# Patient Record
Sex: Female | Born: 1965 | Race: Black or African American | Hispanic: No | Marital: Married | Smoking: Former smoker
Health system: Southern US, Community
[De-identification: ages and names within clinical notes are randomized; demographics above are authoritative.]

## PROBLEM LIST (undated history)

## (undated) ENCOUNTER — Ambulatory Visit: Admission: EM

## (undated) DIAGNOSIS — R7303 Prediabetes: Secondary | ICD-10-CM

## (undated) DIAGNOSIS — R51 Headache: Secondary | ICD-10-CM

## (undated) DIAGNOSIS — M5030 Other cervical disc degeneration, unspecified cervical region: Secondary | ICD-10-CM

## (undated) DIAGNOSIS — L309 Dermatitis, unspecified: Secondary | ICD-10-CM

## (undated) DIAGNOSIS — D649 Anemia, unspecified: Secondary | ICD-10-CM

## (undated) DIAGNOSIS — Z9884 Bariatric surgery status: Secondary | ICD-10-CM

## (undated) DIAGNOSIS — G4730 Sleep apnea, unspecified: Secondary | ICD-10-CM

## (undated) DIAGNOSIS — T7840XA Allergy, unspecified, initial encounter: Secondary | ICD-10-CM

## (undated) DIAGNOSIS — R011 Cardiac murmur, unspecified: Secondary | ICD-10-CM

## (undated) DIAGNOSIS — R519 Headache, unspecified: Secondary | ICD-10-CM

## (undated) DIAGNOSIS — M7542 Impingement syndrome of left shoulder: Secondary | ICD-10-CM

## (undated) DIAGNOSIS — M1990 Unspecified osteoarthritis, unspecified site: Secondary | ICD-10-CM

## (undated) DIAGNOSIS — E559 Vitamin D deficiency, unspecified: Secondary | ICD-10-CM

## (undated) DIAGNOSIS — E785 Hyperlipidemia, unspecified: Secondary | ICD-10-CM

## (undated) DIAGNOSIS — M75120 Complete rotator cuff tear or rupture of unspecified shoulder, not specified as traumatic: Secondary | ICD-10-CM

## (undated) HISTORY — DX: Unspecified osteoarthritis, unspecified site: M19.90

## (undated) HISTORY — DX: Headache: R51

## (undated) HISTORY — DX: Bariatric surgery status: Z98.84

## (undated) HISTORY — PX: TONSILLECTOMY: SUR1361

## (undated) HISTORY — DX: Cardiac murmur, unspecified: R01.1

## (undated) HISTORY — PX: GASTRIC BYPASS: SHX52

## (undated) HISTORY — PX: ABDOMINAL HYSTERECTOMY: SHX81

## (undated) HISTORY — DX: Headache, unspecified: R51.9

## (undated) HISTORY — PX: OTHER SURGICAL HISTORY: SHX169

## (undated) HISTORY — DX: Allergy, unspecified, initial encounter: T78.40XA

---

## 2001-12-31 HISTORY — PX: GASTRIC BYPASS: SHX52

## 2005-06-28 ENCOUNTER — Other Ambulatory Visit: Payer: Self-pay

## 2005-06-28 ENCOUNTER — Emergency Department: Payer: Self-pay | Admitting: Emergency Medicine

## 2007-05-14 ENCOUNTER — Ambulatory Visit: Payer: Self-pay | Admitting: Internal Medicine

## 2007-07-23 ENCOUNTER — Ambulatory Visit: Payer: Self-pay | Admitting: Internal Medicine

## 2008-05-31 ENCOUNTER — Ambulatory Visit: Payer: Self-pay | Admitting: Unknown Physician Specialty

## 2008-06-10 ENCOUNTER — Ambulatory Visit: Payer: Self-pay | Admitting: Unknown Physician Specialty

## 2008-10-10 ENCOUNTER — Emergency Department: Payer: Self-pay | Admitting: Emergency Medicine

## 2009-08-04 ENCOUNTER — Ambulatory Visit: Payer: Self-pay | Admitting: Unknown Physician Specialty

## 2009-08-09 ENCOUNTER — Ambulatory Visit: Payer: Self-pay | Admitting: Unknown Physician Specialty

## 2010-02-16 ENCOUNTER — Ambulatory Visit: Payer: Self-pay | Admitting: Unknown Physician Specialty

## 2010-05-08 ENCOUNTER — Ambulatory Visit: Payer: Self-pay | Admitting: Rheumatology

## 2010-08-09 ENCOUNTER — Ambulatory Visit: Payer: Self-pay | Admitting: Unknown Physician Specialty

## 2011-02-12 ENCOUNTER — Ambulatory Visit: Payer: Self-pay | Admitting: General Surgery

## 2011-02-15 ENCOUNTER — Ambulatory Visit: Payer: Self-pay | Admitting: General Surgery

## 2011-07-09 ENCOUNTER — Emergency Department: Payer: Self-pay | Admitting: Emergency Medicine

## 2011-08-21 ENCOUNTER — Ambulatory Visit: Payer: Self-pay | Admitting: General Surgery

## 2012-02-11 ENCOUNTER — Ambulatory Visit (INDEPENDENT_AMBULATORY_CARE_PROVIDER_SITE_OTHER): Payer: BC Managed Care – PPO | Admitting: Family Medicine

## 2012-02-11 ENCOUNTER — Encounter: Payer: Self-pay | Admitting: Family Medicine

## 2012-02-11 VITALS — BP 120/72 | HR 99 | Temp 98.6°F | Ht 67.0 in | Wt 276.0 lb

## 2012-02-11 DIAGNOSIS — Z9884 Bariatric surgery status: Secondary | ICD-10-CM

## 2012-02-11 DIAGNOSIS — M542 Cervicalgia: Secondary | ICD-10-CM

## 2012-02-11 DIAGNOSIS — M549 Dorsalgia, unspecified: Secondary | ICD-10-CM

## 2012-02-11 MED ORDER — TRAMADOL HCL 50 MG PO TABS: 50.0000 mg | ORAL_TABLET | Freq: Four times a day (QID) | ORAL | Status: AC | PRN

## 2012-02-11 MED ORDER — AMITRIPTYLINE HCL 50 MG PO TABS: 50.0000 mg | ORAL_TABLET | Freq: Every day | ORAL | Status: DC

## 2012-02-11 MED ORDER — PREDNISONE 10 MG PO TABS: ORAL_TABLET | ORAL | Status: DC

## 2012-02-11 MED ORDER — TIZANIDINE HCL 4 MG PO TABS: 4.0000 mg | ORAL_TABLET | Freq: Every evening | ORAL | Status: AC

## 2012-02-11 NOTE — Patient Instructions (Signed)
REFERRAL: GO THE THE FRONT ROOM AT THE ENTRANCE OF OUR CLINIC, NEAR CHECK IN. ASK FOR MARION. SHE WILL HELP YOU SET UP YOUR REFERRAL. DATE: TIME:  
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
## 2012-02-12 ENCOUNTER — Encounter: Payer: Self-pay | Admitting: Family Medicine

## 2012-02-12 DIAGNOSIS — M542 Cervicalgia: Secondary | ICD-10-CM | POA: Insufficient documentation

## 2012-02-12 DIAGNOSIS — M549 Dorsalgia, unspecified: Secondary | ICD-10-CM | POA: Insufficient documentation

## 2012-02-12 DIAGNOSIS — Z9884 Bariatric surgery status: Secondary | ICD-10-CM | POA: Insufficient documentation

## 2012-02-15 ENCOUNTER — Ambulatory Visit: Payer: Self-pay | Admitting: Family Medicine

## 2012-02-18 ENCOUNTER — Encounter: Payer: Self-pay | Admitting: Family Medicine

## 2012-02-20 ENCOUNTER — Other Ambulatory Visit: Payer: Self-pay | Admitting: Family Medicine

## 2012-02-20 DIAGNOSIS — M542 Cervicalgia: Secondary | ICD-10-CM

## 2012-02-20 DIAGNOSIS — M5020 Other cervical disc displacement, unspecified cervical region: Secondary | ICD-10-CM

## 2012-02-26 ENCOUNTER — Other Ambulatory Visit: Payer: Self-pay | Admitting: Neurosurgery

## 2012-03-03 ENCOUNTER — Encounter (HOSPITAL_COMMUNITY): Payer: Self-pay | Admitting: Pharmacy Technician

## 2012-03-04 ENCOUNTER — Encounter (HOSPITAL_COMMUNITY)
Admission: RE | Admit: 2012-03-04 | Discharge: 2012-03-04 | Disposition: A | Discharge status: Home / Self Care | Payer: BC Managed Care – PPO | Source: Ambulatory Visit | Attending: Neurosurgery | Admitting: Neurosurgery

## 2012-03-04 ENCOUNTER — Encounter (HOSPITAL_COMMUNITY): Payer: Self-pay

## 2012-03-04 LAB — BASIC METABOLIC PANEL
BUN: 13 mg/dL (ref 6–23)
CO2: 27 mEq/L (ref 19–32)
Calcium: 9.2 mg/dL (ref 8.4–10.5)
Creatinine, Ser: 0.81 mg/dL (ref 0.50–1.10)
Glucose, Bld: 81 mg/dL (ref 70–99)

## 2012-03-04 LAB — CBC
MCH: 26.9 pg (ref 26.0–34.0)
MCHC: 33.2 g/dL (ref 30.0–36.0)
MCV: 80.9 fL (ref 78.0–100.0)
Platelets: 252 10*3/uL (ref 150–400)
RBC: 4.24 MIL/uL (ref 3.87–5.11)
RDW: 15.4 % (ref 11.5–15.5)

## 2012-03-04 LAB — SURGICAL PCR SCREEN
MRSA, PCR: NEGATIVE
Staphylococcus aureus: NEGATIVE

## 2012-03-04 NOTE — Pre-Procedure Instructions (Signed)
20 Monique Patton  03/04/2012   Your procedure is scheduled on:  MARCH 14  Report to [facility] at 0530 AM.  Call this number if you have problems the morning of surgery: [phone]   Remember:   Do not eat food:After Midnight.  May have clear liquids: up to 4 Hours before arrival.  Clear liquids include soda, tea, black coffee, apple or grape juice, broth.  Take these medicines the morning of surgery with A SIP OF WATER: ULTRAM AS DESIRED   Do not wear jewelry, make-up or nail polish.  Do not wear lotions, powders, or perfumes. You may wear deodorant.  Do not shave 48 hours prior to surgery.  Do not bring valuables to the hospital.  Contacts, dentures or bridgework may not be worn into surgery.  Leave suitcase in the car. After surgery it may be brought to your room.  For patients admitted to the hospital, checkout time is 11:00 AM the day of discharge.   Patients discharged the day of surgery will not be allowed to drive home.  Name and phone number of your driver: FAMILY  Special Instructions: CHG Shower Use Special Wash: 1/2 bottle night before surgery and 1/2 bottle morning of surgery.   Please read over the following fact sheets that you were given: MRSA Information and Surgical Site Infection Prevention   NO ASPIRIN OR BLOOD THINNERS

## 2012-03-12 MED ORDER — VANCOMYCIN HCL 1000 MG IV SOLR
1500.0000 mg | INTRAVENOUS | Status: AC
  Administered 2012-03-13: 1500 mg via INTRAVENOUS
  Filled 2012-03-12: qty 1500

## 2012-03-13 ENCOUNTER — Encounter (HOSPITAL_COMMUNITY)
Admission: RE | Disposition: A | Discharge status: Home / Self Care | Payer: Self-pay | Source: Ambulatory Visit | Attending: Neurosurgery

## 2012-03-13 ENCOUNTER — Encounter (HOSPITAL_COMMUNITY): Payer: Self-pay | Admitting: Critical Care Medicine

## 2012-03-13 ENCOUNTER — Ambulatory Visit (HOSPITAL_COMMUNITY): Payer: BC Managed Care – PPO | Admitting: Critical Care Medicine

## 2012-03-13 ENCOUNTER — Encounter (HOSPITAL_COMMUNITY): Payer: Self-pay | Admitting: *Deleted

## 2012-03-13 ENCOUNTER — Ambulatory Visit (HOSPITAL_COMMUNITY): Payer: BC Managed Care – PPO

## 2012-03-13 ENCOUNTER — Ambulatory Visit (HOSPITAL_COMMUNITY)
Admission: RE | Admit: 2012-03-13 | Discharge: 2012-03-14 | Disposition: A | Discharge status: Home / Self Care | Payer: BC Managed Care – PPO | Source: Ambulatory Visit | Attending: Neurosurgery | Admitting: Neurosurgery

## 2012-03-13 DIAGNOSIS — M5020 Other cervical disc displacement, unspecified cervical region: Secondary | ICD-10-CM | POA: Insufficient documentation

## 2012-03-13 DIAGNOSIS — Z01812 Encounter for preprocedural laboratory examination: Secondary | ICD-10-CM | POA: Insufficient documentation

## 2012-03-13 DIAGNOSIS — M542 Cervicalgia: Secondary | ICD-10-CM

## 2012-03-13 DIAGNOSIS — F17200 Nicotine dependence, unspecified, uncomplicated: Secondary | ICD-10-CM | POA: Insufficient documentation

## 2012-03-13 DIAGNOSIS — M47812 Spondylosis without myelopathy or radiculopathy, cervical region: Secondary | ICD-10-CM | POA: Insufficient documentation

## 2012-03-13 HISTORY — PX: ANTERIOR CERVICAL DECOMP/DISCECTOMY FUSION: SHX1161

## 2012-03-13 SURGERY — ANTERIOR CERVICAL DECOMPRESSION/DISCECTOMY FUSION 2 LEVELS
Anesthesia: General | Wound class: Clean

## 2012-03-13 MED ORDER — LACTATED RINGERS IV SOLN
INTRAVENOUS | Status: DC | PRN
  Administered 2012-03-13 (×2): via INTRAVENOUS

## 2012-03-13 MED ORDER — DEXAMETHASONE SODIUM PHOSPHATE 4 MG/ML IJ SOLN
INTRAMUSCULAR | Status: DC | PRN
  Administered 2012-03-13: 4 mg via INTRAVENOUS

## 2012-03-13 MED ORDER — SODIUM CHLORIDE 0.9 % IJ SOLN: 3.0000 mL | INTRAMUSCULAR | Status: DC | PRN

## 2012-03-13 MED ORDER — BISACODYL 10 MG RE SUPP: 10.0000 mg | Freq: Every day | RECTAL | Status: DC | PRN

## 2012-03-13 MED ORDER — GENTAMICIN IN SALINE 1.6-0.9 MG/ML-% IV SOLN
80.0000 mg | INTRAVENOUS | Status: DC
  Filled 2012-03-13: qty 50

## 2012-03-13 MED ORDER — PHENYLEPHRINE HCL 10 MG/ML IJ SOLN
INTRAMUSCULAR | Status: DC | PRN
Start: 2012-03-13 — End: 2012-03-13
  Administered 2012-03-13: 80 ug via INTRAVENOUS

## 2012-03-13 MED ORDER — THROMBIN 5000 UNITS EX SOLR
CUTANEOUS | Status: DC | PRN
  Administered 2012-03-13: 5000 [IU] via TOPICAL

## 2012-03-13 MED ORDER — DEXTROSE-NACL 5-0.45 % IV SOLN
INTRAVENOUS | Status: DC
  Administered 2012-03-13: 12:00:00 via INTRAVENOUS

## 2012-03-13 MED ORDER — LACTATED RINGERS IV SOLN: INTRAVENOUS | Status: DC

## 2012-03-13 MED ORDER — ZOLPIDEM TARTRATE 5 MG PO TABS: 10.0000 mg | ORAL_TABLET | Freq: Every evening | ORAL | Status: DC | PRN

## 2012-03-13 MED ORDER — SODIUM CHLORIDE 0.9 % IV SOLN
INTRAVENOUS | Status: DC | PRN
  Administered 2012-03-13: 08:00:00 via INTRAVENOUS

## 2012-03-13 MED ORDER — OXYCODONE-ACETAMINOPHEN 5-325 MG PO TABS
1.0000 | ORAL_TABLET | ORAL | Status: DC | PRN
Start: 2012-03-13 — End: 2012-03-14

## 2012-03-13 MED ORDER — VECURONIUM BROMIDE 10 MG IV SOLR
INTRAVENOUS | Status: DC | PRN
  Administered 2012-03-13: 2 mg via INTRAVENOUS
  Administered 2012-03-13: 1 mg via INTRAVENOUS

## 2012-03-13 MED ORDER — KETOROLAC TROMETHAMINE 30 MG/ML IJ SOLN
INTRAMUSCULAR | Status: AC
  Administered 2012-03-13: 30 mg via INTRAVENOUS
  Filled 2012-03-13: qty 1

## 2012-03-13 MED ORDER — ACETAMINOPHEN 650 MG RE SUPP: 650.0000 mg | RECTAL | Status: DC | PRN

## 2012-03-13 MED ORDER — CYCLOBENZAPRINE HCL 10 MG PO TABS
10.0000 mg | ORAL_TABLET | Freq: Three times a day (TID) | ORAL | Status: DC | PRN
  Administered 2012-03-13: 10 mg via ORAL
  Filled 2012-03-13: qty 1

## 2012-03-13 MED ORDER — LIDOCAINE-EPINEPHRINE 1 %-1:100000 IJ SOLN
INTRAMUSCULAR | Status: DC | PRN
  Administered 2012-03-13: 5 mL

## 2012-03-13 MED ORDER — MORPHINE SULFATE 4 MG/ML IJ SOLN: 4.0000 mg | INTRAMUSCULAR | Status: DC | PRN

## 2012-03-13 MED ORDER — BUPIVACAINE HCL (PF) 0.25 % IJ SOLN
INTRAMUSCULAR | Status: DC | PRN
  Administered 2012-03-13: 5 mL

## 2012-03-13 MED ORDER — ALUM & MAG HYDROXIDE-SIMETH 200-200-20 MG/5ML PO SUSP: 30.0000 mL | Freq: Four times a day (QID) | ORAL | Status: DC | PRN

## 2012-03-13 MED ORDER — ROCURONIUM BROMIDE 100 MG/10ML IV SOLN
INTRAVENOUS | Status: DC | PRN
  Administered 2012-03-13: 50 mg via INTRAVENOUS

## 2012-03-13 MED ORDER — SODIUM CHLORIDE 0.9 % IV SOLN
INTRAVENOUS | Status: AC
  Filled 2012-03-13: qty 500

## 2012-03-13 MED ORDER — KETOROLAC TROMETHAMINE 30 MG/ML IJ SOLN
30.0000 mg | Freq: Once | INTRAMUSCULAR | Status: AC
  Administered 2012-03-13: 30 mg via INTRAVENOUS

## 2012-03-13 MED ORDER — MENTHOL 3 MG MT LOZG: 1.0000 | LOZENGE | OROMUCOSAL | Status: DC | PRN

## 2012-03-13 MED ORDER — GENTAMICIN IN SALINE 1.6-0.9 MG/ML-% IV SOLN: 80.0000 mg | INTRAVENOUS | Status: DC

## 2012-03-13 MED ORDER — BACITRACIN 50000 UNITS IM SOLR
INTRAMUSCULAR | Status: AC
  Filled 2012-03-13: qty 1

## 2012-03-13 MED ORDER — SODIUM CHLORIDE 0.9 % IV SOLN: INTRAVENOUS | Status: DC

## 2012-03-13 MED ORDER — KETOROLAC TROMETHAMINE 30 MG/ML IJ SOLN
30.0000 mg | Freq: Four times a day (QID) | INTRAMUSCULAR | Status: DC
  Administered 2012-03-13 – 2012-03-14 (×3): 30 mg via INTRAVENOUS
  Filled 2012-03-13 (×7): qty 1

## 2012-03-13 MED ORDER — FENTANYL CITRATE 0.05 MG/ML IJ SOLN
INTRAMUSCULAR | Status: DC | PRN
  Administered 2012-03-13 (×3): 50 ug via INTRAVENOUS
  Administered 2012-03-13: 100 ug via INTRAVENOUS
  Administered 2012-03-13: 50 ug via INTRAVENOUS

## 2012-03-13 MED ORDER — ACETAMINOPHEN 325 MG PO TABS: 650.0000 mg | ORAL_TABLET | ORAL | Status: DC | PRN

## 2012-03-13 MED ORDER — HYDROXYZINE HCL 25 MG PO TABS: 50.0000 mg | ORAL_TABLET | ORAL | Status: DC | PRN

## 2012-03-13 MED ORDER — HYDROMORPHONE HCL PF 1 MG/ML IJ SOLN
0.2500 mg | INTRAMUSCULAR | Status: DC | PRN
  Administered 2012-03-13 (×4): 0.5 mg via INTRAVENOUS

## 2012-03-13 MED ORDER — HYDROCODONE-ACETAMINOPHEN 5-325 MG PO TABS
1.0000 | ORAL_TABLET | ORAL | Status: DC | PRN
  Administered 2012-03-13 – 2012-03-14 (×3): 2 via ORAL
  Filled 2012-03-13 (×3): qty 2

## 2012-03-13 MED ORDER — BUTALBITAL-APAP-CAFFEINE 50-325-40 MG PO TABS
1.0000 | ORAL_TABLET | ORAL | Status: DC | PRN
  Filled 2012-03-13: qty 1

## 2012-03-13 MED ORDER — BUTALBITAL-ACETAMINOPHEN 50-650 MG PO TABS: 50.0000 mg | ORAL_TABLET | Freq: Four times a day (QID) | ORAL | Status: DC | PRN

## 2012-03-13 MED ORDER — GLYCOPYRROLATE 0.2 MG/ML IJ SOLN
INTRAMUSCULAR | Status: DC | PRN
  Administered 2012-03-13: .4 mg via INTRAVENOUS

## 2012-03-13 MED ORDER — HEMOSTATIC AGENTS (NO CHARGE) OPTIME
TOPICAL | Status: DC | PRN
  Administered 2012-03-13: 1 via TOPICAL

## 2012-03-13 MED ORDER — ONDANSETRON HCL 4 MG/2ML IJ SOLN
INTRAMUSCULAR | Status: DC | PRN
  Administered 2012-03-13: 4 mg via INTRAVENOUS

## 2012-03-13 MED ORDER — SODIUM CHLORIDE 0.9 % IR SOLN
Status: DC | PRN
  Administered 2012-03-13: 09:00:00

## 2012-03-13 MED ORDER — NEOSTIGMINE METHYLSULFATE 1 MG/ML IJ SOLN
INTRAMUSCULAR | Status: DC | PRN
  Administered 2012-03-13: 3 mg via INTRAVENOUS

## 2012-03-13 MED ORDER — HYDROMORPHONE HCL PF 1 MG/ML IJ SOLN
INTRAMUSCULAR | Status: AC
Start: 2012-03-13 — End: 2012-03-13
  Administered 2012-03-13: 0.5 mg via INTRAVENOUS
  Filled 2012-03-13: qty 1

## 2012-03-13 MED ORDER — SODIUM CHLORIDE 0.9 % IJ SOLN
3.0000 mL | Freq: Two times a day (BID) | INTRAMUSCULAR | Status: DC
  Administered 2012-03-13: 3 mL via INTRAVENOUS

## 2012-03-13 MED ORDER — PHENOL 1.4 % MT LIQD: 1.0000 | OROMUCOSAL | Status: DC | PRN

## 2012-03-13 MED ORDER — MAGNESIUM HYDROXIDE 400 MG/5ML PO SUSP: 30.0000 mL | Freq: Every day | ORAL | Status: DC | PRN

## 2012-03-13 MED ORDER — HYDROXYZINE HCL 50 MG/ML IM SOLN: 50.0000 mg | INTRAMUSCULAR | Status: DC | PRN

## 2012-03-13 MED ORDER — PROPOFOL 10 MG/ML IV EMUL
INTRAVENOUS | Status: DC | PRN
  Administered 2012-03-13: 170 mg via INTRAVENOUS

## 2012-03-13 MED ORDER — LORATADINE 10 MG PO TABS
10.0000 mg | ORAL_TABLET | Freq: Every morning | ORAL | Status: DC
  Administered 2012-03-14: 10 mg via ORAL
  Filled 2012-03-13 (×2): qty 1

## 2012-03-13 MED ORDER — 0.9 % SODIUM CHLORIDE (POUR BTL) OPTIME
TOPICAL | Status: DC | PRN
  Administered 2012-03-13: 1000 mL

## 2012-03-13 MED ORDER — AMITRIPTYLINE HCL 50 MG PO TABS
50.0000 mg | ORAL_TABLET | Freq: Every day | ORAL | Status: DC
  Administered 2012-03-13: 50 mg via ORAL
  Filled 2012-03-13 (×2): qty 1

## 2012-03-13 SURGICAL SUPPLY — 54 items
ALLOGRAFT 7X14X11 (Bone Implant) ×4 IMPLANT
BAG DECANTER FOR FLEXI CONT (MISCELLANEOUS) ×2 IMPLANT
BIT DRILL NEURO 2X3.1 SFT TUCH (MISCELLANEOUS) ×1 IMPLANT
BLADE ULTRA TIP 2M (BLADE) ×2 IMPLANT
BRUSH SCRUB EZ PLAIN DRY (MISCELLANEOUS) ×2 IMPLANT
CANISTER SUCTION 2500CC (MISCELLANEOUS) ×2 IMPLANT
CLOTH BEACON ORANGE TIMEOUT ST (SAFETY) ×2 IMPLANT
CONT SPEC 4OZ CLIKSEAL STRL BL (MISCELLANEOUS) ×2 IMPLANT
COVER MAYO STAND STRL (DRAPES) ×2 IMPLANT
DECANTER SPIKE VIAL GLASS SM (MISCELLANEOUS) ×2 IMPLANT
DERMABOND ADVANCED (GAUZE/BANDAGES/DRESSINGS) ×1
DERMABOND ADVANCED .7 DNX12 (GAUZE/BANDAGES/DRESSINGS) ×1 IMPLANT
DRAPE LAPAROTOMY 100X72 PEDS (DRAPES) ×2 IMPLANT
DRAPE MICROSCOPE LEICA (MISCELLANEOUS) ×2 IMPLANT
DRAPE POUCH INSTRU U-SHP 10X18 (DRAPES) ×2 IMPLANT
DRAPE PROXIMA HALF (DRAPES) IMPLANT
DRILL NEURO 2X3.1 SOFT TOUCH (MISCELLANEOUS) ×2
ELECT COATED BLADE 2.86 ST (ELECTRODE) ×4 IMPLANT
ELECT REM PT RETURN 9FT ADLT (ELECTROSURGICAL) ×2
ELECTRODE REM PT RTRN 9FT ADLT (ELECTROSURGICAL) ×1 IMPLANT
GLOVE BIOGEL PI IND STRL 6.5 (GLOVE) ×1 IMPLANT
GLOVE BIOGEL PI IND STRL 7.0 (GLOVE) ×1 IMPLANT
GLOVE BIOGEL PI IND STRL 8 (GLOVE) ×1 IMPLANT
GLOVE BIOGEL PI INDICATOR 6.5 (GLOVE) ×1
GLOVE BIOGEL PI INDICATOR 7.0 (GLOVE) ×1
GLOVE BIOGEL PI INDICATOR 8 (GLOVE) ×1
GLOVE ECLIPSE 7.5 STRL STRAW (GLOVE) ×2 IMPLANT
GLOVE EXAM NITRILE LRG STRL (GLOVE) IMPLANT
GLOVE EXAM NITRILE MD LF STRL (GLOVE) ×2 IMPLANT
GLOVE EXAM NITRILE XL STR (GLOVE) IMPLANT
GLOVE EXAM NITRILE XS STR PU (GLOVE) IMPLANT
GLOVE SS BIOGEL STRL SZ 6.5 (GLOVE) ×3 IMPLANT
GLOVE SUPERSENSE BIOGEL SZ 6.5 (GLOVE) ×3
GLOVE SURG SS PI 6.5 STRL IVOR (GLOVE) ×2 IMPLANT
GOWN BRE IMP SLV AUR LG STRL (GOWN DISPOSABLE) ×6 IMPLANT
GOWN BRE IMP SLV AUR XL STRL (GOWN DISPOSABLE) ×2 IMPLANT
GOWN STRL REIN 2XL LVL4 (GOWN DISPOSABLE) IMPLANT
HEAD HALTER (SOFTGOODS) ×2 IMPLANT
KIT BASIN OR (CUSTOM PROCEDURE TRAY) ×2 IMPLANT
KIT ROOM TURNOVER OR (KITS) ×2 IMPLANT
NEEDLE HYPO 25X1 1.5 SAFETY (NEEDLE) ×2 IMPLANT
NEEDLE SPNL 22GX3.5 QUINCKE BK (NEEDLE) ×2 IMPLANT
NS IRRIG 1000ML POUR BTL (IV SOLUTION) ×2 IMPLANT
PACK LAMINECTOMY NEURO (CUSTOM PROCEDURE TRAY) ×2 IMPLANT
PAD ARMBOARD 7.5X6 YLW CONV (MISCELLANEOUS) ×6 IMPLANT
RUBBERBAND STERILE (MISCELLANEOUS) ×4 IMPLANT
SPONGE INTESTINAL PEANUT (DISPOSABLE) ×2 IMPLANT
SPONGE SURGIFOAM ABS GEL SZ50 (HEMOSTASIS) ×2 IMPLANT
SUT VIC AB 2-0 CP2 18 (SUTURE) ×2 IMPLANT
SUT VIC AB 3-0 SH 8-18 (SUTURE) ×2 IMPLANT
SYR 20ML ECCENTRIC (SYRINGE) ×2 IMPLANT
TOWEL OR 17X24 6PK STRL BLUE (TOWEL DISPOSABLE) ×2 IMPLANT
TOWEL OR 17X26 10 PK STRL BLUE (TOWEL DISPOSABLE) ×2 IMPLANT
WATER STERILE IRR 1000ML POUR (IV SOLUTION) ×2 IMPLANT

## 2012-03-13 NOTE — Op Note (Signed)
03/13/2012  10:31 AM  PATIENT:  Monique Patton  47 y.o. female  PRE-OPERATIVE DIAGNOSIS:  cervical spondylosis cervical degenerative disc disease cervical hernaited disc neck pain  POST-OPERATIVE DIAGNOSIS:  cervical spondylosis cervical degenerative disc disease cervical hernaited disc neck pain  PROCEDURE:  Procedure(s): ANTERIOR CERVICAL DECOMPRESSION/DISCECTOMY FUSION 2 LEVELS: C5-6 and C6-7 anterior cervical decompression and arthrodesis with allograft and tether cervical plating  SURGEON:  Surgeon(s): Hewitt Shorts, MD Clydene Fake, MD  ASSISTANTS: Clydene Fake, M.D.  ANESTHESIA:   general  EBL:  Total I/O In: 1700 [I.V.:1700] Out: 75 [Blood:75]  BLOOD ADMINISTERED:none  COUNT: Correct per nursing staff  DICTATION: Patient was brought to the operating room placed under general endotracheal anesthesia. Patient was placed in 10 pounds of halter traction. The neck was prepped with Betadine soap and solution and draped in a sterile fashion. A horizontal incision was made on the left side of the neck. The line of the incision was infiltrated with local anesthetic with epinephrine. Dissection was carried down thru the subcutaneous tissue and platysma, bipolar cautery was used to maintain hemostasis. Dissection was then carried out thru an avascular plane leaving the sternocleidomastoid carotid artery and jugular vein laterally and the trachea and esophagus medially. The ventral aspect of the vertebral column was identified and a localizing x-ray was taken. The C5-6 and C6-7 levels were identified. The annulus at each level was incised and the disc space entered. Discectomy was performed with micro-curettes and pituitary rongeurs. The operating microscope was draped and brought into the field provided additional magnification illumination and visualization. Discectomy was continued posteriorly thru the disc space and then the cartilaginous endplate was removed using micro-curettes  along with the high-speed drill. Posterior osteophytic overgrowth was removed each level using the high-speed drill along with a 2 mm thin footplated Kerrison punch. Posterior longitudinal ligament along with disc herniation was carefully removed, decompressing the spinal canal and thecal sac. We then continued to remove osteophytic overgrowth and disc material decompressing the neural foramina and exiting nerve roots bilaterally. Once the decompression was completed hemostasis was established at each level with the use of Gelfoam with thrombin and bipolar cautery. The Gelfoam was removed the wound irrigated and hemostasis confirmed. We then measured the height of the intravertebral disc space level and selected a 7 millimeter in height structural allograft for the C5-6 level and a 7 millimeter in height structural allograft for the C6-7 level . Each was hydrated and saline solution and then gently positioned in the intravertebral disc space and countersunk. We then selected a 29 millimeter in height Tether cervical plate. It was positioned over the fusion construct and secured to the vertebra with 4 x 13 mm screws. Each screw hole was started with the high-speed drill and then the screws placed, once all the screws were placed final tightening was performed. The wound was irrigated with bacitracin solution checked for hemostasis which was established and confirmed. An x-ray was taken which showed grafts in good position, the plate in good position and the screws in good position and the overall alignment was good . We then proceeded with closure. The platysma was closed with interrupted inverted 2-0 undyed Vicryl suture, the subcutaneous and subcuticular closed with interrupted inverted 3-0 undyed Vicryl suture. The skin edges were approximated with Dermabond. Following surgery the patient was taken out of cervical traction. To be reversed and the anesthetic and taken to the recovery room for further  care.   PLAN OF CARE: Admit for  overnight observation  PATIENT DISPOSITION:  PACU - hemodynamically stable.   Delay start of Pharmacological VTE agent (>24hrs) due to surgical blood loss or risk of bleeding:  yes

## 2012-03-13 NOTE — Anesthesia Postprocedure Evaluation (Signed)
  Anesthesia Post-op Note  Patient: Monique Patton  Procedure(s) Performed: Procedure(s) (LRB): ANTERIOR CERVICAL DECOMPRESSION/DISCECTOMY FUSION 2 LEVELS (N/A)  Patient Location: PACU  Anesthesia Type: General  Level of Consciousness: awake  Airway and Oxygen Therapy: Patient Spontanous Breathing  Post-op Pain: mild  Post-op Assessment: Post-op Vital signs reviewed  Post-op Vital Signs: stable  Complications: No apparent anesthesia complications

## 2012-03-13 NOTE — H&P (Signed)
Subjective: Patient is a 47 y.o. female who is admitted for treatment of multilevel cervical disc herniations, spondylosis, and degenerative disc disease, with resulting neck pain extending into the shoulders and interscapular region bilaterally. Symptoms date back to July 2012. She's undergone extensive nonsurgical management without relief or improvement and is admitted now for a 2 level C5-6 and C6-7 ACDF.    Patient Active Problem List  Diagnoses Date Noted  . 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 Neck pain 02/12/2012  . Back pain 02/12/2012  . H/O gastric bypass    Past Medical History  Diagnosis Date  . Arthritis   . Persistent headaches   . Allergy   . H/O gastric bypass     Past Surgical History  Procedure Date  . Gastric bypass   . Abdominal hysterectomy   . Adenidectomy   . Tonsillectomy     Prescriptions prior to admission  Medication Sig Dispense Refill  . amitriptyline (ELAVIL) 50 MG tablet Take 1 tablet (50 mg total) by mouth at bedtime.  30 tablet  3  . BUPAP 50-650 MG TABS Take 50-650 mg by mouth Every 6 hours as needed. For headache      . loratadine (CLARITIN) 10 MG tablet Take 10 mg by mouth every morning.      Marland Kitchen tiZANidine (ZANAFLEX) 4 MG tablet Take 4 mg by mouth at bedtime.      . traMADol (ULTRAM) 50 MG tablet Take 50 mg by mouth every 6 (six) hours as needed. For pain       Allergies  Allergen Reactions  . Fish Allergy   . Penicillins     History  Substance Use Topics  . Smoking status: Current Everyday Smoker  . Smokeless tobacco: Not on file  . Alcohol Use: No    Family History  Problem Relation Age of Onset  . Hyperlipidemia Father   . Hypertension Father   . Diabetes Father      Review of Systems A comprehensive review of systems was negative.  Objective: Vital signs in last 24 hours: Temp:  [97.9 F (36.6 C)] 97.9 F (36.6 C) (03/14 1610) Pulse Rate:  [82] 82  (03/14 0608) Resp:  [20] 20  (03/14 0608) BP: (103)/(72) 103/72  mmHg (03/14 0608) SpO2:  [100 %] 100 % (03/14 9604)  EXAM: Patient is a well-developed well-nourished black female in no acute distress. Lungs are clear to auscultation , the patient has symmetrical respiratory excursion. Heart has a regular rate and rhythm normal S1 and S2 no murmur.   Abdomen is soft nontender nondistended bowel sounds are present. Extremity examination shows no clubbing cyanosis or edema. Musculoskeletal examination shows mild diffuse tenderness to palpation in the posterior cervical region, without specific point tenderness. She has good range of motion neck flexion extension lateral flexion to either side.  Motor examination shows 5 over 5 strength in the upper extremities including the deltoid biceps triceps and intrinsics and grip. Sensation is intact to pinprick throughout the digits of the upper extremities. Reflexes are symmetrical and without evidence of pathologic reflexes. Patient has a normal gait and stance.   Data Review:CBC    Component Value Date/Time   WBC 7.4 03/04/2012 1530   RBC 4.24 03/04/2012 1530   HGB 11.4* 03/04/2012 1530   HCT 34.3* 03/04/2012 1530   PLT 252 03/04/2012 1530   MCV 80.9 03/04/2012 1530   MCH 26.9 03/04/2012 1530   MCHC 33.2 03/04/2012 1530   RDW 15.4 03/04/2012 1530  BMET    Component Value Date/Time   NA 141 03/04/2012 1530   K 5.0 03/04/2012 1530   CL 108 03/04/2012 1530   CO2 27 03/04/2012 1530   GLUCOSE 81 03/04/2012 1530   BUN 13 03/04/2012 1530   CREATININE 0.81 03/04/2012 1530   CALCIUM 9.2 03/04/2012 1530   GFRNONAA 85* 03/04/2012 1530   GFRAA >90 03/04/2012 1530     Assessment/Plan: Patient with multilevel cervical disc herniations, degenerative disc disease, and spondylosis, with the worst degenerative changes seen at the C5-6 and C6-7 levels was admitted now for an anterior cervical decompression and arthrodesis with allograft and cervical plating at those levels. I've discussed with the patient the nature of his  condition, the nature the surgical procedure, the typical length of surgery, hospital stay, and overall recuperation. We discussed limitations postoperatively. I discussed risks of surgery including risks of infection, bleeding, possibly need for transfusion, the risk of nerve root dysfunction with pain, weakness, numbness, or paresthesias, the risk of spinal cord dysfunction with paralysis of all 4 limbs and quadriplegia, and the risk of dural tear and CSF leakage and possible need for further surgery, the risk of esophageal dysfunction causing dysphagia and the risk of laryngeal dysfunction causing hoarseness of the voice, the risk of failure of the arthrodesis and the possible need for further surgery, and the risk of anesthetic complications including myocardial infarction, stroke, pneumonia, and death. We also discussed the need for postoperative immobilization in a cervical collar. Understanding all this the patient does wish to proceed with surgery and is admitted for such.    Hewitt Shorts, MD 03/13/2012 7:46 AM

## 2012-03-13 NOTE — Preoperative (Signed)
Beta Blockers   Reason not to administer Beta Blockers:Not Applicable, pt not on beta blocker 

## 2012-03-13 NOTE — Anesthesia Preprocedure Evaluation (Addendum)
Anesthesia Evaluation  Patient identified by MRN, date of birth, ID band Patient awake    Reviewed: Allergy & Precautions, H&P , NPO status , Patient's Chart, lab work & pertinent test results  Airway Mallampati: II TM Distance: >3 FB Neck ROM: Full    Dental  (+) Edentulous Upper and Edentulous Lower   Pulmonary Current Smoker,          Cardiovascular negative cardio ROS  Rhythm:Regular Rate:Normal     Neuro/Psych  Headaches,    GI/Hepatic negative GI ROS, Neg liver ROS,   Endo/Other  negative endocrine ROSMorbid obesity  Renal/GU      Musculoskeletal  (+) Arthritis -,   Abdominal   Peds  Hematology negative hematology ROS (+)   Anesthesia Other Findings   Reproductive/Obstetrics                         Anesthesia Physical Anesthesia Plan  ASA: II  Anesthesia Plan: General   Post-op Pain Management:    Induction: Intravenous  Airway Management Planned: Oral ETT  Additional Equipment:   Intra-op Plan:   Post-operative Plan: Extubation in OR  Informed Consent: I have reviewed the patients History and Physical, chart, labs and discussed the procedure including the risks, benefits and alternatives for the proposed anesthesia with the patient or authorized representative who has indicated his/her understanding and acceptance.   Dental advisory given  Plan Discussed with: CRNA, Anesthesiologist and Surgeon  Anesthesia Plan Comments:         Anesthesia Quick Evaluation

## 2012-03-13 NOTE — Progress Notes (Signed)
Filed Vitals:   03/13/12 1145 03/13/12 1148 03/13/12 1152 03/13/12 1210  BP:    130/84  Pulse: 86  88 87  Temp:   97.5 F (36.4 C) 98.1 F (36.7 C)  TempSrc:      Resp: 16 12 12 16   SpO2: 100%  100% 96%    Patient has been up and in good. She does have some mild nausea with her meals. Wound is clean and dry. Moving all short as well. Voiding well.  Plan: Encouraged continued to ambulate. We'll continue to progress to the postoperative recovery.  Hewitt Shorts, MD 03/13/2012, 2:24 PM  told he is arw

## 2012-03-13 NOTE — Transfer of Care (Signed)
Immediate Anesthesia Transfer of Care Note  Patient: Monique Patton  Procedure(s) Performed: Procedure(s) (LRB): ANTERIOR CERVICAL DECOMPRESSION/DISCECTOMY FUSION 2 LEVELS (N/A)  Patient Location: PACU  Anesthesia Type: General  Level of Consciousness: awake, alert  and oriented  Airway & Oxygen Therapy: Patient Spontanous Breathing and Patient connected to nasal cannula oxygen  Post-op Assessment: Report given to PACU RN, Post -op Vital signs reviewed and stable and Patient moving all extremities X 4  Post vital signs: Reviewed and stable  Complications: No apparent anesthesia complications

## 2012-03-13 NOTE — Anesthesia Procedure Notes (Signed)
Procedure Name: Intubation Date/Time: 03/13/2012 8:01 AM Performed by: Elon Alas Pre-anesthesia Checklist: Patient identified, Timeout performed, Emergency Drugs available, Suction available and Patient being monitored Patient Re-evaluated:Patient Re-evaluated prior to inductionOxygen Delivery Method: Circle system utilized Preoxygenation: Pre-oxygenation with 100% oxygen Intubation Type: IV induction and Cricoid Pressure applied Ventilation: Mask ventilation without difficulty Laryngoscope Size: Mac and 3 Grade View: Grade I Tube type: Oral Tube size: 7.5 mm Number of attempts: 1 Airway Equipment and Method: Stylet Placement Confirmation: positive ETCO2,  breath sounds checked- equal and bilateral and ETT inserted through vocal cords under direct vision Secured at: 22 cm Tube secured with: Tape Dental Injury: Teeth and Oropharynx as per pre-operative assessment

## 2012-03-14 MED ORDER — HYDROCODONE-ACETAMINOPHEN 5-325 MG PO TABS: 1.0000 | ORAL_TABLET | ORAL | Status: AC | PRN

## 2012-03-14 NOTE — Discharge Summary (Signed)
Physician Discharge Summary  Patient ID: Monique Patton MRN: [id number] DOB/AGE: 48-Sep-1966 48 y.o.  Admit date: 03/13/2012 Discharge date: 03/14/2012  Admission Diagnoses:  cervical spondylosis, cervical degenerative disc disease, cervical herniated disc, neck pain  Discharge Diagnoses:  cervical spondylosis, cervical degenerative disc disease, cervical herniated disc, neck pain  Discharged Condition: good  Hospital Course: Patient was admitted underwent a C5-6 and C6-7 anterior cervical decompression and arthrodesis with allograft and tether cervical plating. She has done well following surgery. She is up and ambulate actively in the halls. Her wound is healing well, there is no swelling erythema or drainage. She has been given instructions regarding wound care and activities following discharge. She is to return for followup with me in 3 weeks. Or sooner she has increased difficulties.  Discharge Exam: Blood pressure 112/75, pulse 91, temperature 98.3 F (36.8 C), temperature source Oral, resp. rate 16, SpO2 98.00%.  Disposition: Home   Medication List  As of 03/14/2012  8:02 AM   TAKE these medications         amitriptyline 50 MG tablet   Commonly known as: ELAVIL   Take 1 tablet (50 mg total) by mouth at bedtime.      BUPAP 50-650 MG Tabs   Generic drug: ACETAMINOPHEN-BUTALBITAL   Take 50-650 mg by mouth Every 6 hours as needed. For headache      HYDROcodone-acetaminophen 5-325 MG per tablet   Commonly known as: NORCO   Take 1-2 tablets by mouth every 4 (four) hours as needed.      loratadine 10 MG tablet   Commonly known as: CLARITIN   Take 10 mg by mouth every morning.      tiZANidine 4 MG tablet   Commonly known as: ZANAFLEX   Take 4 mg by mouth at bedtime.      traMADol 50 MG tablet   Commonly known as: ULTRAM   Take 50 mg by mouth every 6 (six) hours as needed. For pain             Signed: Hewitt Shorts, MD 03/14/2012, 8:02 AM

## 2012-03-14 NOTE — Discharge Instructions (Signed)
Wound Care °Leave incision open to air. °You may shower. °Do not scrub directly on incision.  °Do not put any creams, lotions, or ointments on incision. °Activity °Walk each and every day, increasing distance each day. °No lifting greater than 5 lbs.  Avoid excessive neck motion. °No driving for 2 weeks; may ride as a passenger locally. °Wear neck brace at all times except when showering.  If provided soft collar, may wear for comfort unless otherwise instructed. °Diet °Resume your normal diet.  °Return to Work °Will be discussed at you follow up appointment. °Call Your Doctor If Any of These Occur °Redness, drainage, or swelling at the wound.  °Temperature greater than 101 degrees. °Severe pain not relieved by pain medication. °Increased difficulty swallowing. °Incision starts to come apart. °Follow Up Appt °Call today for appointment in 3 weeks ([phone]) or for problems.  If you have any hardware placed in your spine, you will need an x-ray before your appointment. °  ° °

## 2012-03-17 ENCOUNTER — Encounter (HOSPITAL_COMMUNITY): Payer: Self-pay | Admitting: Neurosurgery

## 2012-07-17 ENCOUNTER — Emergency Department: Payer: Self-pay | Admitting: Emergency Medicine

## 2012-08-19 ENCOUNTER — Ambulatory Visit: Payer: Self-pay | Admitting: Neurosurgery

## 2013-04-04 ENCOUNTER — Other Ambulatory Visit: Payer: Self-pay | Admitting: Neurosurgery

## 2013-05-01 ENCOUNTER — Encounter (HOSPITAL_COMMUNITY): Payer: Self-pay | Admitting: Pharmacy Technician

## 2013-05-06 ENCOUNTER — Encounter (HOSPITAL_COMMUNITY)
Admission: RE | Admit: 2013-05-06 | Discharge: 2013-05-06 | Disposition: A | Discharge status: Home / Self Care | Payer: BC Managed Care – PPO | Source: Ambulatory Visit | Attending: Neurosurgery | Admitting: Neurosurgery

## 2013-05-06 ENCOUNTER — Encounter (HOSPITAL_COMMUNITY): Payer: Self-pay

## 2013-05-06 DIAGNOSIS — Z01812 Encounter for preprocedural laboratory examination: Secondary | ICD-10-CM | POA: Insufficient documentation

## 2013-05-06 LAB — BASIC METABOLIC PANEL
Chloride: 105 mEq/L (ref 96–112)
Creatinine, Ser: 0.84 mg/dL (ref 0.50–1.10)
GFR calc Af Amer: >90 mL/min (ref >90)
GFR calc non Af Amer: 80 mL/min — ABNORMAL LOW (ref >90)
Potassium: 4.5 mEq/L (ref 3.5–5.1)

## 2013-05-06 LAB — CBC
MCHC: 34.1 g/dL (ref 30.0–36.0)
Platelets: 318 10*3/uL (ref 150–400)
RDW: 17.4 % — ABNORMAL HIGH (ref 11.5–15.5)
WBC: 9.1 10*3/uL (ref 4.0–10.5)

## 2013-05-06 LAB — SURGICAL PCR SCREEN: MRSA, PCR: NEGATIVE

## 2013-05-06 NOTE — Pre-Procedure Instructions (Addendum)
ZIYONNA CHRISTNER  05/06/2013   Your procedure is scheduled on: 05/14/13  Report to [facility] at 530 AM.  Call this number if you have problems the morning of surgery: [phone]   Remember:   Do not eat food or drink liquids after midnight.   Take these medicines the morning of surgery with A SIP OF WATER:pain med if needed  STOP nabumetone  05/09/13   Do not wear jewelry, make-up or nail polish.  Do not wear lotions, powders, or perfumes. You may wear deodorant.  Do not shave 48 hours prior to surgery. Men may shave face and neck.  Do not bring valuables to the hospital.  Contacts, dentures or bridgework may not be worn into surgery.  Leave suitcase in the car. After surgery it may be brought to your room.  For patients admitted to the hospital, checkout time is 11:00 AM the day of  discharge.   Patients discharged the day of surgery will not be allowed to drive  home.  Name and phone number of your driver:   Special Instructions: Shower using CHG 2 nights before surgery and the night before surgery.  If you shower the day of surgery use CHG.  Use special wash - you have one bottle of CHG for all showers.  You should use approximately 1/3 of the bottle for each shower.   Please read over the following fact sheets that you were given: Pain Booklet, Coughing and Deep Breathing, MRSA Information and Surgical Site Infection Prevention

## 2013-05-14 ENCOUNTER — Ambulatory Visit (HOSPITAL_COMMUNITY): Payer: BC Managed Care – PPO | Admitting: Anesthesiology

## 2013-05-14 ENCOUNTER — Observation Stay (HOSPITAL_COMMUNITY): Payer: BC Managed Care – PPO

## 2013-05-14 ENCOUNTER — Encounter (HOSPITAL_COMMUNITY)
Admission: RE | Disposition: A | Discharge status: Home / Self Care | Payer: Self-pay | Source: Ambulatory Visit | Attending: Neurosurgery

## 2013-05-14 ENCOUNTER — Observation Stay (HOSPITAL_COMMUNITY)
Admission: RE | Admit: 2013-05-14 | Discharge: 2013-05-15 | Disposition: A | Discharge status: Home / Self Care | Payer: BC Managed Care – PPO | Source: Ambulatory Visit | Attending: Neurosurgery | Admitting: Neurosurgery

## 2013-05-14 ENCOUNTER — Encounter (HOSPITAL_COMMUNITY): Payer: Self-pay | Admitting: Anesthesiology

## 2013-05-14 ENCOUNTER — Encounter (HOSPITAL_COMMUNITY): Payer: Self-pay | Admitting: *Deleted

## 2013-05-14 DIAGNOSIS — Z9884 Bariatric surgery status: Secondary | ICD-10-CM | POA: Insufficient documentation

## 2013-05-14 DIAGNOSIS — M5126 Other intervertebral disc displacement, lumbar region: Principal | ICD-10-CM | POA: Insufficient documentation

## 2013-05-14 DIAGNOSIS — M549 Dorsalgia, unspecified: Secondary | ICD-10-CM

## 2013-05-14 DIAGNOSIS — F17200 Nicotine dependence, unspecified, uncomplicated: Secondary | ICD-10-CM | POA: Insufficient documentation

## 2013-05-14 HISTORY — PX: LUMBAR LAMINECTOMY/DECOMPRESSION MICRODISCECTOMY: SHX5026

## 2013-05-14 SURGERY — LUMBAR LAMINECTOMY/DECOMPRESSION MICRODISCECTOMY 1 LEVEL
Anesthesia: General | Site: Back | Laterality: Left | Wound class: Clean

## 2013-05-14 MED ORDER — OXYCODONE HCL 5 MG PO TABS
5.0000 mg | ORAL_TABLET | ORAL | Status: DC | PRN
  Administered 2013-05-14 (×2): 10 mg via ORAL
  Filled 2013-05-14 (×2): qty 2

## 2013-05-14 MED ORDER — GLYCOPYRROLATE 0.2 MG/ML IJ SOLN
INTRAMUSCULAR | Status: DC | PRN
  Administered 2013-05-14: 0.6 mg via INTRAVENOUS

## 2013-05-14 MED ORDER — LACTATED RINGERS IV SOLN
INTRAVENOUS | Status: DC | PRN
  Administered 2013-05-14 (×2): via INTRAVENOUS

## 2013-05-14 MED ORDER — ONDANSETRON HCL 4 MG/2ML IJ SOLN
INTRAMUSCULAR | Status: DC | PRN
  Administered 2013-05-14: 4 mg via INTRAVENOUS

## 2013-05-14 MED ORDER — 0.9 % SODIUM CHLORIDE (POUR BTL) OPTIME
TOPICAL | Status: DC | PRN
  Administered 2013-05-14: 1000 mL

## 2013-05-14 MED ORDER — ACETAMINOPHEN 650 MG RE SUPP: 650.0000 mg | RECTAL | Status: DC | PRN

## 2013-05-14 MED ORDER — SODIUM CHLORIDE 0.9 % IJ SOLN: 3.0000 mL | INTRAMUSCULAR | Status: DC | PRN

## 2013-05-14 MED ORDER — MENTHOL 3 MG MT LOZG: 1.0000 | LOZENGE | OROMUCOSAL | Status: DC | PRN

## 2013-05-14 MED ORDER — FENTANYL CITRATE 0.05 MG/ML IJ SOLN
INTRAMUSCULAR | Status: DC | PRN
  Administered 2013-05-14 (×3): 50 ug via INTRAVENOUS
  Administered 2013-05-14: 100 ug via INTRAVENOUS

## 2013-05-14 MED ORDER — BACITRACIN 50000 UNITS IM SOLR
INTRAMUSCULAR | Status: AC
  Filled 2013-05-14: qty 1

## 2013-05-14 MED ORDER — STERILE WATER FOR IRRIGATION IR SOLN
Status: DC | PRN
  Administered 2013-05-14: 1000 mL

## 2013-05-14 MED ORDER — BISACODYL 10 MG RE SUPP: 10.0000 mg | Freq: Every day | RECTAL | Status: DC | PRN

## 2013-05-14 MED ORDER — GENTAMICIN IN SALINE 1.6-0.9 MG/ML-% IV SOLN
80.0000 mg | INTRAVENOUS | Status: AC
  Administered 2013-05-14: 80 mg via INTRAVENOUS
  Filled 2013-05-14: qty 50

## 2013-05-14 MED ORDER — ACETAMINOPHEN 10 MG/ML IV SOLN
INTRAVENOUS | Status: AC
  Filled 2013-05-14: qty 100

## 2013-05-14 MED ORDER — ALUM & MAG HYDROXIDE-SIMETH 200-200-20 MG/5ML PO SUSP: 30.0000 mL | Freq: Four times a day (QID) | ORAL | Status: DC | PRN

## 2013-05-14 MED ORDER — FENTANYL CITRATE 0.05 MG/ML IJ SOLN
INTRAMUSCULAR | Status: AC
  Filled 2013-05-14: qty 2

## 2013-05-14 MED ORDER — DEXAMETHASONE SODIUM PHOSPHATE 10 MG/ML IJ SOLN
INTRAMUSCULAR | Status: DC | PRN
  Administered 2013-05-14: 8 mg via INTRAVENOUS

## 2013-05-14 MED ORDER — GABAPENTIN 300 MG PO CAPS
300.0000 mg | ORAL_CAPSULE | Freq: Two times a day (BID) | ORAL | Status: DC
  Administered 2013-05-14 (×2): 300 mg via ORAL
  Filled 2013-05-14 (×4): qty 1

## 2013-05-14 MED ORDER — CYCLOBENZAPRINE HCL 10 MG PO TABS
10.0000 mg | ORAL_TABLET | Freq: Three times a day (TID) | ORAL | Status: DC | PRN
  Administered 2013-05-14 (×2): 10 mg via ORAL
  Filled 2013-05-14 (×2): qty 1

## 2013-05-14 MED ORDER — LIDOCAINE-EPINEPHRINE 1 %-1:100000 IJ SOLN
INTRAMUSCULAR | Status: DC | PRN
  Administered 2013-05-14: 15 mL

## 2013-05-14 MED ORDER — VANCOMYCIN HCL IN DEXTROSE 1-5 GM/200ML-% IV SOLN
1000.0000 mg | INTRAVENOUS | Status: AC
  Administered 2013-05-14: 1000 mg via INTRAVENOUS

## 2013-05-14 MED ORDER — ROCURONIUM BROMIDE 100 MG/10ML IV SOLN
INTRAVENOUS | Status: DC | PRN
  Administered 2013-05-14: 50 mg via INTRAVENOUS

## 2013-05-14 MED ORDER — MAGNESIUM HYDROXIDE 400 MG/5ML PO SUSP: 30.0000 mL | Freq: Every day | ORAL | Status: DC | PRN

## 2013-05-14 MED ORDER — THROMBIN 5000 UNITS EX SOLR
CUTANEOUS | Status: DC | PRN
  Administered 2013-05-14 (×2): 5000 [IU] via TOPICAL

## 2013-05-14 MED ORDER — HEMOSTATIC AGENTS (NO CHARGE) OPTIME
TOPICAL | Status: DC | PRN
  Administered 2013-05-14: 1 via TOPICAL

## 2013-05-14 MED ORDER — ONDANSETRON HCL 4 MG/2ML IJ SOLN: 4.0000 mg | Freq: Four times a day (QID) | INTRAMUSCULAR | Status: DC | PRN

## 2013-05-14 MED ORDER — PHENOL 1.4 % MT LIQD: 1.0000 | OROMUCOSAL | Status: DC | PRN

## 2013-05-14 MED ORDER — FENTANYL CITRATE 0.05 MG/ML IJ SOLN
25.0000 ug | INTRAMUSCULAR | Status: DC | PRN
  Administered 2013-05-14 (×3): 50 ug via INTRAVENOUS

## 2013-05-14 MED ORDER — PROPOFOL 10 MG/ML IV BOLUS
INTRAVENOUS | Status: DC | PRN
  Administered 2013-05-14: 170 mg via INTRAVENOUS

## 2013-05-14 MED ORDER — ONDANSETRON 8 MG/NS 50 ML IVPB
8.0000 mg | Freq: Four times a day (QID) | INTRAVENOUS | Status: DC | PRN
  Filled 2013-05-14: qty 8

## 2013-05-14 MED ORDER — ARTIFICIAL TEARS OP OINT
TOPICAL_OINTMENT | OPHTHALMIC | Status: DC | PRN
  Administered 2013-05-14: 1 via OPHTHALMIC

## 2013-05-14 MED ORDER — KETOROLAC TROMETHAMINE 30 MG/ML IJ SOLN
30.0000 mg | Freq: Four times a day (QID) | INTRAMUSCULAR | Status: DC
  Administered 2013-05-14 – 2013-05-15 (×3): 30 mg via INTRAVENOUS
  Filled 2013-05-14 (×7): qty 1

## 2013-05-14 MED ORDER — LIDOCAINE HCL (CARDIAC) 20 MG/ML IV SOLN
INTRAVENOUS | Status: DC | PRN
  Administered 2013-05-14: 80 mg via INTRAVENOUS

## 2013-05-14 MED ORDER — SODIUM CHLORIDE 0.9 % IR SOLN
Status: DC | PRN
  Administered 2013-05-14: 09:00:00

## 2013-05-14 MED ORDER — METHYLPREDNISOLONE ACETATE 80 MG/ML IJ SUSP
INTRAMUSCULAR | Status: DC | PRN
  Administered 2013-05-14: 80 mg

## 2013-05-14 MED ORDER — ACETAMINOPHEN 10 MG/ML IV SOLN
INTRAVENOUS | Status: DC | PRN
  Administered 2013-05-14: 1000 mg via INTRAVENOUS

## 2013-05-14 MED ORDER — ACETAMINOPHEN 325 MG PO TABS: 650.0000 mg | ORAL_TABLET | ORAL | Status: DC | PRN

## 2013-05-14 MED ORDER — KETOROLAC TROMETHAMINE 30 MG/ML IJ SOLN
INTRAMUSCULAR | Status: AC
  Filled 2013-05-14: qty 1

## 2013-05-14 MED ORDER — NEOSTIGMINE METHYLSULFATE 1 MG/ML IJ SOLN
INTRAMUSCULAR | Status: DC | PRN
  Administered 2013-05-14: 4 mg via INTRAVENOUS

## 2013-05-14 MED ORDER — SODIUM CHLORIDE 0.9 % IV SOLN
INTRAVENOUS | Status: AC
  Filled 2013-05-14: qty 500

## 2013-05-14 MED ORDER — SODIUM CHLORIDE 0.9 % IV SOLN: 250.0000 mL | INTRAVENOUS | Status: DC

## 2013-05-14 MED ORDER — KETOROLAC TROMETHAMINE 30 MG/ML IJ SOLN
30.0000 mg | Freq: Once | INTRAMUSCULAR | Status: AC
  Administered 2013-05-14: 30 mg via INTRAVENOUS

## 2013-05-14 MED ORDER — HYDROMORPHONE HCL PF 1 MG/ML IJ SOLN
INTRAMUSCULAR | Status: DC | PRN
Start: 2013-05-14 — End: 2013-05-14
  Administered 2013-05-14 (×2): 0.5 mg via INTRAVENOUS

## 2013-05-14 MED ORDER — HYDROXYZINE HCL 25 MG PO TABS: 50.0000 mg | ORAL_TABLET | ORAL | Status: DC | PRN

## 2013-05-14 MED ORDER — DEXTROSE-NACL 5-0.45 % IV SOLN: INTRAVENOUS | Status: DC

## 2013-05-14 MED ORDER — SODIUM CHLORIDE 0.9 % IJ SOLN
3.0000 mL | Freq: Two times a day (BID) | INTRAMUSCULAR | Status: DC
  Administered 2013-05-14: 3 mL via INTRAVENOUS

## 2013-05-14 MED ORDER — BUTALBITAL-ACETAMINOPHEN 50-650 MG PO TABS
1.0000 | ORAL_TABLET | Freq: Four times a day (QID) | ORAL | Status: DC | PRN
  Filled 2013-05-14: qty 1

## 2013-05-14 MED ORDER — MORPHINE SULFATE 4 MG/ML IJ SOLN: 4.0000 mg | INTRAMUSCULAR | Status: DC | PRN

## 2013-05-14 MED ORDER — MIDAZOLAM HCL 5 MG/5ML IJ SOLN
INTRAMUSCULAR | Status: DC | PRN
  Administered 2013-05-14: 2 mg via INTRAVENOUS

## 2013-05-14 MED ORDER — ZOLPIDEM TARTRATE 5 MG PO TABS: 5.0000 mg | ORAL_TABLET | Freq: Every evening | ORAL | Status: DC | PRN

## 2013-05-14 MED ORDER — BUPIVACAINE HCL (PF) 0.5 % IJ SOLN
INTRAMUSCULAR | Status: DC | PRN
  Administered 2013-05-14: 15 mL

## 2013-05-14 MED ORDER — ACETAMINOPHEN 10 MG/ML IV SOLN
1000.0000 mg | Freq: Four times a day (QID) | INTRAVENOUS | Status: DC
  Administered 2013-05-14 – 2013-05-15 (×3): 1000 mg via INTRAVENOUS
  Filled 2013-05-14 (×5): qty 100

## 2013-05-14 SURGICAL SUPPLY — 64 items
BAG DECANTER FOR FLEXI CONT (MISCELLANEOUS) ×2 IMPLANT
BENZOIN TINCTURE PRP APPL 2/3 (GAUZE/BANDAGES/DRESSINGS) IMPLANT
BLADE SURG ROTATE 9660 (MISCELLANEOUS) IMPLANT
BRUSH SCRUB EZ PLAIN DRY (MISCELLANEOUS) ×2 IMPLANT
BUR ACORN 6.0 ACORN (BURR) IMPLANT
BUR ACRON 5.0MM COATED (BURR) IMPLANT
BUR MATCHSTICK NEURO 3.0 LAGG (BURR) ×2 IMPLANT
CANISTER SUCTION 2500CC (MISCELLANEOUS) ×2 IMPLANT
CLOTH BEACON ORANGE TIMEOUT ST (SAFETY) ×2 IMPLANT
CONT SPEC 4OZ CLIKSEAL STRL BL (MISCELLANEOUS) IMPLANT
DERMABOND ADHESIVE PROPEN (GAUZE/BANDAGES/DRESSINGS) ×1
DERMABOND ADVANCED (GAUZE/BANDAGES/DRESSINGS)
DERMABOND ADVANCED .7 DNX12 (GAUZE/BANDAGES/DRESSINGS) IMPLANT
DERMABOND ADVANCED .7 DNX6 (GAUZE/BANDAGES/DRESSINGS) ×1 IMPLANT
DRAPE C-ARM 42X72 X-RAY (DRAPES) ×4 IMPLANT
DRAPE LAPAROTOMY 100X72X124 (DRAPES) ×2 IMPLANT
DRAPE MICROSCOPE LEICA (MISCELLANEOUS) ×2 IMPLANT
DRAPE POUCH INSTRU U-SHP 10X18 (DRAPES) ×2 IMPLANT
DRAPE PROXIMA HALF (DRAPES) ×2 IMPLANT
DRSG EMULSION OIL 3X3 NADH (GAUZE/BANDAGES/DRESSINGS) IMPLANT
ELECT BLADE 4.0 EZ CLEAN MEGAD (MISCELLANEOUS) ×2
ELECT REM PT RETURN 9FT ADLT (ELECTROSURGICAL) ×2
ELECTRODE BLDE 4.0 EZ CLN MEGD (MISCELLANEOUS) ×1 IMPLANT
ELECTRODE REM PT RTRN 9FT ADLT (ELECTROSURGICAL) ×1 IMPLANT
GAUZE SPONGE 4X4 16PLY XRAY LF (GAUZE/BANDAGES/DRESSINGS) ×2 IMPLANT
GLOVE BIOGEL PI IND STRL 7.5 (GLOVE) ×2 IMPLANT
GLOVE BIOGEL PI IND STRL 8 (GLOVE) ×1 IMPLANT
GLOVE BIOGEL PI INDICATOR 7.5 (GLOVE) ×2
GLOVE BIOGEL PI INDICATOR 8 (GLOVE) ×1
GLOVE ECLIPSE 7.5 STRL STRAW (GLOVE) ×2 IMPLANT
GLOVE EXAM NITRILE LRG STRL (GLOVE) IMPLANT
GLOVE EXAM NITRILE MD LF STRL (GLOVE) IMPLANT
GLOVE EXAM NITRILE XL STR (GLOVE) IMPLANT
GLOVE EXAM NITRILE XS STR PU (GLOVE) IMPLANT
GLOVE SS BIOGEL STRL SZ 7 (GLOVE) ×3 IMPLANT
GLOVE SUPERSENSE BIOGEL SZ 7 (GLOVE) ×3
GOWN BRE IMP SLV AUR LG STRL (GOWN DISPOSABLE) ×4 IMPLANT
GOWN BRE IMP SLV AUR XL STRL (GOWN DISPOSABLE) IMPLANT
GOWN STRL NON-REIN LRG LVL3 (GOWN DISPOSABLE) ×2 IMPLANT
GOWN STRL REIN 2XL LVL4 (GOWN DISPOSABLE) IMPLANT
KIT BASIN OR (CUSTOM PROCEDURE TRAY) ×2 IMPLANT
KIT ROOM TURNOVER OR (KITS) ×2 IMPLANT
NEEDLE HYPO 18GX1.5 BLUNT FILL (NEEDLE) IMPLANT
NEEDLE SPNL 18GX3.5 QUINCKE PK (NEEDLE) ×6 IMPLANT
NEEDLE SPNL 22GX3.5 QUINCKE BK (NEEDLE) ×2 IMPLANT
NS IRRIG 1000ML POUR BTL (IV SOLUTION) ×2 IMPLANT
PACK LAMINECTOMY NEURO (CUSTOM PROCEDURE TRAY) ×2 IMPLANT
PAD ARMBOARD 7.5X6 YLW CONV (MISCELLANEOUS) ×6 IMPLANT
PATTIES SURGICAL .5 X1 (DISPOSABLE) ×2 IMPLANT
RUBBERBAND STERILE (MISCELLANEOUS) ×4 IMPLANT
SPONGE GAUZE 4X4 12PLY (GAUZE/BANDAGES/DRESSINGS) IMPLANT
SPONGE LAP 4X18 X RAY DECT (DISPOSABLE) IMPLANT
SPONGE SURGIFOAM ABS GEL SZ50 (HEMOSTASIS) ×2 IMPLANT
STRIP CLOSURE SKIN 1/2X4 (GAUZE/BANDAGES/DRESSINGS) IMPLANT
SUT PROLENE 6 0 BV (SUTURE) IMPLANT
SUT VIC AB 1 CT1 18XBRD ANBCTR (SUTURE) ×1 IMPLANT
SUT VIC AB 1 CT1 8-18 (SUTURE) ×1
SUT VIC AB 2-0 CP2 18 (SUTURE) ×4 IMPLANT
SUT VIC AB 3-0 SH 8-18 (SUTURE) IMPLANT
SYR 20ML ECCENTRIC (SYRINGE) ×2 IMPLANT
SYR 5ML LL (SYRINGE) IMPLANT
TOWEL OR 17X24 6PK STRL BLUE (TOWEL DISPOSABLE) ×2 IMPLANT
TOWEL OR 17X26 10 PK STRL BLUE (TOWEL DISPOSABLE) ×2 IMPLANT
WATER STERILE IRR 1000ML POUR (IV SOLUTION) ×2 IMPLANT

## 2013-05-14 NOTE — Op Note (Signed)
05/14/2013  9:55 AM  PATIENT:  Monique Patton  48 y.o. female  PRE-OPERATIVE DIAGNOSIS:  Left L4-5 extraforaminal lumbar herniated disc  POST-OPERATIVE DIAGNOSIS: Left L4-5 extraforaminal lumbar herniated disc  PROCEDURE:  Procedure(s): LUMBAR LAMINECTOMY/DECOMPRESSION MICRODISCECTOMY 1 LEVEL:  Left L4-5 extraforaminal microdiscectomy with microdissection, microsurgical technique, and the operating microscope  SURGEON:  Surgeon(s): Hewitt Shorts, MD  ANESTHESIA:   general  EBL:  Total I/O In: 1350 [I.V.:1350] Out: -   BLOOD ADMINISTERED:none  COUNT: Correct per nursing staff  DICTATION: Patient was brought to the operating room and placed under general endotracheal anesthesia. Patient was turned to prone position the lumbar region was prepped with Betadine soap and solution and draped in a sterile fashion. The midline was infiltrated with local anesthetic with epinephrine. A localizing x-ray and C-arm were taken and the L4-5 level was identified. Midline incision was made over the L4-5 level and was carried down through the subcutaneous tissue to the lumbar fascia. The lumbar fascia was incised on the left side and the paraspinal muscles were dissected from the spinous processes and lamina in a subperiosteal fashion. C-arm continued to be used and the L4-5 level was identified. The operating microscope was draped and brought into the field provided additional magnification, illumination, and visualization. Dissection was carried out laterally over the facet complexes and the intertransverse space was identified. Lateral facetectomy was performed using the high-speed drill and Kerrison punches. Intertransverse fascia was opened, and we dissected into the left L4-5 extraforaminal space. We identified the exiting left L4 nerve root, and the disc herniation located ventral, medial, and caudal to the nerve root. We then incised the annular fibers overlying the fragment, and dissected a large  fragment, that was removed. Several additional smaller fragments were removed, it is felt that good decompression of the exiting nerve root was achieved. All loose fragments of disc material removed from the foraminal and extraforaminal space. We establish hemostasis use of bipolar cautery, as well as Gelfoam with thrombin. And once the discectomy was completed and hemostasis established, we instilled 2 cc of fentanyl and 80 mg of Depo-Medrol into the extraforaminal space. We then proceeded with closure. Deep fascia was closed with interrupted undyed 1 Vicryl sutures. Scarpa's fascia was closed with interrupted undyed 1 Vicryl sutures in the subcutaneous and subcuticular layer were closed with interrupted inverted 2-0 undyed Vicryl sutures. The skin edges were approximated with Dermabond. Following surgery the patient was turned back to a supine position to be reversed from the anesthetic extubated and transferred to the recovery room for further care.   PLAN OF CARE: Admit for overnight observation  PATIENT DISPOSITION:  PACU - hemodynamically stable.   Delay start of Pharmacological VTE agent (>24hrs) due to surgical blood loss or risk of bleeding:  yes

## 2013-05-14 NOTE — Progress Notes (Signed)
UR COMPLETED  

## 2013-05-14 NOTE — Preoperative (Signed)
Beta Blockers   Reason not to administer Beta Blockers:Not Applicable 

## 2013-05-14 NOTE — H&P (Signed)
Subjective: Patient is a 48 y.o. female who is admitted for treatment of left lumbar radiculopathy secondary to a left L4-5 extra foraminal lumbar disc herniation. She describes pain across the low back, down to the left buttock and posterior thigh, with numbness in the left lower extremity. The patient has not been a candidate for treatment with  NSAIDS secondary to previous gastric bypass surgery. Patient's been treated with a series of epidural steroid injections, they gave temporary but not lasting relief, and she elected to proceed with surgery for left L4-5 extra foraminal microdiscectomy.   Patient Active Problem List   Diagnosis Date Noted  . 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 Neck pain 02/12/2012  . Back pain 02/12/2012  . H/O gastric bypass    Past Medical History  Diagnosis Date  . Arthritis   . Persistent headaches   . Allergy   . H/O gastric bypass   . Heart murmur     Past Surgical History  Procedure Laterality Date  . Gastric bypass    . Abdominal hysterectomy    . Adenidectomy    . Tonsillectomy    . Anterior cervical decomp/discectomy fusion  03/13/2012    Procedure: ANTERIOR CERVICAL DECOMPRESSION/DISCECTOMY FUSION 2 LEVELS;  Surgeon: Hewitt Shorts, MD;  Location: [facility];  Service: Neurosurgery;  Laterality: N/A;  Cervical Five-Six Cervical Six-Seven Anterior Cervical Decompression with fusion plating and bonegraft    Prescriptions prior to admission  Medication Sig Dispense Refill  . ACETAMINOPHEN-BUTALBITAL 50-650 MG TABS Take 1 tablet by mouth every 6 (six) hours as needed (headache).      . gabapentin (NEURONTIN) 300 MG capsule Take 300 mg by mouth 2 (two) times daily.      . nabumetone (RELAFEN) 500 MG tablet Take 500 mg by mouth 2 (two) times daily.       Allergies  Allergen Reactions  . Tramadol Other (See Comments)    Burning sensation  . Fish Allergy Rash  . Penicillins Itching and Rash  . Shellfish Allergy Rash    History  Substance  Use Topics  . Smoking status: Current Every Day Smoker -- 0.25 packs/day for 35 years    Types: Cigarettes  . Smokeless tobacco: Not on file  . Alcohol Use: No    Family History  Problem Relation Age of Onset  . Hyperlipidemia Father   . Hypertension Father   . Diabetes Father      Review of Systems A comprehensive review of systems was negative.  Objective: Vital signs in last 24 hours: Temp:  [98.5 F (36.9 C)] 98.5 F (36.9 C) (05/15 0603) Pulse Rate:  [87] 87 (05/15 0603) Resp:  [20] 20 (05/15 0603) BP: (117)/(82) 117/82 mmHg (05/15 0603) SpO2:  [98 %] 98 % (05/15 0603)  EXAM: Patient is obese black female in no acute distress. Lungs are clear to auscultation , the patient has symmetrical respiratory excursion. Heart has a regular rate and rhythm normal S1 and S2 no murmur.   Abdomen is soft nontender nondistended bowel sounds are present. Extremity examination shows no clubbing cyanosis or edema. Musculoskeletal examination shows limited mobility in flexion and extension. Motor examination shows 5 over 5 strength in the lower extremities including the iliopsoas quadriceps dorsiflexor extensor hallicus  longus and plantar flexor bilaterally. Sensation is diminished pinprick in the lateral aspect the left foot. Reflexes are symmetrical bilaterally. No pathologic reflexes are present. Her gait and stance are somewhat hesitant.   Data Review:CBC    Component Value Date/Time  WBC 9.1 05/06/2013 1602   RBC 4.24 05/06/2013 1602   HGB 10.5* 05/06/2013 1602   HCT 30.8* 05/06/2013 1602   PLT 318 05/06/2013 1602   MCV 72.6* 05/06/2013 1602   MCH 24.8* 05/06/2013 1602   MCHC 34.1 05/06/2013 1602   RDW 17.4* 05/06/2013 1602                          BMET    Component Value Date/Time   NA 140 05/06/2013 1602   K 4.5 05/06/2013 1602   CL 105 05/06/2013 1602   CO2 28 05/06/2013 1602   GLUCOSE 78 05/06/2013 1602   BUN 13 05/06/2013 1602   CREATININE 0.84 05/06/2013 1602   CALCIUM 9.4 05/06/2013 1602    GFRNONAA 80* 05/06/2013 1602   GFRAA >90 05/06/2013 1602     Assessment/Plan: Patient with persistent left lumbar radiculopathy secondary to a left L4-5 extra foraminal disc herniation, who is admitted now for left L4-5 extra foraminal micro-discectomy.  I've discussed with the patient the nature of his condition, the nature the surgical procedure, the typical length of surgery, hospital stay, and overall recuperation. We discussed limitations postoperatively. I discussed risks of surgery including risks of infection, bleeding, possibly need for transfusion, the risk of nerve root dysfunction with pain, weakness, numbness, or paresthesias, or risk of dural tear and CSF leakage and possible need for further surgery, the risk of recurrent disc herniation and the possible need for further surgery, and the risk of anesthetic complications including myocardial infarction, stroke, pneumonia, and death. Understanding all this the patient does wish to proceed with surgery and is admitted for such.    Hewitt Shorts, MD 05/14/2013 7:25 AM

## 2013-05-14 NOTE — Transfer of Care (Signed)
Immediate Anesthesia Transfer of Care Note  Patient: Monique Patton  Procedure(s) Performed: Procedure(s) with comments: LUMBAR LAMINECTOMY/DECOMPRESSION MICRODISCECTOMY 1 LEVEL (Left) - LEFT Lumbar four-five4 extraforaminal microdiskectomy  Patient Location: PACU  Anesthesia Type:General  Level of Consciousness: awake, alert  and oriented  Airway & Oxygen Therapy: Patient Spontanous Breathing and Patient connected to nasal cannula oxygen  Post-op Assessment: Report given to PACU RN and Post -op Vital signs reviewed and stable  Post vital signs: Reviewed and stable  Complications: No apparent anesthesia complications

## 2013-05-14 NOTE — Anesthesia Preprocedure Evaluation (Addendum)
Anesthesia Evaluation  Patient identified by MRN, date of birth, ID band Patient awake    Reviewed: Allergy & Precautions, H&P , NPO status , Patient's Chart, lab work & pertinent test results, reviewed documented beta blocker date and time   History of Anesthesia Complications Negative for: history of anesthetic complications  Airway Mallampati: II TM Distance: >3 FB Neck ROM: Limited    Dental  (+) Edentulous Upper, Edentulous Lower and Dental Advisory Given   Pulmonary Current Smoker,  breath sounds clear to auscultation        Cardiovascular + Valvular Problems/Murmurs Rhythm:Regular Rate:Normal     Neuro/Psych  Headaches, negative psych ROS   GI/Hepatic Neg liver ROS, History of gastric bypass surgery. CE   Endo/Other  Morbid obesity  Renal/GU negative Renal ROS     Musculoskeletal negative musculoskeletal ROS (+) Arthritis -,   Abdominal   Peds  Hematology negative hematology ROS (+)   Anesthesia Other Findings   Reproductive/Obstetrics negative OB ROS                        Anesthesia Physical Anesthesia Plan  ASA: III  Anesthesia Plan: General   Post-op Pain Management:    Induction: Intravenous  Airway Management Planned: Oral ETT  Additional Equipment:   Intra-op Plan:   Post-operative Plan: Extubation in OR  Informed Consent: I have reviewed the patients History and Physical, chart, labs and discussed the procedure including the risks, benefits and alternatives for the proposed anesthesia with the patient or authorized representative who has indicated his/her understanding and acceptance.   Dental advisory given  Plan Discussed with: CRNA, Anesthesiologist and Surgeon  Anesthesia Plan Comments:         Anesthesia Quick Evaluation

## 2013-05-14 NOTE — Progress Notes (Signed)
Filed Vitals:   05/14/13 0603 05/14/13 1007 05/14/13 1030  BP: 117/82 145/62 135/49  Pulse: 87 95 96  Temp: 98.5 F (36.9 C) 97.7 F (36.5 C)   TempSrc: Oral    Resp: 20 23 19   SpO2: 98% 100%     Patient resting comfortably in the PACU. Moving all extremities well. Wound clean and dry. No voided yet.  Plan: To transfer to 35 one unit from PACU. We'll progress to postoperative recovery. Dr. Marikay Alar we'll assess patient in morning and is doing well discharged to home.  Hewitt Shorts, MD 05/14/2013, 10:45 AM

## 2013-05-14 NOTE — Anesthesia Postprocedure Evaluation (Signed)
  Anesthesia Post-op Note  Patient: Monique Patton  Procedure(s) Performed: Procedure(s) with comments: LUMBAR LAMINECTOMY/DECOMPRESSION MICRODISCECTOMY 1 LEVEL (Left) - LEFT Lumbar four-five4 extraforaminal microdiskectomy  Patient Location: PACU  Anesthesia Type:General  Level of Consciousness: awake  Airway and Oxygen Therapy: Patient Spontanous Breathing  Post-op Pain: mild  Post-op Assessment: Post-op Vital signs reviewed  Post-op Vital Signs: Reviewed  Complications: No apparent anesthesia complications

## 2013-05-14 NOTE — OR Nursing (Signed)
Fentanyl 100 mcg/48ml used at surgical site by Dr Newell Coral

## 2013-05-15 ENCOUNTER — Encounter (HOSPITAL_COMMUNITY): Payer: Self-pay | Admitting: Neurosurgery

## 2013-05-15 MED ORDER — OXYCODONE HCL 5 MG PO TABS: 5.0000 mg | ORAL_TABLET | ORAL | Status: DC | PRN

## 2013-05-15 NOTE — Discharge Summary (Signed)
Physician Discharge Summary  Patient ID: Monique Patton MRN: [id number] DOB/AGE: December 03, 1965 48 y.o.  Admit date: 05/14/2013 Discharge date: 05/15/2013  Admission Diagnoses: lumbar HNP   Discharge Diagnoses: same   Discharged Condition: good  Hospital Course: The patient was admitted on 05/14/2013 and taken to the operating room where the patient underwent microdiskectomy. The patient tolerated the procedure well and was taken to the recovery room and then to the floor in stable condition. The hospital course was routine. There were no complications. The wound remained clean dry and intact. Pt had appropriate back soreness. No complaints of leg pain or new N/T/W. The patient remained afebrile with stable vital signs, and tolerated a regular diet. The patient continued to increase activities, and pain was well controlled with oral pain medications.   Consults: None  Significant Diagnostic Studies:  Results for orders placed during the hospital encounter of 05/06/13  SURGICAL PCR SCREEN      Result Value Range   MRSA, PCR NEGATIVE  NEGATIVE   Staphylococcus aureus NEGATIVE  NEGATIVE  CBC      Result Value Range   WBC 9.1  4.0 - 10.5 K/uL   RBC 4.24  3.87 - 5.11 MIL/uL   Hemoglobin 10.5 (*) 12.0 - 15.0 g/dL   HCT 78.2 (*) 95.6 - 21.3 %   MCV 72.6 (*) 78.0 - 100.0 fL   MCH 24.8 (*) 26.0 - 34.0 pg   MCHC 34.1  30.0 - 36.0 g/dL   RDW 08.6 (*) 57.8 - 46.9 %   Platelets 318  150 - 400 K/uL  BASIC METABOLIC PANEL      Result Value Range   Sodium 140  135 - 145 mEq/L   Potassium 4.5  3.5 - 5.1 mEq/L   Chloride 105  96 - 112 mEq/L   CO2 28  19 - 32 mEq/L   Glucose, Bld 78  70 - 99 mg/dL   BUN 13  6 - 23 mg/dL   Creatinine, Ser 6.29  0.50 - 1.10 mg/dL   Calcium 9.4  8.4 - 52.8 mg/dL   GFR calc non Af Amer 80 (*) >90 mL/min   GFR calc Af Amer >90  >90 mL/min    Dg C-arm 1-60 Min-no Report  05/14/2013   CLINICAL DATA: lum. lam. no report   C-ARM 1-60 MINUTES  Fluoroscopy was  utilized by the requesting physician.  No radiographic  interpretation.     Antibiotics:  Anti-infectives   Start     Dose/Rate Route Frequency Ordered Stop   05/14/13 0844  bacitracin 50,000 Units in sodium chloride irrigation 0.9 % 500 mL irrigation  Status:  Discontinued       As needed 05/14/13 0844 05/14/13 1009   05/14/13 0745  gentamicin (GARAMYCIN) IVPB 80 mg     80 mg 100 mL/hr over 30 Minutes Intravenous To Neuro OR-Station #32 05/14/13 0735 05/14/13 0825   05/14/13 0709  bacitracin 41324 UNITS injection    Comments:  STEELMAN, CRAIG: cabinet override      05/14/13 0709 05/14/13 1914   05/14/13 0600  vancomycin (VANCOCIN) IVPB 1000 mg/200 mL premix     1,000 mg 200 mL/hr over 60 Minutes Intravenous On call to O.R. 05/14/13 0549 05/14/13 0720      Discharge Exam: Blood pressure 115/77, pulse 71, temperature 98.6 F (37 C), temperature source Oral, resp. rate 18, SpO2 95.00%. Neurologic: Grossly normal Incision ok  Discharge Medications:     Medication List    TAKE these  medications       ACETAMINOPHEN-BUTALBITAL 50-650 MG Tabs  Take 1 tablet by mouth every 6 (six) hours as needed (headache).     gabapentin 300 MG capsule  Commonly known as:  NEURONTIN  Take 300 mg by mouth 2 (two) times daily.     nabumetone 500 MG tablet  Commonly known as:  RELAFEN  Take 500 mg by mouth 2 (two) times daily.     oxyCODONE 5 MG immediate release tablet  Commonly known as:  Oxy IR/ROXICODONE  Take 1-2 tablets (5-10 mg total) by mouth every 4 (four) hours as needed.        Disposition: home   Final Dx: microdiskectomy      Discharge Orders   Future Orders Complete By Expires     Call MD for:  difficulty breathing, headache or visual disturbances  As directed     Call MD for:  persistant nausea and vomiting  As directed     Call MD for:  redness, tenderness, or signs of infection (pain, swelling, redness, odor or green/yellow discharge around incision site)  As  directed     Call MD for:  severe uncontrolled pain  As directed     Call MD for:  temperature >100.4  As directed     Diet - low sodium heart healthy  As directed     Discharge instructions  As directed     Comments:      No lifting, bending, or twisting. No driving.    Increase activity slowly  As directed     Remove dressing in 48 hours  As directed        Follow-up Information   Follow up with Hewitt Shorts, MD. Schedule an appointment as soon as possible for a visit in 2 weeks.   Contact information:   [address] [place] [address] [phone]        Signed: Tia Alert 05/15/2013, 7:05 AM

## 2013-05-15 NOTE — Progress Notes (Signed)
Pt given D/C instructions with Rx, verbal understanding given. Pt D/C'd home via wheelchair @ 1000 per MD order. Rema Fendt, RN

## 2013-07-17 DIAGNOSIS — M5126 Other intervertebral disc displacement, lumbar region: Secondary | ICD-10-CM | POA: Insufficient documentation

## 2013-10-07 ENCOUNTER — Ambulatory Visit: Payer: Self-pay | Admitting: Neurosurgery

## 2013-10-21 ENCOUNTER — Other Ambulatory Visit: Payer: Self-pay | Admitting: Neurosurgery

## 2013-10-29 NOTE — Pre-Procedure Instructions (Signed)
RAYANNE PADMANABHAN  10/29/2013   Your procedure is scheduled on:  Thursday, November 6th.  Report to [facility], Main Entrance/Entrance "A" at  11:15-AM.  Call this number if you have problems the morning of surgery: [phone]   Remember:   Do not eat food or drink liquids after midnight.   Take these medicines the morning of surgery with A SIP OF WATER:gabapentin (NEURONTIN).  Take if needed:oxyCODONE (OXY IR/ROXICODONE)  Stop taking Aspirin, Coumadin, Plavix, Effient and Herbal medications.  Do not take any NSAIDs ie: Ibuprofen,  Advil,Naproxen,nabumetone (RELAFEN) or any medication containing Aspirin.     Do not wear jewelry, make-up or nail polish.  Do not wear lotions, powders, or perfumes. You may wear deodorant.  Do not shave 48 hours prior to surgery. Men may shave face and neck.  Do not bring valuables to the hospital.  [facility] is not responsible  for any belongings or valuables.               Contacts, dentures or bridgework may not be worn into surgery.  Leave suitcase in the car. After surgery it may be brought to your room.  For patients admitted to the hospital, discharge time is determined by your treatment team.               Special Instructions: Shower using CHG 2 nights before surgery and the night before surgery.  If you shower the day of surgery use CHG.  Use special wash - you have one bottle of CHG for all showers.  You should use approximately 1/3 of the bottle for each shower.   Please read over the following fact sheets that you were given: Pain Booklet, Coughing and Deep Breathing and Surgical Site Infection Prevention

## 2013-10-30 ENCOUNTER — Encounter (HOSPITAL_COMMUNITY)
Admission: RE | Admit: 2013-10-30 | Discharge: 2013-10-30 | Disposition: A | Discharge status: Home / Self Care | Payer: BC Managed Care – PPO | Source: Ambulatory Visit | Attending: Neurosurgery | Admitting: Neurosurgery

## 2013-10-30 ENCOUNTER — Encounter (HOSPITAL_COMMUNITY): Payer: Self-pay

## 2013-10-30 DIAGNOSIS — Z01818 Encounter for other preprocedural examination: Secondary | ICD-10-CM | POA: Insufficient documentation

## 2013-10-30 DIAGNOSIS — Z01812 Encounter for preprocedural laboratory examination: Secondary | ICD-10-CM | POA: Insufficient documentation

## 2013-10-30 LAB — CBC
MCH: 24 pg — ABNORMAL LOW (ref 26.0–34.0)
MCHC: 32.3 g/dL (ref 30.0–36.0)
Platelets: 276 10*3/uL (ref 150–400)
RBC: 4.45 MIL/uL (ref 3.87–5.11)

## 2013-10-30 LAB — BASIC METABOLIC PANEL
BUN: 11 mg/dL (ref 6–23)
Calcium: 8.8 mg/dL (ref 8.4–10.5)
GFR calc non Af Amer: 81 mL/min — ABNORMAL LOW (ref >90)
Glucose, Bld: 89 mg/dL (ref 70–99)
Sodium: 139 mEq/L (ref 135–145)

## 2013-10-30 LAB — SURGICAL PCR SCREEN
MRSA, PCR: NEGATIVE
Staphylococcus aureus: NEGATIVE

## 2013-11-04 MED ORDER — VANCOMYCIN HCL 10 G IV SOLR
1500.0000 mg | INTRAVENOUS | Status: AC
  Administered 2013-11-05: 1500 mg via INTRAVENOUS
  Filled 2013-11-04: qty 1500

## 2013-11-05 ENCOUNTER — Ambulatory Visit (HOSPITAL_COMMUNITY): Payer: BC Managed Care – PPO

## 2013-11-05 ENCOUNTER — Encounter (HOSPITAL_COMMUNITY): Payer: BC Managed Care – PPO | Admitting: Anesthesiology

## 2013-11-05 ENCOUNTER — Encounter (HOSPITAL_COMMUNITY): Payer: Self-pay | Admitting: *Deleted

## 2013-11-05 ENCOUNTER — Observation Stay (HOSPITAL_COMMUNITY)
Admission: RE | Admit: 2013-11-05 | Discharge: 2013-11-07 | Disposition: A | Discharge status: Home / Self Care | Payer: BC Managed Care – PPO | Source: Ambulatory Visit | Attending: Neurosurgery | Admitting: Neurosurgery

## 2013-11-05 ENCOUNTER — Ambulatory Visit (HOSPITAL_COMMUNITY): Payer: BC Managed Care – PPO | Admitting: Anesthesiology

## 2013-11-05 ENCOUNTER — Encounter (HOSPITAL_COMMUNITY)
Admission: RE | Disposition: A | Discharge status: Home / Self Care | Payer: Self-pay | Source: Ambulatory Visit | Attending: Neurosurgery

## 2013-11-05 DIAGNOSIS — Z23 Encounter for immunization: Secondary | ICD-10-CM | POA: Insufficient documentation

## 2013-11-05 DIAGNOSIS — Z9884 Bariatric surgery status: Secondary | ICD-10-CM | POA: Insufficient documentation

## 2013-11-05 DIAGNOSIS — T84498A Other mechanical complication of other internal orthopedic devices, implants and grafts, initial encounter: Principal | ICD-10-CM | POA: Insufficient documentation

## 2013-11-05 DIAGNOSIS — Z981 Arthrodesis status: Secondary | ICD-10-CM | POA: Insufficient documentation

## 2013-11-05 DIAGNOSIS — Y832 Surgical operation with anastomosis, bypass or graft as the cause of abnormal reaction of the patient, or of later complication, without mention of misadventure at the time of the procedure: Secondary | ICD-10-CM | POA: Insufficient documentation

## 2013-11-05 HISTORY — PX: POSTERIOR CERVICAL FUSION/FORAMINOTOMY: SHX5038

## 2013-11-05 SURGERY — POSTERIOR CERVICAL FUSION/FORAMINOTOMY LEVEL 1
Anesthesia: General | Site: Neck | Wound class: Clean

## 2013-11-05 MED ORDER — THROMBIN 20000 UNITS EX SOLR
CUTANEOUS | Status: DC | PRN
  Administered 2013-11-05: 16:00:00 via TOPICAL

## 2013-11-05 MED ORDER — 0.9 % SODIUM CHLORIDE (POUR BTL) OPTIME
TOPICAL | Status: DC | PRN
  Administered 2013-11-05: 1000 mL

## 2013-11-05 MED ORDER — MIDAZOLAM HCL 5 MG/5ML IJ SOLN
INTRAMUSCULAR | Status: DC | PRN
  Administered 2013-11-05 (×2): 1 mg via INTRAVENOUS

## 2013-11-05 MED ORDER — OXYCODONE HCL 5 MG/5ML PO SOLN: 5.0000 mg | Freq: Once | ORAL | Status: AC | PRN

## 2013-11-05 MED ORDER — HYDROCODONE-ACETAMINOPHEN 5-325 MG PO TABS: 1.0000 | ORAL_TABLET | ORAL | Status: DC | PRN

## 2013-11-05 MED ORDER — BUTALBITAL-ACETAMINOPHEN 50-650 MG PO TABS: 1.0000 | ORAL_TABLET | Freq: Four times a day (QID) | ORAL | Status: DC | PRN

## 2013-11-05 MED ORDER — MENTHOL 3 MG MT LOZG: 1.0000 | LOZENGE | OROMUCOSAL | Status: DC | PRN

## 2013-11-05 MED ORDER — GENTAMICIN IN SALINE 1-0.9 MG/ML-% IV SOLN
100.0000 mg | INTRAVENOUS | Status: AC
  Administered 2013-11-05: 100 mg via INTRAVENOUS
  Filled 2013-11-05 (×2): qty 100

## 2013-11-05 MED ORDER — ROCURONIUM BROMIDE 100 MG/10ML IV SOLN
INTRAVENOUS | Status: DC | PRN
  Administered 2013-11-05: 30 mg via INTRAVENOUS
  Administered 2013-11-05: 20 mg via INTRAVENOUS

## 2013-11-05 MED ORDER — KETOROLAC TROMETHAMINE 30 MG/ML IJ SOLN
30.0000 mg | Freq: Once | INTRAMUSCULAR | Status: AC
  Administered 2013-11-05: 30 mg via INTRAVENOUS

## 2013-11-05 MED ORDER — LIDOCAINE-EPINEPHRINE 1 %-1:100000 IJ SOLN
INTRAMUSCULAR | Status: DC | PRN
  Administered 2013-11-05: 10 mL

## 2013-11-05 MED ORDER — ARTIFICIAL TEARS OP OINT
TOPICAL_OINTMENT | OPHTHALMIC | Status: DC | PRN
  Administered 2013-11-05: 1 via OPHTHALMIC

## 2013-11-05 MED ORDER — ACETAMINOPHEN 10 MG/ML IV SOLN
1000.0000 mg | Freq: Once | INTRAVENOUS | Status: AC
  Administered 2013-11-05: 1000 mg via INTRAVENOUS

## 2013-11-05 MED ORDER — MORPHINE SULFATE 4 MG/ML IJ SOLN
4.0000 mg | INTRAMUSCULAR | Status: DC | PRN
  Administered 2013-11-05: 4 mg via INTRAMUSCULAR
  Filled 2013-11-05: qty 1

## 2013-11-05 MED ORDER — ALUM & MAG HYDROXIDE-SIMETH 200-200-20 MG/5ML PO SUSP: 30.0000 mL | Freq: Four times a day (QID) | ORAL | Status: DC | PRN

## 2013-11-05 MED ORDER — HYDROMORPHONE HCL PF 1 MG/ML IJ SOLN
INTRAMUSCULAR | Status: AC
  Filled 2013-11-05: qty 1

## 2013-11-05 MED ORDER — ACETAMINOPHEN 325 MG PO TABS: 650.0000 mg | ORAL_TABLET | ORAL | Status: DC | PRN

## 2013-11-05 MED ORDER — GENTAMICIN IN SALINE 1-0.9 MG/ML-% IV SOLN
100.0000 mg | INTRAVENOUS | Status: DC
  Filled 2013-11-05: qty 100

## 2013-11-05 MED ORDER — BISACODYL 10 MG RE SUPP: 10.0000 mg | Freq: Every day | RECTAL | Status: DC | PRN

## 2013-11-05 MED ORDER — HYDROMORPHONE HCL PF 1 MG/ML IJ SOLN
0.2500 mg | INTRAMUSCULAR | Status: DC | PRN
  Administered 2013-11-05 (×2): 0.5 mg via INTRAVENOUS

## 2013-11-05 MED ORDER — LACTATED RINGERS IV SOLN
INTRAVENOUS | Status: DC | PRN
  Administered 2013-11-05: 14:00:00 via INTRAVENOUS

## 2013-11-05 MED ORDER — ACETAMINOPHEN 650 MG RE SUPP: 650.0000 mg | RECTAL | Status: DC | PRN

## 2013-11-05 MED ORDER — KETOROLAC TROMETHAMINE 30 MG/ML IJ SOLN
30.0000 mg | Freq: Four times a day (QID) | INTRAMUSCULAR | Status: DC
  Administered 2013-11-05 – 2013-11-07 (×6): 30 mg via INTRAVENOUS
  Filled 2013-11-05 (×7): qty 1

## 2013-11-05 MED ORDER — PROPOFOL 10 MG/ML IV BOLUS
INTRAVENOUS | Status: DC | PRN
  Administered 2013-11-05: 180 mg via INTRAVENOUS

## 2013-11-05 MED ORDER — ACETAMINOPHEN 10 MG/ML IV SOLN
INTRAVENOUS | Status: AC
  Filled 2013-11-05: qty 100

## 2013-11-05 MED ORDER — ONDANSETRON HCL 4 MG/2ML IJ SOLN
INTRAMUSCULAR | Status: DC | PRN
  Administered 2013-11-05: 4 mg via INTRAVENOUS

## 2013-11-05 MED ORDER — FENTANYL CITRATE 0.05 MG/ML IJ SOLN
INTRAMUSCULAR | Status: DC | PRN
  Administered 2013-11-05 (×6): 50 ug via INTRAVENOUS

## 2013-11-05 MED ORDER — MAGNESIUM HYDROXIDE 400 MG/5ML PO SUSP: 30.0000 mL | Freq: Every day | ORAL | Status: DC | PRN

## 2013-11-05 MED ORDER — GLYCOPYRROLATE 0.2 MG/ML IJ SOLN
INTRAMUSCULAR | Status: DC | PRN
  Administered 2013-11-05: 0.6 mg via INTRAVENOUS

## 2013-11-05 MED ORDER — OXYCODONE-ACETAMINOPHEN 5-325 MG PO TABS
1.0000 | ORAL_TABLET | ORAL | Status: DC | PRN
  Administered 2013-11-06 – 2013-11-07 (×6): 2 via ORAL
  Filled 2013-11-05 (×6): qty 2

## 2013-11-05 MED ORDER — SODIUM CHLORIDE 0.9 % IR SOLN
Status: DC | PRN
  Administered 2013-11-05: 16:00:00

## 2013-11-05 MED ORDER — KETOROLAC TROMETHAMINE 30 MG/ML IJ SOLN
INTRAMUSCULAR | Status: AC
  Filled 2013-11-05: qty 1

## 2013-11-05 MED ORDER — PHENOL 1.4 % MT LIQD: 1.0000 | OROMUCOSAL | Status: DC | PRN

## 2013-11-05 MED ORDER — OXYCODONE HCL 5 MG PO TABS
ORAL_TABLET | ORAL | Status: AC
  Filled 2013-11-05: qty 1

## 2013-11-05 MED ORDER — PROMETHAZINE HCL 25 MG/ML IJ SOLN: 6.2500 mg | INTRAMUSCULAR | Status: DC | PRN

## 2013-11-05 MED ORDER — LIDOCAINE HCL (CARDIAC) 20 MG/ML IV SOLN
INTRAVENOUS | Status: DC | PRN
  Administered 2013-11-05: 80 mg via INTRAVENOUS

## 2013-11-05 MED ORDER — SODIUM CHLORIDE 0.9 % IJ SOLN
3.0000 mL | Freq: Two times a day (BID) | INTRAMUSCULAR | Status: DC
  Administered 2013-11-06 (×2): 3 mL via INTRAVENOUS

## 2013-11-05 MED ORDER — SODIUM CHLORIDE 0.9 % IJ SOLN: 3.0000 mL | INTRAMUSCULAR | Status: DC | PRN

## 2013-11-05 MED ORDER — VECURONIUM BROMIDE 10 MG IV SOLR
INTRAVENOUS | Status: DC | PRN
  Administered 2013-11-05 (×2): 1 mg via INTRAVENOUS

## 2013-11-05 MED ORDER — LACTATED RINGERS IV SOLN
INTRAVENOUS | Status: DC
  Administered 2013-11-05: 11:00:00 via INTRAVENOUS

## 2013-11-05 MED ORDER — SUCCINYLCHOLINE CHLORIDE 20 MG/ML IJ SOLN
INTRAMUSCULAR | Status: DC | PRN
  Administered 2013-11-05: 120 mg via INTRAVENOUS

## 2013-11-05 MED ORDER — HYDROXYZINE HCL 25 MG PO TABS: 50.0000 mg | ORAL_TABLET | ORAL | Status: DC | PRN

## 2013-11-05 MED ORDER — KCL IN DEXTROSE-NACL 20-5-0.45 MEQ/L-%-% IV SOLN
INTRAVENOUS | Status: DC
  Filled 2013-11-05 (×6): qty 1000

## 2013-11-05 MED ORDER — GABAPENTIN 300 MG PO CAPS
300.0000 mg | ORAL_CAPSULE | Freq: Two times a day (BID) | ORAL | Status: DC
  Administered 2013-11-06 – 2013-11-07 (×3): 300 mg via ORAL
  Filled 2013-11-05 (×6): qty 1

## 2013-11-05 MED ORDER — SODIUM CHLORIDE 0.9 % IV SOLN
INTRAVENOUS | Status: DC | PRN
  Administered 2013-11-05: 15:00:00 via INTRAVENOUS

## 2013-11-05 MED ORDER — ONDANSETRON HCL 4 MG/2ML IJ SOLN: 4.0000 mg | Freq: Four times a day (QID) | INTRAMUSCULAR | Status: DC | PRN

## 2013-11-05 MED ORDER — CYCLOBENZAPRINE HCL 10 MG PO TABS
10.0000 mg | ORAL_TABLET | Freq: Three times a day (TID) | ORAL | Status: DC | PRN
  Administered 2013-11-06 – 2013-11-07 (×2): 10 mg via ORAL
  Filled 2013-11-05 (×2): qty 1

## 2013-11-05 MED ORDER — NEOSTIGMINE METHYLSULFATE 1 MG/ML IJ SOLN
INTRAMUSCULAR | Status: DC | PRN
  Administered 2013-11-05: 5 mg via INTRAVENOUS

## 2013-11-05 MED ORDER — OXYCODONE HCL 5 MG PO TABS
5.0000 mg | ORAL_TABLET | Freq: Once | ORAL | Status: AC | PRN
  Administered 2013-11-05: 5 mg via ORAL

## 2013-11-05 MED ORDER — BUPIVACAINE HCL (PF) 0.5 % IJ SOLN
INTRAMUSCULAR | Status: DC | PRN
  Administered 2013-11-05: 10 mL

## 2013-11-05 SURGICAL SUPPLY — 69 items
2.5MM DRILL ×2 IMPLANT
3.5MM TAP ×2 IMPLANT
BAG DECANTER FOR FLEXI CONT (MISCELLANEOUS) ×2 IMPLANT
BIT DRILL NEURO 2X3.1 SFT TUCH (MISCELLANEOUS) ×1 IMPLANT
BIT DRILL WIRE PASS 1.3MM (BIT) IMPLANT
BLADE SURG 11 STRL SS (BLADE) ×2 IMPLANT
BLADE SURG ROTATE 9660 (MISCELLANEOUS) ×2 IMPLANT
BLOCKER OASYS (Neuro Prosthesis/Implant) ×8 IMPLANT
BRUSH SCRUB EZ PLAIN DRY (MISCELLANEOUS) ×2 IMPLANT
CANISTER SUCT 3000ML (MISCELLANEOUS) ×2 IMPLANT
CONT SPEC 4OZ CLIKSEAL STRL BL (MISCELLANEOUS) ×2 IMPLANT
COVER TABLE BACK 60X90 (DRAPES) ×2 IMPLANT
DECANTER SPIKE VIAL GLASS SM (MISCELLANEOUS) ×2 IMPLANT
DERMABOND ADVANCED (GAUZE/BANDAGES/DRESSINGS) ×1
DERMABOND ADVANCED .7 DNX12 (GAUZE/BANDAGES/DRESSINGS) ×1 IMPLANT
DRAPE C-ARM 42X72 X-RAY (DRAPES) ×4 IMPLANT
DRAPE LAPAROTOMY 100X72 PEDS (DRAPES) ×2 IMPLANT
DRAPE POUCH INSTRU U-SHP 10X18 (DRAPES) ×2 IMPLANT
DRAPE PROXIMA HALF (DRAPES) IMPLANT
DRILL NEURO 2X3.1 SOFT TOUCH (MISCELLANEOUS) ×2
DRILL OASYS 2.5MM (BIT) ×1 IMPLANT
DRILL WIRE PASS 1.3MM (BIT)
DRIUS OASYS 2.5MM (BIT) ×2
DRSG EMULSION OIL 3X3 NADH (GAUZE/BANDAGES/DRESSINGS) IMPLANT
ELECT REM PT RETURN 9FT ADLT (ELECTROSURGICAL) ×2
ELECTRODE REM PT RTRN 9FT ADLT (ELECTROSURGICAL) ×1 IMPLANT
EVACUATOR 1/8 PVC DRAIN (DRAIN) IMPLANT
GAUZE SPONGE 4X4 16PLY XRAY LF (GAUZE/BANDAGES/DRESSINGS) IMPLANT
GLOVE BIO SURGEON STRL SZ 6.5 (GLOVE) ×4 IMPLANT
GLOVE BIOGEL PI IND STRL 6.5 (GLOVE) ×1 IMPLANT
GLOVE BIOGEL PI IND STRL 8 (GLOVE) ×1 IMPLANT
GLOVE BIOGEL PI INDICATOR 6.5 (GLOVE) ×1
GLOVE BIOGEL PI INDICATOR 8 (GLOVE) ×1
GLOVE ECLIPSE 7.5 STRL STRAW (GLOVE) ×6 IMPLANT
GLOVE EXAM NITRILE LRG STRL (GLOVE) IMPLANT
GLOVE EXAM NITRILE MD LF STRL (GLOVE) IMPLANT
GLOVE EXAM NITRILE XL STR (GLOVE) IMPLANT
GLOVE EXAM NITRILE XS STR PU (GLOVE) IMPLANT
GOWN BRE IMP SLV AUR LG STRL (GOWN DISPOSABLE) ×4 IMPLANT
GOWN BRE IMP SLV AUR XL STRL (GOWN DISPOSABLE) ×2 IMPLANT
GOWN STRL REIN 2XL LVL4 (GOWN DISPOSABLE) IMPLANT
HEMOSTAT SURGICEL 2X14 (HEMOSTASIS) IMPLANT
KIT BASIN OR (CUSTOM PROCEDURE TRAY) ×2 IMPLANT
KIT INFUSE XX SMALL 0.7CC (Orthopedic Implant) ×2 IMPLANT
KIT ROOM TURNOVER OR (KITS) ×2 IMPLANT
NEEDLE SPNL 18GX3.5 QUINCKE PK (NEEDLE) ×2 IMPLANT
NEEDLE SPNL 22GX3.5 QUINCKE BK (NEEDLE) ×2 IMPLANT
NS IRRIG 1000ML POUR BTL (IV SOLUTION) ×2 IMPLANT
PACK LAMINECTOMY NEURO (CUSTOM PROCEDURE TRAY) ×2 IMPLANT
PAD ARMBOARD 7.5X6 YLW CONV (MISCELLANEOUS) ×6 IMPLANT
PIN MAYFIELD SKULL DISP (PIN) ×2 IMPLANT
ROD OASYS 3.5X25MM (Rod) ×4 IMPLANT
SCREW BIASED ANGLE 3.5X14 (Screw) ×8 IMPLANT
SPONGE GAUZE 4X4 12PLY (GAUZE/BANDAGES/DRESSINGS) ×2 IMPLANT
SPONGE LAP 4X18 X RAY DECT (DISPOSABLE) IMPLANT
SPONGE SURGIFOAM ABS GEL 100 (HEMOSTASIS) ×2 IMPLANT
STAPLER SKIN PROX WIDE 3.9 (STAPLE) ×2 IMPLANT
STRIP BIOACTIVE VITOSS 25X52X4 (Orthopedic Implant) ×2 IMPLANT
SUT ETHILON 3 0 FSL (SUTURE) IMPLANT
SUT VIC AB 0 CT1 18XCR BRD8 (SUTURE) ×2 IMPLANT
SUT VIC AB 0 CT1 8-18 (SUTURE) ×2
SUT VIC AB 2-0 CP2 18 (SUTURE) ×4 IMPLANT
SYR 20ML ECCENTRIC (SYRINGE) ×2 IMPLANT
TAPE CLOTH SURG 4X10 WHT LF (GAUZE/BANDAGES/DRESSINGS) ×2 IMPLANT
TOWEL OR 17X24 6PK STRL BLUE (TOWEL DISPOSABLE) ×2 IMPLANT
TOWEL OR 17X26 10 PK STRL BLUE (TOWEL DISPOSABLE) ×2 IMPLANT
TRAY FOLEY CATH 14FRSI W/METER (CATHETERS) IMPLANT
UNDERPAD 30X30 INCONTINENT (UNDERPADS AND DIAPERS) ×2 IMPLANT
WATER STERILE IRR 1000ML POUR (IV SOLUTION) ×2 IMPLANT

## 2013-11-05 NOTE — Preoperative (Signed)
Beta Blockers   Reason not to administer Beta Blockers:Not Applicable 

## 2013-11-05 NOTE — Transfer of Care (Signed)
Immediate Anesthesia Transfer of Care Note  Patient: Monique Patton  Procedure(s) Performed: Procedure(s) with comments: Cervical five-six posterior arthrodesis and instrumentation (N/A) - Cervical five-six posterior arthrodesis and instrumentation  Patient Location: PACU  Anesthesia Type:General  Level of Consciousness: awake, oriented and patient cooperative  Airway & Oxygen Therapy: Patient Spontanous Breathing and Patient connected to nasal cannula oxygen  Post-op Assessment: Report given to PACU RN and Post -op Vital signs reviewed and stable  Post vital signs: Reviewed  Complications: No apparent anesthesia complications

## 2013-11-05 NOTE — Op Note (Signed)
11/05/2013  5:00 PM  PATIENT:  Monique Patton  48 y.o. 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  ANESTHESIA:   general  EBL:  Total I/O In: 1000 [I.V.:1000] Out: -   BLOOD ADMINISTERED:none  COUNT: Correct per nursing staff  DICTATION: Patient was brought to the operating room, placed under general endotracheal anesthesia.  The 3 pin Mayfield head holder was applied, and the patient was turned to prone position. The base of the occipital scalp was shaved with clippers, and then the occiput, posterior neck, and upper back were prepped with Betadine soap and solution and draped in a sterile fashion. The C5-6 level was identified with C-arm fluoroscopy, and the midline was infiltrated with local site with epinephrine. Midline incision was made over the C5-6 level and carried down to subcutaneous tissue. Posterior cervical fascia was incised bilaterally and the paracervical musculature was dissected from the spinous process and lamina in a subperiosteal fashion. Bipolar cautery and electrocautery were used to maintain hemostasis. C-arm fluoroscopy was again used in both AP and lateral projections to identify the C5-6 level, and the lateral masses of C5 and C6 were identified. Pilot holes were made with the high-speed drill, and then screw holes were drilled into the lateral masses and a superior, lateral, and ventral trajectory. Each screw hole was examined with the ball probe, and good bony surfaces found. The posterior cortex at each screw hole was tapped, and then we placed 3.5 x 14 mm screws bilaterally at  each level. C-arm fluoroscopy confirmed good positioning of the lateral mass screws bilaterally at the C5 and C6 levels. We then decorticated the facet joints of C5-6 bilaterally, and decorticated the lamina of C5 and C6 bilaterally. Infuse and Vitoss BA were packed into the C5-6 facet joint bilaterally, as well as laid over the lamina of C5 and 6 bilaterally. We then proceeded with closure. Deep fascia was closed with interrupted undyed 0 Vicryl sutures, Scarpa's fascia closed with interrupted inverted undyed 0 Vicryl sutures. The subcutaneous and subcuticular closed with interrupted inverted 2-0 undyed Vicryl suture, and the skin edges were approximate Dermabond. The wound was dressed with sterile gauze and Hypafix. Following surgery the patient was turned back to a supine position, the 3 pin Mayfield head holder was removed, and the patient is to be reversed an anesthetic, extubated, and transferred to the recovery further care.  PLAN OF CARE: Admit for overnight observation  PATIENT DISPOSITION:  PACU - hemodynamically stable.   Delay start of Pharmacological VTE agent (>24hrs) due to surgical blood loss or risk of bleeding:  yes

## 2013-11-05 NOTE — Progress Notes (Signed)
Filed Vitals:   11/05/13 1053 11/05/13 1702  BP: 140/64 135/66  Pulse: 86 99  Temp: 98.3 F (36.8 C) 98.3 F (36.8 C)  TempSrc: Oral   Resp: 20 33  SpO2: 100% 100%    Patient resting comfortably in recovery room. Moving all 4 extremities. Dressing clean and dry. No void yet.  Plan: Progress through postoperative recovery.  Hewitt Shorts, MD 11/05/2013, 5:29 PM

## 2013-11-05 NOTE — Anesthesia Postprocedure Evaluation (Signed)
  Anesthesia Post-op Note  Patient: Monique Patton  Procedure(s) Performed: Procedure(s) with comments: Cervical five-six posterior arthrodesis and instrumentation (N/A) - Cervical five-six posterior arthrodesis and instrumentation  Patient Location: PACU  Anesthesia Type:General  Level of Consciousness: awake, alert , oriented and patient cooperative  Airway and Oxygen Therapy: Patient Spontanous Breathing and Patient connected to nasal cannula oxygen  Post-op Pain: 3 /10  Post-op Assessment: Post-op Vital signs reviewed, Patient's Cardiovascular Status Stable, Respiratory Function Stable, Patent Airway, No signs of Nausea or vomiting and Pain level controlled  Post-op Vital Signs: Reviewed and stable  Complications: No apparent anesthesia complications

## 2013-11-05 NOTE — H&P (Signed)
Subjective: Patient is a 48 y.o. 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  Patient is status post a C5-6 and C6-7 ACDF in March 2013. 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 She is admitted now for a C5-6 posterior cervical arthrodesis with posterior instrumentation and bone graft.   Patient Active Problem List   Diagnosis Date Noted  . 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 Neck pain 02/12/2012  . Back pain 02/12/2012  . H/O gastric bypass    Past Medical History  Diagnosis Date  . Arthritis   . Persistent headaches   . Allergy   . H/O gastric bypass   . Heart murmur     since birth    Past Surgical History  Procedure Laterality Date  . Gastric bypass    . Abdominal hysterectomy    . Adenidectomy    . Tonsillectomy    . Anterior cervical decomp/discectomy fusion  03/13/2012    Procedure: ANTERIOR CERVICAL DECOMPRESSION/DISCECTOMY FUSION 2 LEVELS;  Surgeon: Hewitt Shorts, MD;  Location: [facility];  Service: Neurosurgery;  Laterality: N/A;  Cervical Five-Six Cervical Six-Seven Anterior Cervical Decompression with fusion plating and bonegraft  . Lumbar laminectomy/decompression microdiscectomy Left 05/14/2013    Procedure: LUMBAR LAMINECTOMY/DECOMPRESSION MICRODISCECTOMY 1 LEVEL;  Surgeon: Hewitt Shorts, MD;  Location: [facility];  Service: Neurosurgery;  Laterality: Left;  LEFT Lumbar four-five4 extraforaminal microdiskectomy    Prescriptions prior to admission  Medication Sig Dispense Refill  . ACETAMINOPHEN-BUTALBITAL 50-650 MG TABS Take 1 tablet by mouth every 6 (six) hours as needed (headache).      . calcium carbonate (OS-CAL) 600 MG TABS tablet Take 600 mg by mouth daily with breakfast.      . gabapentin (NEURONTIN) 300 MG capsule Take 300 mg by mouth 2 (two) times daily.      . nabumetone (RELAFEN) 500 MG tablet Take 500 mg  by mouth 2 (two) times daily.      Marland Kitchen oxyCODONE (OXY IR/ROXICODONE) 5 MG immediate release tablet Take 1-2 tablets (5-10 mg total) by mouth every 4 (four) hours as needed.  80 tablet  0   Allergies  Allergen Reactions  . Other Itching and Rash    Cat Dander  . Tramadol Other (See Comments)    Burning sensation  . Fish Allergy Rash  . Penicillins Itching and Rash  . Shellfish Allergy Rash    History  Substance Use Topics  . Smoking status: Current Every Day Smoker -- 0.10 packs/day for 35 years    Types: Cigarettes  . Smokeless tobacco: Not on file  . Alcohol Use: No    Family History  Problem Relation Age of Onset  . Hyperlipidemia Father   . Hypertension Father   . Diabetes Father      Review of Systems A comprehensive review of systems was negative.  Objective: Vital signs in last 24 hours: Temp:  [98.3 F (36.8 C)] 98.3 F (36.8 C) (11/06 1053) Pulse Rate:  [86] 86 (11/06 1053) Resp:  [20] 20 (11/06 1053) BP: (140)/(64) 140/64 mmHg (11/06 1053) SpO2:  [100 %] 100 % (11/06 1053)  EXAM: Patient is a well-developed well-nourished white female in discomfort, but no acute distress. Lungs are clear to auscultation , the patient has symmetrical respiratory excursion. Heart has a regular rate and rhythm normal S1 and S2 no murmur.   Abdomen is soft nontender  nondistended bowel sounds are present. Extremity examination shows no clubbing cyanosis or edema. Motor examination shows 5 over 5 strength in the upper extremities including the deltoid biceps triceps and intrinsics and grip. Sensation is intact to pinprick throughout the digits of the upper extremities. Reflexes are symmetrical and without evidence of pathologic reflexes. Patient has a normal gait and stance.   Data Review:CBC    Component Value Date/Time   WBC 6.7 10/30/2013 0946   RBC 4.45 10/30/2013 0946   HGB 10.7* 10/30/2013 0946   HCT 33.1* 10/30/2013 0946   PLT 276 10/30/2013 0946   MCV 74.4* 10/30/2013 0946    MCH 24.0* 10/30/2013 0946   MCHC 32.3 10/30/2013 0946   RDW 17.2* 10/30/2013 0946                          BMET    Component Value Date/Time   NA 139 10/30/2013 0946   K 4.4 10/30/2013 0946   CL 105 10/30/2013 0946   CO2 26 10/30/2013 0946   GLUCOSE 89 10/30/2013 0946   BUN 11 10/30/2013 0946   CREATININE 0.83 10/30/2013 0946   CALCIUM 8.8 10/30/2013 0946   GFRNONAA 81* 10/30/2013 0946   GFRAA >90 10/30/2013 0946     Assessment/Plan: Patient admitted with a 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 She has had continued disabling neck pain. She is admitted now for a C5-6 posterior cervical arthrodesis.  I've discussed with the patient the nature of his condition, the nature the surgical procedure, the typical length of surgery, hospital stay, and overall recuperation. We discussed limitations postoperatively. I discussed risks of surgery including risks of infection, bleeding, possibly need for transfusion, the risk of nerve root dysfunction with pain, weakness, numbness, or paresthesias, the risk of spinal cord dysfunction with paralysis of all 4 limbs and quadriplegia, and the risk of dural tear and CSF leakage and possible need for further surgery, the risk of failure of the arthrodesis and the possible need for further surgery, and the risk of anesthetic complications including myocardial infarction, stroke, pneumonia, and death. We also discussed the need for postoperative immobilization in a cervical collar. Understanding all this the patient does wish to proceed with surgery and is admitted for such.    Hewitt Shorts, MD 11/05/2013 11:24 AM

## 2013-11-05 NOTE — Anesthesia Preprocedure Evaluation (Addendum)
Anesthesia Evaluation  Patient identified by MRN, date of birth, ID band Patient awake    Reviewed: Allergy & Precautions, H&P , NPO status , Patient's Chart, lab work & pertinent test results, reviewed documented beta blocker date and time   History of Anesthesia Complications Negative for: history of anesthetic complications  Airway Mallampati: II TM Distance: >3 FB Neck ROM: Limited    Dental  (+) Edentulous Upper, Edentulous Lower and Dental Advisory Given   Pulmonary former smoker,  4 cigs/day x 32 years. Quit May 2014 breath sounds clear to auscultation        Cardiovascular negative cardio ROS  + Valvular Problems/Murmurs Rhythm:Regular Rate:Normal     Neuro/Psych  Headaches, negative psych ROS   GI/Hepatic Neg liver ROS, History of gastric bypass surgery. CE   Endo/Other  Morbid obesity  Renal/GU negative Renal ROS     Musculoskeletal negative musculoskeletal ROS (+) Arthritis -,   Abdominal   Peds  Hematology negative hematology ROS (+)   Anesthesia Other Findings   Reproductive/Obstetrics negative OB ROS                          Anesthesia Physical Anesthesia Plan  ASA: II  Anesthesia Plan: General   Post-op Pain Management:    Induction: Intravenous  Airway Management Planned: Oral ETT  Additional Equipment:   Intra-op Plan:   Post-operative Plan: Extubation in OR  Informed Consent: I have reviewed the patients History and Physical, chart, labs and discussed the procedure including the risks, benefits and alternatives for the proposed anesthesia with the patient or authorized representative who has indicated his/her understanding and acceptance.   Dental advisory given  Plan Discussed with: CRNA, Surgeon and Anesthesiologist  Anesthesia Plan Comments:        Anesthesia Quick Evaluation

## 2013-11-06 MED ORDER — PNEUMOCOCCAL VAC POLYVALENT 25 MCG/0.5ML IJ INJ
0.5000 mL | INJECTION | INTRAMUSCULAR | Status: AC
  Administered 2013-11-06: 0.5 mL via INTRAMUSCULAR
  Filled 2013-11-06: qty 0.5

## 2013-11-06 NOTE — Progress Notes (Signed)
UR COMPLETED  

## 2013-11-06 NOTE — Progress Notes (Signed)
Filed Vitals:   11/05/13 1945 11/05/13 2000 11/05/13 2337 11/06/13 0325  BP: 136/81 136/83 118/79 108/67  Pulse: 74 82 92 85  Temp: 96.8 F (36 C) 98.3 F (36.8 C) 98.9 F (37.2 C) 99.3 F (37.4 C)  TempSrc:  Oral Oral Oral  Resp: 19 18 18 16   SpO2: 100% 94% 96% 100%    Patient comfortable, has ambulated once in hall. Dressing clean and dry. Moving all 4 extremities well. Has voided.  Plan: Have encouraged patient to steadily increase ambulation through the day. We'll continue to progress through postoperative recovery.  Hewitt Shorts, MD 11/06/2013, 7:14 AM

## 2013-11-07 MED ORDER — CYCLOBENZAPRINE HCL 10 MG PO TABS: 10.0000 mg | ORAL_TABLET | Freq: Three times a day (TID) | ORAL | Status: DC | PRN

## 2013-11-07 MED ORDER — OXYCODONE-ACETAMINOPHEN 5-325 MG PO TABS: 1.0000 | ORAL_TABLET | ORAL | Status: DC | PRN

## 2013-11-07 NOTE — Discharge Summary (Signed)
Physician Discharge Summary  Patient ID: Monique Patton MRN: [id number] DOB/AGE: 1965/07/13 48 y.o.  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 Postoperatively she is made steady progress. She is up and living in the halls. The wound is healing well. She is being discharged home with instructions regarding wound care and activities. She is to return for followup with me in 3 weeks.  Discharge Exam: Blood pressure 112/70, pulse 87, temperature 99.6 F (37.6 C), temperature source Oral, resp. rate 18, SpO2 94.00%.  Disposition: Home     Medication List         ACETAMINOPHEN-BUTALBITAL 50-650 MG Tabs  Take 1 tablet by mouth every 6 (six) hours as needed (headache).     calcium carbonate 600 MG Tabs tablet  Commonly known as:  OS-CAL  Take 600 mg by mouth daily with breakfast.     cyclobenzaprine 10 MG tablet  Commonly known as:  FLEXERIL  Take 1 tablet (10 mg total) by mouth 3 (three) times daily as needed for muscle spasms.     gabapentin 300 MG capsule  Commonly known as:  NEURONTIN  Take 300 mg by mouth 2 (two) times daily.     nabumetone 500 MG tablet  Commonly known as:  RELAFEN  Take 500 mg by mouth 2 (two) times daily.     oxyCODONE 5 MG immediate release tablet  Commonly known as:  Oxy IR/ROXICODONE  Take 1-2 tablets (5-10 mg total) by mouth every 4 (four) hours as needed.     oxyCODONE-acetaminophen 5-325 MG per tablet  Commonly known as:  PERCOCET/ROXICET  Take 1-2 tablets by mouth every 4 (four) hours as needed for moderate pain or severe pain.         Signed: Hewitt Shorts, MD 11/07/2013, 8:55 AM

## 2013-11-07 NOTE — Progress Notes (Signed)
Pt. Alert and oriented,follows simple instructions, denies pain. Incision area without swelling, redness or S/S of infection. Voiding adequate clear yellow urine. Moving all extremities well and vitals stable and documented. Patient discharged home with spouse. Posterior Cervical surgery notes instructions given to patient and family member for home safety and precautions. Pt. and family stated understanding of instructions given. Pain meds given per Pt.'s request for pain and discomfort of ride home

## 2013-11-18 ENCOUNTER — Encounter (HOSPITAL_COMMUNITY): Payer: Self-pay | Admitting: Neurosurgery

## 2014-02-09 DIAGNOSIS — M5030 Other cervical disc degeneration, unspecified cervical region: Secondary | ICD-10-CM | POA: Insufficient documentation

## 2014-11-03 ENCOUNTER — Other Ambulatory Visit: Payer: Self-pay | Admitting: Neurosurgery

## 2014-11-03 DIAGNOSIS — S129XXD Fracture of neck, unspecified, subsequent encounter: Secondary | ICD-10-CM

## 2014-11-10 ENCOUNTER — Other Ambulatory Visit: Payer: BC Managed Care – PPO

## 2014-11-19 ENCOUNTER — Ambulatory Visit
Admission: RE | Admit: 2014-11-19 | Discharge: 2014-11-19 | Disposition: A | Discharge status: Home / Self Care | Payer: BC Managed Care – PPO | Source: Ambulatory Visit | Attending: Neurosurgery | Admitting: Neurosurgery

## 2014-11-19 DIAGNOSIS — E559 Vitamin D deficiency, unspecified: Secondary | ICD-10-CM | POA: Insufficient documentation

## 2014-11-19 DIAGNOSIS — S129XXD Fracture of neck, unspecified, subsequent encounter: Secondary | ICD-10-CM

## 2015-10-13 ENCOUNTER — Other Ambulatory Visit: Payer: Self-pay | Admitting: Neurosurgery

## 2015-10-13 DIAGNOSIS — Z981 Arthrodesis status: Secondary | ICD-10-CM

## 2015-10-19 ENCOUNTER — Ambulatory Visit
Admission: RE | Admit: 2015-10-19 | Discharge: 2015-10-19 | Disposition: A | Discharge status: Home / Self Care | Payer: BC Managed Care – PPO | Source: Ambulatory Visit | Attending: Neurosurgery | Admitting: Neurosurgery

## 2015-10-19 DIAGNOSIS — Z981 Arthrodesis status: Secondary | ICD-10-CM

## 2015-10-19 DIAGNOSIS — M50221 Other cervical disc displacement at C4-C5 level: Secondary | ICD-10-CM | POA: Diagnosis not present

## 2015-10-19 DIAGNOSIS — M47892 Other spondylosis, cervical region: Secondary | ICD-10-CM | POA: Insufficient documentation

## 2015-11-15 ENCOUNTER — Other Ambulatory Visit: Payer: Self-pay | Admitting: Neurosurgery

## 2015-11-15 DIAGNOSIS — M4722 Other spondylosis with radiculopathy, cervical region: Secondary | ICD-10-CM

## 2015-12-13 ENCOUNTER — Ambulatory Visit
Admission: RE | Admit: 2015-12-13 | Discharge: 2015-12-13 | Disposition: A | Discharge status: Home / Self Care | Payer: BC Managed Care – PPO | Source: Ambulatory Visit | Attending: Neurosurgery | Admitting: Neurosurgery

## 2015-12-13 DIAGNOSIS — M50221 Other cervical disc displacement at C4-C5 level: Secondary | ICD-10-CM | POA: Insufficient documentation

## 2015-12-13 DIAGNOSIS — M4802 Spinal stenosis, cervical region: Secondary | ICD-10-CM | POA: Diagnosis not present

## 2015-12-13 DIAGNOSIS — Z9889 Other specified postprocedural states: Secondary | ICD-10-CM | POA: Insufficient documentation

## 2015-12-13 DIAGNOSIS — R51 Headache: Secondary | ICD-10-CM | POA: Diagnosis not present

## 2015-12-13 DIAGNOSIS — M4722 Other spondylosis with radiculopathy, cervical region: Secondary | ICD-10-CM | POA: Insufficient documentation

## 2016-04-04 ENCOUNTER — Other Ambulatory Visit: Payer: Self-pay | Admitting: Sports Medicine

## 2016-04-04 DIAGNOSIS — M25561 Pain in right knee: Secondary | ICD-10-CM

## 2016-04-09 ENCOUNTER — Ambulatory Visit
Admission: RE | Admit: 2016-04-09 | Discharge: 2016-04-09 | Disposition: A | Discharge status: Home / Self Care | Payer: BC Managed Care – PPO | Source: Ambulatory Visit | Attending: Sports Medicine | Admitting: Sports Medicine

## 2016-04-09 DIAGNOSIS — M25561 Pain in right knee: Secondary | ICD-10-CM

## 2016-06-15 DIAGNOSIS — M5440 Lumbago with sciatica, unspecified side: Secondary | ICD-10-CM | POA: Insufficient documentation

## 2017-07-16 DIAGNOSIS — G4486 Cervicogenic headache: Secondary | ICD-10-CM | POA: Insufficient documentation

## 2017-08-22 ENCOUNTER — Other Ambulatory Visit: Payer: Self-pay | Admitting: Internal Medicine

## 2017-08-22 DIAGNOSIS — Z1231 Encounter for screening mammogram for malignant neoplasm of breast: Secondary | ICD-10-CM

## 2017-09-05 ENCOUNTER — Ambulatory Visit
Admission: RE | Admit: 2017-09-05 | Discharge: 2017-09-05 | Disposition: A | Discharge status: Home / Self Care | Payer: BC Managed Care – PPO | Source: Ambulatory Visit | Attending: Internal Medicine | Admitting: Internal Medicine

## 2017-09-05 DIAGNOSIS — Z1231 Encounter for screening mammogram for malignant neoplasm of breast: Secondary | ICD-10-CM | POA: Diagnosis not present

## 2018-01-06 ENCOUNTER — Encounter: Payer: Self-pay | Admitting: *Deleted

## 2018-01-07 ENCOUNTER — Other Ambulatory Visit: Payer: Self-pay

## 2018-01-07 ENCOUNTER — Ambulatory Visit
Admission: RE | Admit: 2018-01-07 | Discharge: 2018-01-07 | Disposition: A | Discharge status: Home / Self Care | Payer: BC Managed Care – PPO | Source: Ambulatory Visit | Attending: Internal Medicine | Admitting: Internal Medicine

## 2018-01-07 ENCOUNTER — Encounter: Payer: Self-pay | Admitting: Student

## 2018-01-07 ENCOUNTER — Ambulatory Visit: Payer: BC Managed Care – PPO | Admitting: Anesthesiology

## 2018-01-07 ENCOUNTER — Encounter
Admission: RE | Disposition: A | Discharge status: Home / Self Care | Payer: Self-pay | Source: Ambulatory Visit | Attending: Internal Medicine

## 2018-01-07 DIAGNOSIS — E559 Vitamin D deficiency, unspecified: Secondary | ICD-10-CM | POA: Diagnosis not present

## 2018-01-07 DIAGNOSIS — Z8371 Family history of colonic polyps: Secondary | ICD-10-CM | POA: Diagnosis not present

## 2018-01-07 DIAGNOSIS — Z880 Allergy status to penicillin: Secondary | ICD-10-CM | POA: Diagnosis not present

## 2018-01-07 DIAGNOSIS — K640 First degree hemorrhoids: Secondary | ICD-10-CM | POA: Insufficient documentation

## 2018-01-07 DIAGNOSIS — Z9884 Bariatric surgery status: Secondary | ICD-10-CM | POA: Diagnosis not present

## 2018-01-07 DIAGNOSIS — Z1211 Encounter for screening for malignant neoplasm of colon: Secondary | ICD-10-CM | POA: Insufficient documentation

## 2018-01-07 DIAGNOSIS — Z79899 Other long term (current) drug therapy: Secondary | ICD-10-CM | POA: Diagnosis not present

## 2018-01-07 HISTORY — PX: COLONOSCOPY WITH PROPOFOL: SHX5780

## 2018-01-07 HISTORY — DX: Vitamin D deficiency, unspecified: E55.9

## 2018-01-07 HISTORY — DX: Dermatitis, unspecified: L30.9

## 2018-01-07 SURGERY — COLONOSCOPY WITH PROPOFOL
Anesthesia: General

## 2018-01-07 MED ORDER — LIDOCAINE HCL (PF) 2 % IJ SOLN
INTRAMUSCULAR | Status: AC
  Filled 2018-01-07: qty 10

## 2018-01-07 MED ORDER — PROPOFOL 500 MG/50ML IV EMUL
INTRAVENOUS | Status: DC | PRN
  Administered 2018-01-07: 150 ug/kg/min via INTRAVENOUS

## 2018-01-07 MED ORDER — PROPOFOL 500 MG/50ML IV EMUL
INTRAVENOUS | Status: AC
  Filled 2018-01-07: qty 50

## 2018-01-07 MED ORDER — KETOROLAC TROMETHAMINE 30 MG/ML IJ SOLN
INTRAMUSCULAR | Status: AC
  Filled 2018-01-07: qty 1

## 2018-01-07 MED ORDER — PROPOFOL 10 MG/ML IV BOLUS
INTRAVENOUS | Status: DC | PRN
  Administered 2018-01-07: 60 mg via INTRAVENOUS

## 2018-01-07 MED ORDER — MIDAZOLAM HCL 2 MG/2ML IJ SOLN
INTRAMUSCULAR | Status: DC | PRN
  Administered 2018-01-07: 2 mg via INTRAVENOUS

## 2018-01-07 MED ORDER — LIDOCAINE HCL (CARDIAC) 20 MG/ML IV SOLN
INTRAVENOUS | Status: DC | PRN
  Administered 2018-01-07: 100 mg via INTRAVENOUS

## 2018-01-07 MED ORDER — SODIUM CHLORIDE 0.9 % IV SOLN
INTRAVENOUS | Status: DC
  Administered 2018-01-07 (×3): via INTRAVENOUS

## 2018-01-07 MED ORDER — MIDAZOLAM HCL 2 MG/2ML IJ SOLN
INTRAMUSCULAR | Status: AC
  Filled 2018-01-07: qty 2

## 2018-01-07 NOTE — Anesthesia Post-op Follow-up Note (Signed)
Anesthesia QCDR form completed.        

## 2018-01-07 NOTE — Interval H&P Note (Signed)
History and Physical Interval Note:  01/07/2018 3:00 PM  Monique Patton  has presented today for surgery, with the diagnosis of SCREENING  The various methods of treatment have been discussed with the patient and family. After consideration of risks, benefits and other options for treatment, the patient has consented to  Procedure(s): COLONOSCOPY WITH PROPOFOL (N/A) as a surgical intervention .  The patient's history has been reviewed, patient examined, no change in status, stable for surgery.  I have reviewed the patient's chart and labs.  Questions were answered to the patient's satisfaction.     [place], [place]

## 2018-01-07 NOTE — Transfer of Care (Signed)
Immediate Anesthesia Transfer of Care Note  Patient: Monique Patton  Procedure(s) Performed: COLONOSCOPY WITH PROPOFOL (N/A )  Patient Location: Endoscopy Unit  Anesthesia Type:General  Level of Consciousness: drowsy and patient cooperative  Airway & Oxygen Therapy: Patient Spontanous Breathing and Patient connected to nasal cannula oxygen  Post-op Assessment: Report given to RN, Post -op Vital signs reviewed and stable and Patient moving all extremities X 4  Post vital signs: Reviewed and stable  Last Vitals:  Vitals:   01/07/18 1359 01/07/18 1520  BP: 140/86   Pulse: 84   Resp: 18   Temp: (!) 35.8 C (!) (P) 35.7 C  SpO2: 100%     Last Pain:  Vitals:   01/07/18 1359  TempSrc: Tympanic         Complications: No apparent anesthesia complications

## 2018-01-07 NOTE — Anesthesia Preprocedure Evaluation (Signed)
Anesthesia Evaluation  Patient identified by MRN, date of birth, ID band Patient awake    Reviewed: Allergy & Precautions, NPO status , Patient's Chart, lab work & pertinent test results  History of Anesthesia Complications Negative for: history of anesthetic complications  Airway Mallampati: III       Dental   Pulmonary neg sleep apnea, neg COPD, former smoker,           Cardiovascular (-) hypertension(-) Past MI and (-) CHF (-) dysrhythmias + Valvular Problems/Murmurs (murmur, no tx)      Neuro/Psych neg Seizures    GI/Hepatic Neg liver ROS, neg GERD  ,  Endo/Other  neg diabetesMorbid obesity  Renal/GU negative Renal ROS     Musculoskeletal   Abdominal   Peds  Hematology   Anesthesia Other Findings   Reproductive/Obstetrics                             Anesthesia Physical Anesthesia Plan  ASA: III  Anesthesia Plan: General   Post-op Pain Management:    Induction: Intravenous  PONV Risk Score and Plan: 3 and TIVA, Propofol infusion and Ondansetron  Airway Management Planned: Nasal Cannula  Additional Equipment:   Intra-op Plan:   Post-operative Plan:   Informed Consent: I have reviewed the patients History and Physical, chart, labs and discussed the procedure including the risks, benefits and alternatives for the proposed anesthesia with the patient or authorized representative who has indicated his/her understanding and acceptance.     Plan Discussed with:   Anesthesia Plan Comments:         Anesthesia Quick Evaluation

## 2018-01-07 NOTE — H&P (Signed)
Outpatient short stay form Pre-procedure 01/07/2018 2:59 PM Monique Patton Monique Patton, M.D.  Primary Physician: Dr. Ellsworth Lennoxejan-Sie  Reason for visit: Family hx of colon polyps, colon cancer screening.   History of present illness:  Patient presents for colon cancer screening. The patient denies abdominal pain, abnormal weight loss or rectal bleeding.     Current Facility-Administered Medications:  .  0.9 %  sodium chloride infusion, , Intravenous, Continuous, [place], Monique Nearingeodoro K, MD, Last Rate: 20 mL/hr at 01/07/18 1425  Medications Prior to Admission  Medication Sig Dispense Refill Last Dose  . ACETAMINOPHEN-BUTALBITAL 50-650 MG TABS Take 1 tablet by mouth every 6 (six) hours as needed (headache).   12/31/2017 at Unknown time  . calcium carbonate (OS-CAL) 600 MG TABS tablet Take 600 mg by mouth daily with breakfast.   11/10/2017 at Unknown time  . celecoxib (CELEBREX) 200 MG capsule Take 200 mg by mouth daily.   01/06/2018 at 0600  . cyclobenzaprine (FLEXERIL) 10 MG tablet Take 1 tablet (10 mg total) by mouth 3 (three) times daily as needed for muscle spasms. 60 tablet 1 01/05/2018 at Unknown time  . traMADol (ULTRAM) 50 MG tablet Take by mouth every 6 (six) hours as needed.   Past Week at Unknown time  . gabapentin (NEURONTIN) 300 MG capsule Take 300 mg by mouth 2 (two) times daily.   01/05/2017  . nabumetone (RELAFEN) 500 MG tablet Take 500 mg by mouth 2 (two) times daily.   Not Taking at Unknown time  . oxyCODONE (OXY IR/ROXICODONE) 5 MG immediate release tablet Take 1-2 tablets (5-10 mg total) by mouth every 4 (four) hours as needed. (Patient not taking: Reported on 01/07/2018) 80 tablet 0 Not Taking at Unknown time  . oxyCODONE-acetaminophen (PERCOCET/ROXICET) 5-325 MG per tablet Take 1-2 tablets by mouth every 4 (four) hours as needed for moderate pain or severe pain. (Patient not taking: Reported on 01/07/2018) 70 tablet 0 Not Taking at Unknown time     Allergies  Allergen Reactions  . Other Itching and  Rash    Cat Dander  . Fish Allergy Rash  . Penicillins Itching and Rash  . Shellfish Allergy Rash     Past Medical History:  Diagnosis Date  . Allergy   . Arthritis   . Eczema   . H/O gastric bypass   . Heart murmur    since birth  . Persistent headaches   . Vitamin D deficiency     Review of systems:      Physical Exam  General appearance: alert, cooperative and appears stated age Resp: clear to auscultation bilaterally Cardio: regular rate and rhythm, S1, S2 normal, no murmur, click, rub or gallop GI: soft, non-tender; bowel sounds normal; no masses,  no organomegaly Extremities: extremities normal, atraumatic, no cyanosis or edema     Planned procedures: Colonoscopy. The patient understands the nature of the planned procedure, indications, risks, alternatives and potential complications including but not limited to bleeding, infection, perforation, damage to internal organs and possible oversedation/side effects from anesthesia. The patient agrees and gives consent to proceed.  Please refer to procedure notes for findings, recommendations and patient disposition/instructions.    Monique Patton Monique Patton, M.D. Gastroenterology 01/07/2018  2:59 PM

## 2018-01-07 NOTE — Anesthesia Postprocedure Evaluation (Signed)
Anesthesia Post Note  Patient: Monique Patton  Procedure(s) Performed: COLONOSCOPY WITH PROPOFOL (N/A )  Patient location during evaluation: Endoscopy Anesthesia Type: General Level of consciousness: awake and alert Pain management: pain level controlled Vital Signs Assessment: post-procedure vital signs reviewed and stable Respiratory status: spontaneous breathing, nonlabored ventilation, respiratory function stable and patient connected to nasal cannula oxygen Cardiovascular status: blood pressure returned to baseline and stable Postop Assessment: no apparent nausea or vomiting Anesthetic complications: no     Last Vitals:  Vitals:   01/07/18 1530 01/07/18 1540  BP: 127/79 127/81  Pulse: 92 87  Resp: (!) 21 18  Temp:    SpO2: 100% 100%    Last Pain:  Vitals:   01/07/18 1520  TempSrc: Tympanic                 Gildardo Tickner S

## 2018-01-07 NOTE — Op Note (Signed)
[facility] Gastroenterology Patient Name: Monique Patton Procedure Date: 01/07/2018 2:54 PM MRN: [id number] Account #: [id number] Date of Birth: 1965/04/08 Admit Type: Outpatient Age: 53 Room: [facility] ENDO ROOM 4 Gender: Female Note Status: Finalized Procedure:            Colonoscopy Indications:          Colon cancer screening in patient at increased risk:                        Family history of 1st-degree relative with colon polyps Providers:            Boykin Nearing. Norma Fredrickson MD, MD Referring MD:         Silas Flood. Ellsworth Lennox, MD (Referring MD) Medicines:            Propofol per Anesthesia Complications:        No immediate complications. Procedure:            Pre-Anesthesia Assessment:                       - The risks and benefits of the procedure and the                        sedation options and risks were discussed with the                        patient. All questions were answered and informed                        consent was obtained.                       - Patient identification and proposed procedure were                        verified prior to the procedure by the nurse. The                        procedure was verified in the procedure room.                       - ASA Grade Assessment: II - A patient with mild                        systemic disease.                       - After reviewing the risks and benefits, the patient                        was deemed in satisfactory condition to undergo the                        procedure.                       After obtaining informed consent, the colonoscope was                        passed under direct vision. Throughout the procedure,  the patient's blood pressure, pulse, and oxygen                        saturations were monitored continuously. The                        Colonoscope was introduced through the anus and                        advanced to the the cecum, identified by  appendiceal                        orifice and ileocecal valve. The colonoscopy was                        performed without difficulty. The patient tolerated the                        procedure well. The quality of the bowel preparation                        was good. The ileocecal valve, appendiceal orifice, and                        rectum were photographed. Findings:      The perianal and digital rectal examinations were normal. Pertinent       negatives include normal sphincter tone and no palpable rectal lesions.      Non-bleeding internal hemorrhoids were found during retroflexion. The       hemorrhoids were Grade I (internal hemorrhoids that do not prolapse).      The exam was otherwise without abnormality. Impression:           - Non-bleeding internal hemorrhoids.                       - The examination was otherwise normal.                       - No specimens collected. Recommendation:       - Patient has a contact number available for                        emergencies. The signs and symptoms of potential                        delayed complications were discussed with the patient.                        Return to normal activities tomorrow. Written discharge                        instructions were provided to the patient.                       - Resume previous diet.                       - Continue present medications.                       - Repeat colonoscopy in 5 years for  screening purposes.                       - Return to GI office PRN.                       - The findings and recommendations were discussed with                        the patient and their spouse. Procedure Code(s):    --- Professional ---                       [id number], Colorectal cancer screening; colonoscopy on                        individual at high risk Diagnosis Code(s):    --- Professional ---                       Z83.71, Family history of colonic polyps                       K64.0,  First degree hemorrhoids CPT copyright 2016 [employer]. All rights reserved. The codes documented in this report are preliminary and upon coder review may  be revised to meet current compliance requirements. Stanton Kidneyeodoro K Brendt Dible MD, MD 01/07/2018 3:17:35 PM This report has been signed electronically. Number of Addenda: 0 Note Initiated On: 01/07/2018 2:54 PM Scope Withdrawal Time: 0 hours 6 minutes 23 seconds  Total Procedure Duration: 0 hours 11 minutes 10 seconds       [facility]

## 2018-01-09 ENCOUNTER — Encounter: Payer: Self-pay | Admitting: Internal Medicine

## 2018-03-07 ENCOUNTER — Ambulatory Visit (INDEPENDENT_AMBULATORY_CARE_PROVIDER_SITE_OTHER): Payer: BC Managed Care – PPO | Admitting: Orthopaedic Surgery

## 2018-03-07 DIAGNOSIS — M1711 Unilateral primary osteoarthritis, right knee: Secondary | ICD-10-CM | POA: Diagnosis not present

## 2018-03-07 DIAGNOSIS — M1712 Unilateral primary osteoarthritis, left knee: Secondary | ICD-10-CM

## 2018-03-07 MED ORDER — TRAMADOL HCL 50 MG PO TABS: 50.0000 mg | ORAL_TABLET | Freq: Three times a day (TID) | ORAL | 2 refills | Status: DC | PRN

## 2018-03-07 MED ORDER — DICLOFENAC SODIUM 1 % TD GEL: 2.0000 g | Freq: Four times a day (QID) | TRANSDERMAL | 5 refills | Status: DC

## 2018-03-07 NOTE — Progress Notes (Signed)
Office Visit Note   Patient: Monique Patton           Date of Birth: Aug 12, 1965           MRN: [id number] Visit Date: 03/07/2018              Requested by: Sherron Monday, MD [address] [place], [place] [address] PCP: Sherron Monday, MD   Assessment & Plan: Visit Diagnoses:  1. Unilateral primary osteoarthritis, right knee   2. Unilateral primary osteoarthritis, left knee     Plan: Impression is 53 year old female with bilateral knee osteoarthritis that has failed conservative treatment.  Patient also has increase of BMI of 49.  We had a long discussion today regarding the increased risk related to total joint replacement due to increased BMI.  My recommendation is for weight loss and continued conservative treatment.  We have made a referral to the [facility] bariatric clinic.  I refilled her prescription for Voltaren gel and tramadol.  Questions encouraged and answered.  Follow-up as needed.  Follow-Up Instructions: Return if symptoms worsen or fail to improve.   Orders:  No orders of the defined types were placed in this encounter.  Meds ordered this encounter  Medications  . diclofenac sodium (VOLTAREN) 1 % GEL    Sig: Apply 2 g topically 4 (four) times daily.    Dispense:  1 Tube    Refill:  5  . traMADol (ULTRAM) 50 MG tablet    Sig: Take 1-2 tablets (50-100 mg total) by mouth 3 (three) times daily as needed.    Dispense:  60 tablet    Refill:  2      Procedures: No procedures performed   Clinical Data: No additional findings.   Subjective: Chief Complaint  Patient presents with  . Left Knee - Follow-up  . Right Knee - Follow-up    Patient is a 53 year old female comes in for second opinion for bilateral knee pain and degenerative joint disease.  She has been treated conservatively with cortisone injections as well as HA injections among other conservative treatment.  She has been told that the next step of the total knee replacement.  Sounds like she  is having significant difficulty with ADLs and with chronic pain.  Her knees give out.  Denies any swelling.    Review of Systems  Constitutional: Negative.   HENT: Negative.   Eyes: Negative.   Respiratory: Negative.   Cardiovascular: Negative.   Endocrine: Negative.   Musculoskeletal: Negative.   Neurological: Negative.   Hematological: Negative.   Psychiatric/Behavioral: Negative.   All other systems reviewed and are negative.    Objective: Vital Signs: There were no vitals taken for this visit.  Physical Exam  Constitutional: She is oriented to person, place, and time. She appears well-developed and well-nourished.  HENT:  Head: Normocephalic and atraumatic.  Eyes: EOM are normal.  Neck: Neck supple.  Pulmonary/Chest: Effort normal.  Abdominal: Soft.  Neurological: She is alert and oriented to person, place, and time.  Skin: Skin is warm. Capillary refill takes less than 2 seconds.  Psychiatric: She has a normal mood and affect. Her behavior is normal. Judgment and thought content normal.  Nursing note and vitals reviewed.   Ortho Exam Bilateral knee exam shows no joint effusion.  Collaterals and cruciates are grossly intact.  Pain is localized more on the medial side of both knees. Specialty Comments:  No specialty comments available.  Imaging: No results found.   PMFS History:  Patient Active Problem List   Diagnosis Date Noted  . 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 Neck pain 02/12/2012  . Back pain 02/12/2012  . H/O gastric bypass    Past Medical History:  Diagnosis Date  . Allergy   . Arthritis   . Eczema   . H/O gastric bypass   . Heart murmur    since birth  . Persistent headaches   . Vitamin D deficiency     Family History  Problem Relation Age of Onset  . Hyperlipidemia Father   . Hypertension Father   . Diabetes Father   . Breast cancer Neg Hx     Past Surgical History:  Procedure Laterality Date  . ABDOMINAL HYSTERECTOMY    .  ADENIDECTOMY    . ANTERIOR CERVICAL DECOMP/DISCECTOMY FUSION  03/13/2012   Procedure: ANTERIOR CERVICAL DECOMPRESSION/DISCECTOMY FUSION 2 LEVELS;  Surgeon: Hewitt Shortsobert W Nudelman, MD;  Location: [facility];  Service: Neurosurgery;  Laterality: N/A;  Cervical Five-Six Cervical Six-Seven Anterior Cervical Decompression with fusion plating and bonegraft  . COLONOSCOPY WITH PROPOFOL N/A 01/07/2018   Procedure: COLONOSCOPY WITH PROPOFOL;  Surgeon: Toledo, Boykin Nearingeodoro K, MD;  Location: [facility];  Service: Gastroenterology;  Laterality: N/A;  . GASTRIC BYPASS    . LUMBAR LAMINECTOMY/DECOMPRESSION MICRODISCECTOMY Left 05/14/2013   Procedure: LUMBAR LAMINECTOMY/DECOMPRESSION MICRODISCECTOMY 1 LEVEL;  Surgeon: Hewitt Shortsobert W Nudelman, MD;  Location: [facility];  Service: Neurosurgery;  Laterality: Left;  LEFT Lumbar four-five4 extraforaminal microdiskectomy  . POSTERIOR CERVICAL FUSION/FORAMINOTOMY N/A 11/05/2013   Procedure: Cervical five-six posterior arthrodesis and instrumentation;  Surgeon: Hewitt Shortsobert W Nudelman, MD;  Location: [facility];  Service: Neurosurgery;  Laterality: N/A;  Cervical five-six posterior arthrodesis and instrumentation  . TONSILLECTOMY     Social History   Occupational History  . Not on file  Tobacco Use  . Smoking status: Former Smoker    Packs/day: 0.10    Years: 35.00    Pack years: 3.50    Types: Cigarettes    Last attempt to quit: 01/07/2010    Years since quitting: 8.1  . Smokeless tobacco: Never Used  Substance and Sexual Activity  . Alcohol use: No  . Drug use: No  . Sexual activity: Not on file

## 2018-09-07 ENCOUNTER — Encounter: Payer: Self-pay | Admitting: Medical Oncology

## 2018-09-07 ENCOUNTER — Emergency Department: Payer: BC Managed Care – PPO

## 2018-09-07 ENCOUNTER — Emergency Department
Admission: EM | Admit: 2018-09-07 | Discharge: 2018-09-07 | Disposition: A | Discharge status: Home / Self Care | Payer: BC Managed Care – PPO | Attending: Emergency Medicine | Admitting: Emergency Medicine

## 2018-09-07 DIAGNOSIS — S93402A Sprain of unspecified ligament of left ankle, initial encounter: Secondary | ICD-10-CM | POA: Diagnosis not present

## 2018-09-07 DIAGNOSIS — S99912A Unspecified injury of left ankle, initial encounter: Secondary | ICD-10-CM | POA: Diagnosis present

## 2018-09-07 DIAGNOSIS — Y999 Unspecified external cause status: Secondary | ICD-10-CM | POA: Insufficient documentation

## 2018-09-07 DIAGNOSIS — X509XXA Other and unspecified overexertion or strenuous movements or postures, initial encounter: Secondary | ICD-10-CM | POA: Insufficient documentation

## 2018-09-07 DIAGNOSIS — Y939 Activity, unspecified: Secondary | ICD-10-CM | POA: Diagnosis not present

## 2018-09-07 DIAGNOSIS — S46911A Strain of unspecified muscle, fascia and tendon at shoulder and upper arm level, right arm, initial encounter: Secondary | ICD-10-CM | POA: Diagnosis not present

## 2018-09-07 DIAGNOSIS — Y929 Unspecified place or not applicable: Secondary | ICD-10-CM | POA: Insufficient documentation

## 2018-09-07 DIAGNOSIS — Z87891 Personal history of nicotine dependence: Secondary | ICD-10-CM | POA: Diagnosis not present

## 2018-09-07 DIAGNOSIS — Z79899 Other long term (current) drug therapy: Secondary | ICD-10-CM | POA: Insufficient documentation

## 2018-09-07 MED ORDER — CYCLOBENZAPRINE HCL 5 MG PO TABS: 5.0000 mg | ORAL_TABLET | Freq: Three times a day (TID) | ORAL | 0 refills | Status: DC | PRN

## 2018-09-07 MED ORDER — CYCLOBENZAPRINE HCL 10 MG PO TABS
10.0000 mg | ORAL_TABLET | Freq: Once | ORAL | Status: AC
  Administered 2018-09-07: 10 mg via ORAL
  Filled 2018-09-07: qty 1

## 2018-09-07 MED ORDER — KETOROLAC TROMETHAMINE 30 MG/ML IJ SOLN
30.0000 mg | Freq: Once | INTRAMUSCULAR | Status: AC
  Administered 2018-09-07: 30 mg via INTRAMUSCULAR
  Filled 2018-09-07: qty 1

## 2018-09-07 MED ORDER — KETOROLAC TROMETHAMINE 10 MG PO TABS: 10.0000 mg | ORAL_TABLET | Freq: Three times a day (TID) | ORAL | 0 refills | Status: DC

## 2018-09-07 NOTE — ED Notes (Signed)
Pt states twisted her left ankle yesterday and fell onto her rt shoulder. xr's ordered. Swelling noted to left ankle and pt states pain to rt shoulder.

## 2018-09-07 NOTE — ED Provider Notes (Signed)
[facility] Emergency Department Provider Note ____________________________________________  Time seen: 1720  I have reviewed the triage vital signs and the nursing notes.  HISTORY  Chief Complaint  Ankle Pain  HPI Monique Patton is a 53 y.o. female presents to the ED accompanied by her husband, for evaluation of injury sustained following mechanical fall.  Patient describes the injury yesterday to her left ankle causing her to fall after a twisting mechanism.  She fell, landing onto her right shoulder.  She denies any head injury, loss of consciousness, or weakness.  She was concerned because she is status post a cervical spine fusion, and has had pain across the upper trapezius muscle towards the neck.  She denies any distal paresthesias, grip changes, or weakness.  She also denies any pain, shortness of breath, or lacerations.  She presents primarily for evaluation of pain to the right shoulder and left lateral ankle.  Past Medical History:  Diagnosis Date  . Allergy   . Arthritis   . Eczema   . H/O gastric bypass   . Heart murmur    since birth  . Persistent headaches   . Vitamin D deficiency     Patient Active Problem List   Diagnosis Date Noted  . 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 Neck pain 02/12/2012  . Back pain 02/12/2012  . H/O gastric bypass     Past Surgical History:  Procedure Laterality Date  . ABDOMINAL HYSTERECTOMY    . ADENIDECTOMY    . ANTERIOR CERVICAL DECOMP/DISCECTOMY FUSION  03/13/2012   Procedure: ANTERIOR CERVICAL DECOMPRESSION/DISCECTOMY FUSION 2 LEVELS;  Surgeon: Hewitt Shorts, MD;  Location: [facility];  Service: Neurosurgery;  Laterality: N/A;  Cervical Five-Six Cervical Six-Seven Anterior Cervical Decompression with fusion plating and bonegraft  . COLONOSCOPY WITH PROPOFOL N/A 01/07/2018   Procedure: COLONOSCOPY WITH PROPOFOL;  Surgeon: Toledo, Boykin Nearing, MD;  Location: [facility];  Service: Gastroenterology;   Laterality: N/A;  . GASTRIC BYPASS    . LUMBAR LAMINECTOMY/DECOMPRESSION MICRODISCECTOMY Left 05/14/2013   Procedure: LUMBAR LAMINECTOMY/DECOMPRESSION MICRODISCECTOMY 1 LEVEL;  Surgeon: Hewitt Shorts, MD;  Location: [facility];  Service: Neurosurgery;  Laterality: Left;  LEFT Lumbar four-five4 extraforaminal microdiskectomy  . POSTERIOR CERVICAL FUSION/FORAMINOTOMY N/A 11/05/2013   Procedure: Cervical five-six posterior arthrodesis and instrumentation;  Surgeon: Hewitt Shorts, MD;  Location: [facility];  Service: Neurosurgery;  Laterality: N/A;  Cervical five-six posterior arthrodesis and instrumentation  . TONSILLECTOMY      Prior to Admission medications   Medication Sig Start Date End Date Taking? Authorizing Provider  ACETAMINOPHEN-BUTALBITAL 50-650 MG TABS Take 1 tablet by mouth every 6 (six) hours as needed (headache).    [provider]  celecoxib (CELEBREX) 200 MG capsule Take 200 mg by mouth daily.    [provider]  cyclobenzaprine (FLEXERIL) 5 MG tablet Take 1 tablet (5 mg total) by mouth 3 (three) times daily as needed for muscle spasms. 09/07/18   Derrisha Foos, Charlesetta Ivory, PA-C  diclofenac sodium (VOLTAREN) 1 % GEL Apply 2 g topically 4 (four) times daily. 03/07/18   Tarry Kos, MD  ketorolac (TORADOL) 10 MG tablet Take 1 tablet (10 mg total) by mouth every 8 (eight) hours. 09/07/18   Reneshia Zuccaro, Charlesetta Ivory, PA-C    Allergies Other; Fish allergy; Penicillins; and Shellfish allergy  Family History  Problem Relation Age of Onset  . Hyperlipidemia Father   . Hypertension Father   . Diabetes Father   . Breast  cancer Neg Hx     Social History Social History   Tobacco Use  . Smoking status: Former Smoker    Packs/day: 0.10    Years: 35.00    Pack years: 3.50    Types: Cigarettes    Last attempt to quit: 01/07/2010    Years since quitting: 8.6  . Smokeless tobacco: Never Used  Substance Use Topics  . Alcohol use: No  . Drug use: No    Review  of Systems  Constitutional: Negative for fever. Eyes: Negative for visual changes. ENT: Negative for sore throat. Cardiovascular: Negative for chest pain. Respiratory: Negative for shortness of breath. Gastrointestinal: Negative for abdominal pain, vomiting and diarrhea. Genitourinary: Negative for dysuria. Musculoskeletal: Negative for back pain.  Ports right shoulder and left ankle pain as above. Skin: Negative for rash. Neurological: Negative for headaches, focal weakness or numbness. ____________________________________________  PHYSICAL EXAM:  VITAL SIGNS: ED Triage Vitals  Enc Vitals Group     BP 09/07/18 1704 (!) 151/80     Pulse Rate 09/07/18 1704 94     Resp 09/07/18 1704 19     Temp 09/07/18 1704 98.4 F (36.9 C)     Temp Source 09/07/18 1704 Oral     SpO2 09/07/18 1704 100 %     Weight 09/07/18 1654 (!) 315 lb 4.1 oz (143 kg)     Height --      Head Circumference --      Peak Flow --      Pain Score 09/07/18 1654 7     Pain Loc --      Pain Edu? --      Excl. in GC? --     Constitutional: Alert and oriented. Well appearing and in no distress. Head: Normocephalic and atraumatic. Eyes: Conjunctivae are normal. Normal extraocular movements Neck: Supple. Normal ROM. No midline tenderness Cardiovascular: Normal rate, regular rhythm. Normal distal pulses. Respiratory: Normal respiratory effort. No wheezes/rales/rhonchi. Musculoskeletal: Right shoulder without obvious deformity or dislocation.  Patient with normal active range of the shoulder on exam.  Is mildly tender to palpation over the right upper trapezius musculature.  The right ankle is with some lateral soft tissue swelling.  Nontender with normal range of motion in all other extremities.  Neurologic:  Normal gait without ataxia. Normal speech and language. No gross focal neurologic deficits are appreciated. Skin:  Skin is warm, dry and intact. No rash noted. Psychiatric: Mood and affect are normal. Patient  exhibits appropriate insight and judgment. ____________________________________________   RADIOLOGY  Right Shoulder IMPRESSION: No fracture or dislocation. Mild AC joint degenerative changes.  Left Ankle IMPRESSION: No fracture or dislocation. ____________________________________________  PROCEDURES  Procedures Toradol 30 gm IM Flexeril 10 mg PO Post Op Shoe ____________________________________________  INITIAL IMPRESSION / ASSESSMENT AND PLAN / ED COURSE  Patient with ED evaluation of injury sustained following mechanical fall yesterday.  Patient's exam is consistent with a left ankle sprain and a right shoulder strain.  No acute fracture on her x-rays at this time.  Patient will be discharged with prescriptions for ketorolac and Flexeril dose as directed.  She is also placed in a postop shoe for comfort.  She will follow-up with primary provider for ongoing symptoms.  Work note is provided for 1 day as requested. ____________________________________________  FINAL CLINICAL IMPRESSION(S) / ED DIAGNOSES  Final diagnoses:  Sprain of left ankle, unspecified ligament, initial encounter  Shoulder strain, right, initial encounter      Lissa Hoard, PA-C 09/07/18  1610    Jeanmarie Plant, MD 09/07/18 2003

## 2018-09-07 NOTE — ED Triage Notes (Signed)
Pt reports twisting left ankle

## 2018-09-07 NOTE — Discharge Instructions (Addendum)
Your exam and x-rays are reassuring, as there is no evidence of acute fracture or dislocation. Take the prescription meds as directed. Apply ice to reduce pain and swelling. Follow-up with your provider for ongoing symptoms.

## 2019-01-29 DIAGNOSIS — M7540 Impingement syndrome of unspecified shoulder: Secondary | ICD-10-CM | POA: Insufficient documentation

## 2019-05-21 DIAGNOSIS — M75120 Complete rotator cuff tear or rupture of unspecified shoulder, not specified as traumatic: Secondary | ICD-10-CM | POA: Insufficient documentation

## 2019-08-18 ENCOUNTER — Other Ambulatory Visit: Payer: Self-pay | Admitting: Internal Medicine

## 2019-08-18 DIAGNOSIS — Z1231 Encounter for screening mammogram for malignant neoplasm of breast: Secondary | ICD-10-CM

## 2019-09-24 ENCOUNTER — Ambulatory Visit
Admission: RE | Admit: 2019-09-24 | Discharge: 2019-09-24 | Disposition: A | Discharge status: Home / Self Care | Payer: BC Managed Care – PPO | Source: Ambulatory Visit | Attending: Internal Medicine | Admitting: Internal Medicine

## 2019-09-24 DIAGNOSIS — Z1231 Encounter for screening mammogram for malignant neoplasm of breast: Secondary | ICD-10-CM | POA: Diagnosis not present

## 2020-01-18 ENCOUNTER — Other Ambulatory Visit: Payer: Self-pay

## 2020-01-18 DIAGNOSIS — R200 Anesthesia of skin: Secondary | ICD-10-CM

## 2020-01-19 ENCOUNTER — Ambulatory Visit (INDEPENDENT_AMBULATORY_CARE_PROVIDER_SITE_OTHER): Payer: BC Managed Care – PPO | Admitting: Neurology

## 2020-01-19 ENCOUNTER — Other Ambulatory Visit: Payer: Self-pay

## 2020-01-19 DIAGNOSIS — G5603 Carpal tunnel syndrome, bilateral upper limbs: Secondary | ICD-10-CM

## 2020-01-19 DIAGNOSIS — R200 Anesthesia of skin: Secondary | ICD-10-CM | POA: Diagnosis not present

## 2020-01-19 NOTE — Procedures (Signed)
[facility]  [place] [place], [place]  [place], [place] [address] Tel: [phone] Fax:  [phone] Test Date:  01/19/2020  Patient: Monique Patton DOB: Feb 21, 1965 Physician: Narda Amber, DO  Sex: Female Height: 5\' 5"  Ref Phys: Berle Mull, MD  ID#: [id number] Temp: 34.0C Technician:    Patient Complaints: This is a 55 year old female referred for evaluation of bilateral hand numbness and tingling, worse on the left.  NCV & EMG Findings: Extensive electrodiagnostic testing of the left upper extremity and additional studies of the right shows:  1. Bilateral median sensory responses show prolonged latency (L5.6, R4.4 ms) and reduced amplitude (L7.4, R11.2 V).  Bilateral ulnar sensory responses are within normal limits. 2. Bilateral median motor responses show prolonged latency (L5.6, R4.1 ms).  Bilateral ulnar motor responses are within normal limits.   3. Chronic motor axonal loss changes are seen affecting bilateral abductor pollicis brevis muscles, without accompanied active denervation.    Impression: Bilateral median neuropathy at or distal to the wrist, consistent with a clinical diagnosis of carpal tunnel syndrome.  Overall, these findings are moderate in degree electrically and worse in the left.   ___________________________ Narda Amber, DO    Nerve Conduction Studies Anti Sensory Summary Table   Site NR Peak (ms) Norm Peak (ms) P-T Amp (V) Norm P-T Amp  Left Median Anti Sensory (2nd Digit)  34C  Wrist    5.6 <3.6 7.4 >15  Right Median Anti Sensory (2nd Digit)  34C  Wrist    4.4 <3.6 11.2 >15  Left Ulnar Anti Sensory (5th Digit)  34C  Wrist    2.9 <3.1 29.1 >10  Right Ulnar Anti Sensory (5th Digit)  34C  Wrist    2.6 <3.1 28.6 >10   Motor Summary Table   Site NR Onset (ms) Norm Onset (ms) O-P Amp (mV) Norm O-P Amp Site1 Site2 Delta-0 (ms) Dist (cm) Vel (m/s) Norm Vel (m/s)  Left Median Motor (Abd Poll Brev)  34C  Wrist    5.6 <4.0 8.1 >6  Elbow Wrist 5.8 30.0 52 >50  Elbow    11.4  7.8         Right Median Motor (Abd Poll Brev)  34C  Wrist    4.1 <4.0 9.0 >6 Elbow Wrist 6.2 32.0 52 >50  Elbow    10.3  8.2         Left Ulnar Motor (Abd Dig Minimi)  34C  Wrist    2.5 <3.1 10.4 >7 B Elbow Wrist 4.3 25.0 58 >50  B Elbow    6.8  9.6  A Elbow B Elbow 1.5 10.0 67 >50  A Elbow    8.3  9.1         Right Ulnar Motor (Abd Dig Minimi)  34C  Wrist    2.1 <3.1 10.2 >7 B Elbow Wrist 4.7 26.0 55 >50  B Elbow    6.8  9.0  A Elbow B Elbow 1.5 10.0 67 >50  A Elbow    8.3  9.0          EMG   Side Muscle Ins Act Fibs Psw Fasc Number Recrt Dur Dur. Amp Amp. Poly Poly. Comment  Right 1stDorInt Nml Nml Nml Nml Nml Nml Nml Nml Nml Nml Nml Nml N/A  Right Abd Poll Brev Nml Nml Nml Nml 1- Rapid Few 1+ Few 1+ Few 1+ N/A  Right PronatorTeres Nml Nml Nml Nml Nml Nml Nml Nml Nml Nml Nml Nml N/A  Right Biceps Nml Nml Nml Nml Nml Nml Nml Nml Nml Nml Nml Nml N/A  Right Triceps Nml Nml Nml Nml Nml Nml Nml Nml Nml Nml Nml Nml N/A  Right Deltoid Nml Nml Nml Nml Nml Nml Nml Nml Nml Nml Nml Nml N/A  Left 1stDorInt Nml Nml Nml Nml Nml Nml Nml Nml Nml Nml Nml Nml N/A  Left Abd Poll Brev Nml Nml Nml Nml 1- Rapid Some 1+ Some 1+ Nml Nml N/A  Left PronatorTeres Nml Nml Nml Nml Nml Nml Nml Nml Nml Nml Nml Nml N/A  Left Biceps Nml Nml Nml Nml Nml Nml Nml Nml Nml Nml Nml Nml N/A  Left Triceps Nml Nml Nml Nml Nml Nml Nml Nml Nml Nml Nml Nml N/A      Waveforms:

## 2020-01-20 ENCOUNTER — Ambulatory Visit: Payer: BC Managed Care – PPO | Attending: Internal Medicine

## 2020-01-20 DIAGNOSIS — Z20822 Contact with and (suspected) exposure to covid-19: Secondary | ICD-10-CM

## 2020-01-21 LAB — NOVEL CORONAVIRUS, NAA: SARS-CoV-2, NAA: NOT DETECTED

## 2020-02-28 ENCOUNTER — Ambulatory Visit: Payer: BC Managed Care – PPO | Attending: Internal Medicine

## 2020-02-28 DIAGNOSIS — Z23 Encounter for immunization: Secondary | ICD-10-CM | POA: Insufficient documentation

## 2020-02-28 NOTE — Progress Notes (Signed)
   Covid-19 Vaccination Clinic  Name:  Monique Patton    MRN: [id number] DOB: 1965/05/03  02/28/2020  [name] was observed post Covid-19 immunization for 15 minutes without incidence. She was provided with Vaccine Information Sheet and instruction to access the V-Safe system.   [name] was instructed to call 911 with any severe reactions post vaccine: Marland Kitchen Difficulty breathing  . Swelling of your face and throat  . A fast heartbeat  . A bad rash all over your body  . Dizziness and weakness    Immunizations Administered    Name Date Dose VIS Date Route   Pfizer COVID-19 Vaccine 02/28/2020  3:52 PM 0.3 mL 12/11/2019 Intramuscular   Manufacturer: [employer], [employer]   Lot: [id number]   NDC: [address]-3

## 2020-03-22 ENCOUNTER — Ambulatory Visit: Payer: BC Managed Care – PPO | Attending: Internal Medicine

## 2020-03-22 DIAGNOSIS — Z23 Encounter for immunization: Secondary | ICD-10-CM

## 2020-03-22 NOTE — Progress Notes (Signed)
   Covid-19 Vaccination Clinic  Name:  Monique Patton    MRN: [id number] DOB: 01/03/1965  03/22/2020  Monique Patton was observed post Covid-19 immunization for 15 minutes without incident. She was provided with Vaccine Information Sheet and instruction to access the V-Safe system.   Monique Patton was instructed to call 911 with any severe reactions post vaccine: Marland Kitchen Difficulty breathing  . Swelling of face and throat  . A fast heartbeat  . A bad rash all over body  . Dizziness and weakness   Immunizations Administered    Name Date Dose VIS Date Route   Pfizer COVID-19 Vaccine 03/22/2020  1:15 PM 0.3 mL 12/11/2019 Intramuscular   Manufacturer: [employer], [employer]   Lot: [id number]   NDC: [address]-2

## 2020-05-23 ENCOUNTER — Encounter: Payer: BC Managed Care – PPO | Admitting: Dietician

## 2020-05-23 ENCOUNTER — Other Ambulatory Visit: Payer: Self-pay

## 2020-06-23 ENCOUNTER — Ambulatory Visit: Payer: BC Managed Care – PPO | Admitting: Dietician

## 2020-06-29 ENCOUNTER — Encounter: Payer: Self-pay | Admitting: Dietician

## 2020-06-29 NOTE — Progress Notes (Signed)
Patient missed her second consecutively scheduled appointment on 06/23/20, and has not called to reschedule. Sent notification to referring provider.

## 2020-07-07 ENCOUNTER — Other Ambulatory Visit: Payer: Self-pay | Admitting: Neurosurgery

## 2020-07-07 DIAGNOSIS — M47816 Spondylosis without myelopathy or radiculopathy, lumbar region: Secondary | ICD-10-CM

## 2020-07-23 ENCOUNTER — Other Ambulatory Visit: Payer: Self-pay

## 2020-07-23 ENCOUNTER — Ambulatory Visit
Admission: RE | Admit: 2020-07-23 | Discharge: 2020-07-23 | Disposition: A | Discharge status: Home / Self Care | Payer: BC Managed Care – PPO | Source: Ambulatory Visit | Attending: Neurosurgery | Admitting: Neurosurgery

## 2020-07-23 DIAGNOSIS — M47816 Spondylosis without myelopathy or radiculopathy, lumbar region: Secondary | ICD-10-CM | POA: Diagnosis not present

## 2020-07-23 MED ORDER — GADOBUTROL 1 MMOL/ML IV SOLN
10.0000 mL | Freq: Once | INTRAVENOUS | Status: AC | PRN
  Administered 2020-07-23: 10 mL via INTRAVENOUS

## 2020-08-02 DIAGNOSIS — Z6841 Body Mass Index (BMI) 40.0 and over, adult: Secondary | ICD-10-CM | POA: Insufficient documentation

## 2021-02-17 ENCOUNTER — Other Ambulatory Visit (HOSPITAL_COMMUNITY)
Admission: RE | Admit: 2021-02-17 | Discharge: 2021-02-17 | Disposition: A | Discharge status: Home / Self Care | Payer: BC Managed Care – PPO | Source: Ambulatory Visit | Attending: Obstetrics and Gynecology | Admitting: Obstetrics and Gynecology

## 2021-02-17 ENCOUNTER — Encounter: Payer: Self-pay | Admitting: Obstetrics and Gynecology

## 2021-02-17 ENCOUNTER — Other Ambulatory Visit: Payer: Self-pay

## 2021-02-17 ENCOUNTER — Ambulatory Visit (INDEPENDENT_AMBULATORY_CARE_PROVIDER_SITE_OTHER): Payer: BC Managed Care – PPO | Admitting: Obstetrics and Gynecology

## 2021-02-17 VITALS — BP 120/70 | Ht 67.0 in | Wt 313.0 lb

## 2021-02-17 DIAGNOSIS — N950 Postmenopausal bleeding: Secondary | ICD-10-CM | POA: Insufficient documentation

## 2021-02-17 DIAGNOSIS — R300 Dysuria: Secondary | ICD-10-CM

## 2021-02-17 DIAGNOSIS — R875 Abnormal microbiological findings in specimens from female genital organs: Secondary | ICD-10-CM

## 2021-02-17 LAB — POCT URINALYSIS DIPSTICK OB
Bilirubin, UA: NEGATIVE
Blood, UA: NEGATIVE
Glucose, UA: NEGATIVE
Ketones, UA: NEGATIVE
Leukocytes, UA: NEGATIVE
Nitrite, UA: NEGATIVE
POC,PROTEIN,UA: NEGATIVE
Spec Grav, UA: 1.025 (ref 1.010–1.025)
Urobilinogen, UA: 0.2 E.U./dL
pH, UA: 7 (ref 5.0–8.0)

## 2021-02-17 NOTE — Progress Notes (Signed)
Patient ID: Monique Patton, female   DOB: 03-15-1965, 56 y.o.   MRN: [id number]  Reason for Consult: Gynecologic Exam   Referred by Fayrene Helper, NP  Subjective:     HPI:  Monique Patton is a 56 y.o. female. She presents today with complaints of vaginal spotting and bleeding. This started in October. She has noticed it about 2 days a month. She noticed the blood twice after intercourse. She has not had a prior evaluation for this bleeding. The bleeding is small and noticed it when she wipes after urination. She had a hysterectomy more than 10 years ago for endometriosis. She still has her cervix. She believes that she still has her ovaries.   Gynecological History Menarche: 89 Menopause: 23 Last pap smear: unsure Last Mammogram: 2019 per patient History of STDs: denies Sexually Active: yes  Obstetrical History OB History  Gravida Para Term Preterm AB Living  1 1 1     1   SAB IAB Ectopic Multiple Live Births               # Outcome Date GA Lbr Len/2nd Weight Sex Delivery Anes PTL Lv  1 Term 08/25/85    M         Past Medical History:  Diagnosis Date  . Allergy   . Arthritis   . Eczema   . H/O gastric bypass   . Heart murmur    since birth  . Persistent headaches   . Vitamin D deficiency    Family History  Problem Relation Age of Onset  . Hyperlipidemia Father   . Hypertension Father   . Diabetes Father   . Breast cancer Neg Hx    Past Surgical History:  Procedure Laterality Date  . ABDOMINAL HYSTERECTOMY    . ADENIDECTOMY    . ANTERIOR CERVICAL DECOMP/DISCECTOMY FUSION  03/13/2012   Procedure: ANTERIOR CERVICAL DECOMPRESSION/DISCECTOMY FUSION 2 LEVELS;  Surgeon: 03/15/2012, MD;  Location: [facility];  Service: Neurosurgery;  Laterality: N/A;  Cervical Five-Six Cervical Six-Seven Anterior Cervical Decompression with fusion plating and bonegraft  . COLONOSCOPY WITH PROPOFOL N/A 01/07/2018   Procedure: COLONOSCOPY WITH PROPOFOL;  Surgeon: [place],  03/07/2018, MD;  Location: [facility];  Service: Gastroenterology;  Laterality: N/A;  . GASTRIC BYPASS    . LUMBAR LAMINECTOMY/DECOMPRESSION MICRODISCECTOMY Left 05/14/2013   Procedure: LUMBAR LAMINECTOMY/DECOMPRESSION MICRODISCECTOMY 1 LEVEL;  Surgeon: 05/16/2013, MD;  Location: [facility];  Service: Neurosurgery;  Laterality: Left;  LEFT Lumbar four-five4 extraforaminal microdiskectomy  . POSTERIOR CERVICAL FUSION/FORAMINOTOMY N/A 11/05/2013   Procedure: Cervical five-six posterior arthrodesis and instrumentation;  Surgeon: 13/05/2013, MD;  Location: [facility];  Service: Neurosurgery;  Laterality: N/A;  Cervical five-six posterior arthrodesis and instrumentation  . TONSILLECTOMY      Short Social History:  Social History   Tobacco Use  . Smoking status: Former Smoker    Packs/day: 0.10    Years: 35.00    Pack years: 3.50    Types: Cigarettes    Quit date: 01/07/2010    Years since quitting: 11.1  . Smokeless tobacco: Never Used  Substance Use Topics  . Alcohol use: No    Allergies  Allergen Reactions  . Other Itching and Rash    Cat Dander  . Fish Allergy Rash  . Penicillins Itching and Rash  . Shellfish Allergy Rash    Current Outpatient Medications  Medication Sig Dispense Refill  . ACETAMINOPHEN-BUTALBITAL 50-650 MG TABS Take 1  tablet by mouth every 6 (six) hours as needed (headache).    . cyclobenzaprine (FLEXERIL) 5 MG tablet Take 1 tablet (5 mg total) by mouth 3 (three) times daily as needed for muscle spasms. 15 tablet 0  . diclofenac sodium (VOLTAREN) 1 % GEL Apply 2 g topically 4 (four) times daily. 1 Tube 5  . ketorolac (TORADOL) 10 MG tablet Take 1 tablet (10 mg total) by mouth every 8 (eight) hours. 15 tablet 0  . celecoxib (CELEBREX) 200 MG capsule Take 200 mg by mouth daily. (Patient not taking: Reported on 02/17/2021)     No current facility-administered medications for this visit.    Review of Systems  Constitutional: Negative for  chills, fatigue, fever and unexpected weight change.  HENT: Negative for trouble swallowing.  Eyes: Negative for loss of vision.  Respiratory: Negative for cough, shortness of breath and wheezing.  Cardiovascular: Negative for chest pain, leg swelling, palpitations and syncope.  GI: Negative for abdominal pain, blood in stool, diarrhea, nausea and vomiting.  GU: Negative for difficulty urinating, dysuria, frequency and hematuria.  Musculoskeletal: Negative for back pain, leg pain and joint pain.  Skin: Negative for rash.  Neurological: Negative for dizziness, headaches, light-headedness, numbness and seizures.  Psychiatric: Negative for behavioral problem, confusion, depressed mood and sleep disturbance.        Objective:  Objective   Vitals:   02/17/21 1434  BP: 120/70  Weight: (!) 313 lb (142 kg)  Height: 5\' 7"  (1.702 m)   Body mass index is 49.02 kg/m.  Physical Exam Vitals and nursing note reviewed.  Constitutional:      Appearance: She is well-developed and well-nourished.  HENT:     Head: Normocephalic and atraumatic.  Eyes:     Extraocular Movements: EOM normal.     Pupils: Pupils are equal, round, and reactive to light.  Cardiovascular:     Rate and Rhythm: Normal rate and regular rhythm.  Pulmonary:     Effort: Pulmonary effort is normal. No respiratory distress.  Genitourinary:    Comments: External: Normal appearing vulva. No lesions noted.  Speculum examination: Normal appearing cervix. No blood in the vaginal vault. no discharge.   Bimanual examination: Uterus absent.  No CMT. No adnexal masses. No adnexal tenderness. Pelvis not fixed.  Skin:    General: Skin is warm and dry.  Neurological:     Mental Status: She is alert and oriented to person, place, and time.  Psychiatric:        Mood and Affect: Mood and affect normal.        Behavior: Behavior normal.        Thought Content: Thought content normal.        Judgment: Judgment normal.      Assessment/Plan:     56 yo with postmenopausal vaginal bleeding.  Will check for infection and update pap smear. Given normal exam findings and normal UA bleeding likey atrophic. Provided with information regarding vaginal moisturizers.   More than 20 minutes were spent face to face with the patient in the room, reviewing the medical record, labs and images, and coordinating care for the patient. The plan of management was discussed in detail and counseling was provided.   58 MD, Adelene Idler OB/[facility], [facility] 02/20/2021 11:54 AM

## 2021-02-17 NOTE — Patient Instructions (Signed)
Vaginal/ Vulvar Moisturizer Use 3-5 times a week at bedtime  Hyalo Gyn Revaree Replens Carlson Key-E suppositories Vitamin E oil, olive oil, coconut oil   Water-based Lubricants  Astroglide KY Jelly  Luvena Aquagel  Silicone- based Lubricants Pjur PINK Astroglide silicone Uberlube    

## 2021-02-22 LAB — NUSWAB BV AND CANDIDA, NAA
Candida albicans, NAA: NEGATIVE
Candida glabrata, NAA: NEGATIVE

## 2021-02-25 LAB — URINALYSIS, MICROSCOPIC ONLY

## 2021-02-27 LAB — CYTOLOGY - PAP
Chlamydia: NEGATIVE
Comment: NEGATIVE
Comment: NEGATIVE
Comment: NEGATIVE
Comment: NORMAL
Diagnosis: NEGATIVE
High risk HPV: NEGATIVE
Neisseria Gonorrhea: NEGATIVE
Trichomonas: NEGATIVE

## 2021-03-09 DIAGNOSIS — M1990 Unspecified osteoarthritis, unspecified site: Secondary | ICD-10-CM | POA: Insufficient documentation

## 2021-03-10 ENCOUNTER — Ambulatory Visit: Payer: BC Managed Care – PPO | Admitting: Podiatry

## 2021-03-10 ENCOUNTER — Encounter: Payer: Self-pay | Admitting: Podiatry

## 2021-03-10 ENCOUNTER — Other Ambulatory Visit: Payer: Self-pay

## 2021-03-10 DIAGNOSIS — L603 Nail dystrophy: Secondary | ICD-10-CM

## 2021-03-10 DIAGNOSIS — M79675 Pain in left toe(s): Secondary | ICD-10-CM

## 2021-03-10 NOTE — Progress Notes (Signed)
   HPI: 56 y.o. female presenting today for evaluation of painful symptomatic toenail to the left second toe this been going on for approximately 1-1/2 years.  Patient states that 1/2 years ago she stubbed her toe and it became very painful.  The nail began to grow sideways and crooked and thickened.  It is very symptomatic in shoe gear.  She has not done anything for treatment.  She presents for further treatment evaluation  Past Medical History:  Diagnosis Date  . Allergy   . Arthritis   . Eczema   . H/O gastric bypass   . Heart murmur    since birth  . Persistent headaches   . Vitamin D deficiency      Physical Exam: General: The patient is alert and oriented x3 in no acute distress.  Dermatology: Skin is warm, dry and supple bilateral lower extremities. Negative for open lesions or macerations.  Hyperkeratotic thickened dystrophic nail noted to the left second toenail with associated tenderness to palpation  Vascular: Palpable pedal pulses bilaterally. No edema or erythema noted. Capillary refill within normal limits.  Neurological: Epicritic and protective threshold grossly intact bilaterally.   Musculoskeletal Exam: No pedal deformities noted.  Muscle strength 5/5.  Associated tenderness to palpation to the nail plate of the left second toe  Assessment: 1.  Painful dystrophic nail left second toe secondary to trauma   Plan of Care:  1. Patient evaluated.  2.  Today we discussed different treatment options including simple debridement, total temporary nail avulsion, or permanent nail avulsion to alleviate the patient's symptoms.  After discussing each 1 in detail the patient opts for total temporary nail avulsion to allow a new nail to grow in potentially better than the current one.  I explained there is no guarantee as to how the new nail will grow in.  Patient understands. 3.  The toe was blocked in a digital block manner using 3 mL of 2% lidocaine plain.  The toe was prepped  in aseptic manner 4.  Total temporary nail avulsion was performed and the nail plate was removed in its entirety.  Light dressing applied.  Post care instructions provided. 5.  Return to clinic as needed      Felecia Shelling, DPM [facility]  Dr. Felecia Shelling, DPM    [address] [place], [place] [address]                Office [phone]  Fax [phone]

## 2021-06-14 ENCOUNTER — Other Ambulatory Visit: Payer: Self-pay | Admitting: Nurse Practitioner

## 2021-06-14 DIAGNOSIS — Z1231 Encounter for screening mammogram for malignant neoplasm of breast: Secondary | ICD-10-CM

## 2021-06-27 ENCOUNTER — Ambulatory Visit
Admission: RE | Admit: 2021-06-27 | Discharge: 2021-06-27 | Disposition: A | Discharge status: Home / Self Care | Payer: BC Managed Care – PPO | Source: Ambulatory Visit | Attending: Nurse Practitioner | Admitting: Nurse Practitioner

## 2021-06-27 ENCOUNTER — Other Ambulatory Visit: Payer: Self-pay

## 2021-06-27 DIAGNOSIS — Z1231 Encounter for screening mammogram for malignant neoplasm of breast: Secondary | ICD-10-CM | POA: Insufficient documentation

## 2021-10-18 ENCOUNTER — Other Ambulatory Visit: Payer: Self-pay | Admitting: Orthopedic Surgery

## 2021-10-30 ENCOUNTER — Encounter
Admission: RE | Admit: 2021-10-30 | Discharge: 2021-10-30 | Disposition: A | Discharge status: Home / Self Care | Payer: BC Managed Care – PPO | Source: Ambulatory Visit | Attending: Orthopedic Surgery | Admitting: Orthopedic Surgery

## 2021-10-30 ENCOUNTER — Other Ambulatory Visit: Payer: Self-pay

## 2021-10-30 VITALS — BP 110/67 | HR 81 | Temp 97.9°F | Resp 12 | Ht 66.0 in | Wt 281.0 lb

## 2021-10-30 DIAGNOSIS — Z01810 Encounter for preprocedural cardiovascular examination: Secondary | ICD-10-CM | POA: Diagnosis not present

## 2021-10-30 DIAGNOSIS — Z01818 Encounter for other preprocedural examination: Secondary | ICD-10-CM | POA: Diagnosis present

## 2021-10-30 DIAGNOSIS — Z01812 Encounter for preprocedural laboratory examination: Secondary | ICD-10-CM

## 2021-10-30 HISTORY — DX: Prediabetes: R73.03

## 2021-10-30 LAB — CBC WITH DIFFERENTIAL/PLATELET
Abs Immature Granulocytes: 0.01 10*3/uL (ref 0.00–0.07)
Basophils Absolute: 0 10*3/uL (ref 0.0–0.1)
Basophils Relative: 1 %
Eosinophils Absolute: 0.2 10*3/uL (ref 0.0–0.5)
Eosinophils Relative: 3 %
HCT: 36 % (ref 36.0–46.0)
Hemoglobin: 11.5 g/dL — ABNORMAL LOW (ref 12.0–15.0)
Immature Granulocytes: 0 %
Lymphocytes Relative: 38 %
Lymphs Abs: 2.3 10*3/uL (ref 0.7–4.0)
MCH: 27.4 pg (ref 26.0–34.0)
MCHC: 31.9 g/dL (ref 30.0–36.0)
MCV: 85.7 fL (ref 80.0–100.0)
Monocytes Absolute: 0.6 10*3/uL (ref 0.1–1.0)
Monocytes Relative: 9 %
Neutro Abs: 3 10*3/uL (ref 1.7–7.7)
Neutrophils Relative %: 49 %
Platelets: 258 10*3/uL (ref 150–400)
RBC: 4.2 MIL/uL (ref 3.87–5.11)
RDW: 14.6 % (ref 11.5–15.5)
WBC: 6.1 10*3/uL (ref 4.0–10.5)
nRBC: 0 % (ref 0.0–0.2)

## 2021-10-30 LAB — COMPREHENSIVE METABOLIC PANEL
ALT: 13 U/L (ref 0–44)
AST: 16 U/L (ref 15–41)
Albumin: 3.7 g/dL (ref 3.5–5.0)
Alkaline Phosphatase: 73 U/L (ref 38–126)
Anion gap: 7 (ref 5–15)
BUN: 19 mg/dL (ref 6–20)
CO2: 26 mmol/L (ref 22–32)
Calcium: 9.2 mg/dL (ref 8.9–10.3)
Chloride: 108 mmol/L (ref 98–111)
Creatinine, Ser: 0.98 mg/dL (ref 0.44–1.00)
GFR, Estimated: >60 mL/min (ref >60)
Glucose, Bld: 81 mg/dL (ref 70–99)
Potassium: 4.2 mmol/L (ref 3.5–5.1)
Sodium: 141 mmol/L (ref 135–145)
Total Bilirubin: 0.7 mg/dL (ref 0.3–1.2)
Total Protein: 6.6 g/dL (ref 6.5–8.1)

## 2021-10-30 LAB — URINALYSIS, ROUTINE W REFLEX MICROSCOPIC
Bilirubin Urine: NEGATIVE
Glucose, UA: NEGATIVE mg/dL
Hgb urine dipstick: NEGATIVE
Ketones, ur: NEGATIVE mg/dL
Nitrite: NEGATIVE
Protein, ur: NEGATIVE mg/dL
Specific Gravity, Urine: 1.019 (ref 1.005–1.030)
pH: 6 (ref 5.0–8.0)

## 2021-10-30 LAB — SURGICAL PCR SCREEN
MRSA, PCR: NEGATIVE
Staphylococcus aureus: NEGATIVE

## 2021-10-30 LAB — TYPE AND SCREEN
ABO/RH(D): O POS
Antibody Screen: NEGATIVE

## 2021-10-30 NOTE — Patient Instructions (Addendum)
Your procedure is scheduled on: Thursday, November 10 Report to the Registration Desk on the 1st floor of the [facility] To find out your arrival time, please call [phone] between 1PM - 3PM on: Wednesday, November 9  REMEMBER: Instructions that are not followed completely may result in serious medical risk, up to and including death; or upon the discretion of your surgeon and anesthesiologist your surgery may need to be rescheduled.  Do not eat food after midnight the night before surgery.  No gum chewing, lozengers or hard candies.  You may however, drink CLEAR liquids up to 2 hours before you are scheduled to arrive for your surgery. Do not drink anything within 2 hours of your scheduled arrival time.  Clear liquids include: - water  - apple juice without pulp - gatorade (not RED, PURPLE, OR BLUE) - black coffee or tea (Do NOT add milk or creamers to the coffee or tea) Do NOT drink anything that is not on this list.  In addition, your doctor has ordered for you to drink the provided  Ensure Pre-Surgery Clear Carbohydrate Drink  Drinking this carbohydrate drink up to two hours before surgery helps to reduce insulin resistance and improve patient outcomes. Please complete drinking 2 hours prior to scheduled arrival time.  TAKE THESE MEDICATIONS THE MORNING OF SURGERY WITH A SIP OF WATER:  Topiramate (Topamax) Tramadol if needed for pain  One week prior to surgery: starting November 3 Stop Anti-inflammatories (NSAIDS) such as Advil, Aleve, Ibuprofen, Motrin, Naproxen, Naprosyn and Aspirin based products such as Excedrin, Goodys Powder, BC Powder. Stop ANY OVER THE COUNTER supplements until after surgery. Stop ibuprofen-famotidine. You may however, continue to take Tylenol if needed for pain up until the day of surgery.  No Alcohol for 24 hours before or after surgery.  No Smoking including e-cigarettes for 24 hours prior to surgery.  No chewable tobacco products for at  least 6 hours prior to surgery.  No nicotine patches on the day of surgery.  Do not use any "recreational" drugs for at least a week prior to your surgery.  Please be advised that the combination of cocaine and anesthesia may have negative outcomes, up to and including death. If you test positive for cocaine, your surgery will be cancelled.  On the morning of surgery brush your teeth with toothpaste and water, you may rinse your mouth with mouthwash if you wish. Do not swallow any toothpaste or mouthwash.  Use CHG Soap as directed on instruction sheet.  Do not wear jewelry, make-up, hairpins, clips or nail polish.  Do not wear lotions, powders, or perfumes.   Do not shave body from the neck down 48 hours prior to surgery just in case you cut yourself which could leave a site for infection.  Also, freshly shaved skin may become irritated if using the CHG soap.  Contact lenses, hearing aids and dentures may not be worn into surgery.  Do not bring valuables to the hospital. [facility] is not responsible for any missing/lost belongings or valuables.   Notify your doctor if there is any change in your medical condition (cold, fever, infection).  Wear comfortable clothing (specific to your surgery type) to the hospital.  After surgery, you can help prevent lung complications by doing breathing exercises.  Take deep breaths and cough every 1-2 hours. Your doctor may order a device called an Incentive Spirometer to help you take deep breaths.  If you are being admitted to the hospital overnight, leave your  suitcase in the car. After surgery it may be brought to your room.  If you are being discharged the day of surgery, you will not be allowed to drive home. You will need a responsible adult (18 years or older) to drive you home and stay with you that night.   If you are taking public transportation, you will need to have a responsible adult (18 years or older) with you. Please confirm  with your physician that it is acceptable to use public transportation.   Please call the Pre-admissions Testing Dept. at [phone] if you have any questions about these instructions.  Surgery Visitation Policy:  Patients undergoing a surgery or procedure may have one family member or support person with them as long as that person is not COVID-19 positive or experiencing its symptoms.  That person may remain in the waiting area during the procedure and may rotate out with other people.  Inpatient Visitation:    Visiting hours are 7 a.m. to 8 p.m. Up to two visitors ages 16+ are allowed at one time in a patient room. The visitors may rotate out with other people during the day. Visitors must check out when they leave, or other visitors will not be allowed. One designated support person may remain overnight. The visitor must pass COVID-19 screenings, use hand sanitizer when entering and exiting the patient's room and wear a mask at all times, including in the patient's room. Patients must also wear a mask when staff or their visitor are in the room. Masking is required regardless of vaccination status.

## 2021-11-01 ENCOUNTER — Telehealth: Payer: Self-pay | Admitting: Urgent Care

## 2021-11-01 DIAGNOSIS — B9620 Unspecified Escherichia coli [E. coli] as the cause of diseases classified elsewhere: Secondary | ICD-10-CM

## 2021-11-01 DIAGNOSIS — Z01812 Encounter for preprocedural laboratory examination: Secondary | ICD-10-CM

## 2021-11-01 LAB — URINE CULTURE: Culture: >=100000 — AB

## 2021-11-01 MED ORDER — SULFAMETHOXAZOLE-TRIMETHOPRIM 800-160 MG PO TABS: 1.0000 | ORAL_TABLET | Freq: Two times a day (BID) | ORAL | 0 refills | Status: AC

## 2021-11-01 NOTE — Progress Notes (Signed)
  [facility] Perioperative Services: Pre-Admission/Anesthesia Testing  Abnormal Lab Notification and Treatment Plan of Care   Date: 11/01/21  Name: Monique Patton MRN:   [id number]  Re: Abnormal labs noted during PAT appointment   Notified:  Provider Name Provider Role Notification Mode  Kennedy Bucker, MD ORTHOPEDICS Routed and/or faxed via [facility]   Clinical Information and Notes:  ABNORMAL LAB VALUE(S): Lab Results  Component Value Date   COLORURINE YELLOW (A) 10/30/2021   APPEARANCEUR HAZY (A) 10/30/2021   LABSPEC 1.019 10/30/2021   PHURINE 6.0 10/30/2021   GLUCOSEU NEGATIVE 10/30/2021   HGBUR NEGATIVE 10/30/2021   BILIRUBINUR NEGATIVE 10/30/2021   KETONESUR NEGATIVE 10/30/2021   PROTEINUR NEGATIVE 10/30/2021   UROBILINOGEN 0.2 02/17/2021   NITRITE NEGATIVE 10/30/2021   LEUKOCYTESUR SMALL (A) 10/30/2021   EPIU 0-5 10/30/2021   WBCU 11-20 10/30/2021   RBCU 0-5 10/30/2021   BACTERIA FEW (A) 10/30/2021   CULT >=100,000 COLONIES/mL ESCHERICHIA COLI (A) 10/30/2021   Clinical Notes:  Patient is scheduled for an elective LEFT TOTAL KNEE ARTHROPLASTY on 11/09/2021.  UA performed in PAT consistent with/concerning for infection.  No leukocytosis noted on CBC; WBC 6100 Renal function: Estimated Creatinine Clearance: 86.6 mL/min (by C-G formula based on SCr of 0.98 mg/dL). Urine C&S added to assess for pathogenically significant growth.  Impression and Plan:  MAYDELL KNOEBEL with a UA that was (+) for infection. Reflex culture sent to grew out E.coli.. Contacted patient to discuss. Patient reporting that she is experiencing minor LUTS with no associated abdominal or back pain, fevers, nausea, or vomiting. Patient with surgery scheduled soon. In efforts to avoid delaying patient's procedure, I would like to proceed with empiric treatment for developing urinary tract infection.  Allergies and sensitivity report reviewed. Will treat with a 3 day course of SMZ-TMP  DS. Patient encouraged to complete the entire course of antibiotics even if she begins to feel better.   Meds ordered this encounter  Medications   sulfamethoxazole-trimethoprim (BACTRIM DS) 800-160 MG tablet    Sig: Take 1 tablet by mouth 2 (two) times daily for 3 days. Increase WATER intake while taking this medication.    Dispense:  6 tablet    Refill:  0   Patient encouraged to increase her fluid intake as much as possible. Discussed that water is always best to flush the urinary tract. She was advised to avoid caffeine containing fluids until her infections clears, as caffeine can cause her to experience painful bladder spasms.   May use Tylenol as needed for pain/fever.  Results and treatment plan of care forwarded to primary attending surgeon to make them aware.   This is a Personal assistant; no formal response is required.  Quentin Mulling, MSN, APRN, FNP-C, CEN [facility]  Peri-operative Services Nurse Practitioner Phone: [phone] Fax: [phone] 11/01/21 8:56 AM

## 2021-11-07 ENCOUNTER — Other Ambulatory Visit: Payer: Self-pay

## 2021-11-07 ENCOUNTER — Other Ambulatory Visit
Admission: RE | Admit: 2021-11-07 | Discharge: 2021-11-07 | Disposition: A | Discharge status: Home / Self Care | Payer: BC Managed Care – PPO | Source: Ambulatory Visit | Attending: Orthopedic Surgery | Admitting: Orthopedic Surgery

## 2021-11-07 DIAGNOSIS — Z20822 Contact with and (suspected) exposure to covid-19: Secondary | ICD-10-CM

## 2021-11-07 DIAGNOSIS — Z01812 Encounter for preprocedural laboratory examination: Secondary | ICD-10-CM | POA: Insufficient documentation

## 2021-11-08 LAB — SARS CORONAVIRUS 2 (TAT 6-24 HRS): SARS Coronavirus 2: NEGATIVE

## 2021-11-09 ENCOUNTER — Encounter: Payer: Self-pay | Admitting: Orthopedic Surgery

## 2021-11-09 ENCOUNTER — Other Ambulatory Visit: Payer: Self-pay

## 2021-11-09 ENCOUNTER — Inpatient Hospital Stay
Admission: AD | Admit: 2021-11-09 | Discharge: 2021-11-12 | DRG: 470 | Disposition: A | Discharge status: Home Health | Payer: BC Managed Care – PPO | Attending: Orthopedic Surgery | Admitting: Orthopedic Surgery

## 2021-11-09 ENCOUNTER — Encounter
Admission: AD | Disposition: A | Discharge status: Home Health | Payer: Self-pay | Source: Home / Self Care | Attending: Orthopedic Surgery

## 2021-11-09 ENCOUNTER — Observation Stay: Payer: BC Managed Care – PPO

## 2021-11-09 ENCOUNTER — Ambulatory Visit: Payer: BC Managed Care – PPO | Admitting: Urgent Care

## 2021-11-09 DIAGNOSIS — Z90710 Acquired absence of both cervix and uterus: Secondary | ICD-10-CM

## 2021-11-09 DIAGNOSIS — M1712 Unilateral primary osteoarthritis, left knee: Principal | ICD-10-CM | POA: Diagnosis present

## 2021-11-09 DIAGNOSIS — Z96652 Presence of left artificial knee joint: Secondary | ICD-10-CM

## 2021-11-09 DIAGNOSIS — Z87891 Personal history of nicotine dependence: Secondary | ICD-10-CM

## 2021-11-09 DIAGNOSIS — Z823 Family history of stroke: Secondary | ICD-10-CM

## 2021-11-09 DIAGNOSIS — Z79899 Other long term (current) drug therapy: Secondary | ICD-10-CM

## 2021-11-09 DIAGNOSIS — G8918 Other acute postprocedural pain: Secondary | ICD-10-CM

## 2021-11-09 DIAGNOSIS — E669 Obesity, unspecified: Secondary | ICD-10-CM | POA: Diagnosis present

## 2021-11-09 DIAGNOSIS — Z20822 Contact with and (suspected) exposure to covid-19: Secondary | ICD-10-CM | POA: Diagnosis present

## 2021-11-09 DIAGNOSIS — Z6841 Body Mass Index (BMI) 40.0 and over, adult: Secondary | ICD-10-CM

## 2021-11-09 DIAGNOSIS — Z8249 Family history of ischemic heart disease and other diseases of the circulatory system: Secondary | ICD-10-CM

## 2021-11-09 DIAGNOSIS — E119 Type 2 diabetes mellitus without complications: Secondary | ICD-10-CM | POA: Diagnosis present

## 2021-11-09 DIAGNOSIS — Z9884 Bariatric surgery status: Secondary | ICD-10-CM

## 2021-11-09 DIAGNOSIS — Z981 Arthrodesis status: Secondary | ICD-10-CM

## 2021-11-09 DIAGNOSIS — Z833 Family history of diabetes mellitus: Secondary | ICD-10-CM

## 2021-11-09 HISTORY — PX: TOTAL KNEE ARTHROPLASTY: SHX125

## 2021-11-09 LAB — CBC
HCT: 35 % — ABNORMAL LOW (ref 36.0–46.0)
Hemoglobin: 10.9 g/dL — ABNORMAL LOW (ref 12.0–15.0)
MCH: 26.8 pg (ref 26.0–34.0)
MCHC: 31.1 g/dL (ref 30.0–36.0)
MCV: 86 fL (ref 80.0–100.0)
Platelets: 199 10*3/uL (ref 150–400)
RBC: 4.07 MIL/uL (ref 3.87–5.11)
RDW: 14.3 % (ref 11.5–15.5)
WBC: 7.5 10*3/uL (ref 4.0–10.5)
nRBC: 0 % (ref 0.0–0.2)

## 2021-11-09 LAB — ABO/RH: ABO/RH(D): O POS

## 2021-11-09 LAB — CREATININE, SERUM
Creatinine, Ser: 0.87 mg/dL (ref 0.44–1.00)
GFR, Estimated: >60 mL/min (ref >60)

## 2021-11-09 SURGERY — ARTHROPLASTY, KNEE, TOTAL
Anesthesia: Spinal | Site: Knee | Laterality: Left

## 2021-11-09 MED ORDER — ALUM & MAG HYDROXIDE-SIMETH 200-200-20 MG/5ML PO SUSP: 30.0000 mL | ORAL | Status: DC | PRN

## 2021-11-09 MED ORDER — CETIRIZINE HCL 10 MG PO TABS
5.0000 mg | ORAL_TABLET | Freq: Every evening | ORAL | Status: DC
  Administered 2021-11-09 – 2021-11-11 (×3): 5 mg via ORAL
  Filled 2021-11-09 (×4): qty 1

## 2021-11-09 MED ORDER — PROPOFOL 1000 MG/100ML IV EMUL
INTRAVENOUS | Status: AC
  Filled 2021-11-09: qty 100

## 2021-11-09 MED ORDER — CYCLOBENZAPRINE HCL 10 MG PO TABS
10.0000 mg | ORAL_TABLET | Freq: Three times a day (TID) | ORAL | Status: DC | PRN
  Administered 2021-11-09: 10 mg via ORAL
  Filled 2021-11-09: qty 1

## 2021-11-09 MED ORDER — HYDROCODONE-ACETAMINOPHEN 7.5-325 MG PO TABS
1.0000 | ORAL_TABLET | ORAL | Status: DC | PRN
  Filled 2021-11-09 (×2): qty 2

## 2021-11-09 MED ORDER — ROSUVASTATIN CALCIUM 10 MG PO TABS
20.0000 mg | ORAL_TABLET | Freq: Every day | ORAL | Status: DC
  Administered 2021-11-09 – 2021-11-11 (×3): 20 mg via ORAL
  Filled 2021-11-09 (×3): qty 2

## 2021-11-09 MED ORDER — ACETAMINOPHEN 10 MG/ML IV SOLN
INTRAVENOUS | Status: DC | PRN
  Administered 2021-11-09: 1000 mg via INTRAVENOUS

## 2021-11-09 MED ORDER — PANTOPRAZOLE SODIUM 40 MG PO TBEC
40.0000 mg | DELAYED_RELEASE_TABLET | Freq: Every day | ORAL | Status: DC
  Administered 2021-11-09 – 2021-11-12 (×4): 40 mg via ORAL
  Filled 2021-11-09 (×4): qty 1

## 2021-11-09 MED ORDER — DOCUSATE SODIUM 100 MG PO CAPS
100.0000 mg | ORAL_CAPSULE | Freq: Two times a day (BID) | ORAL | Status: DC
  Administered 2021-11-09 – 2021-11-12 (×7): 100 mg via ORAL
  Filled 2021-11-09 (×7): qty 1

## 2021-11-09 MED ORDER — FENTANYL CITRATE (PF) 100 MCG/2ML IJ SOLN
INTRAMUSCULAR | Status: DC | PRN
  Administered 2021-11-09 (×2): 50 ug via INTRAVENOUS

## 2021-11-09 MED ORDER — PHENYLEPHRINE HCL (PRESSORS) 10 MG/ML IV SOLN
INTRAVENOUS | Status: AC
  Filled 2021-11-09: qty 1

## 2021-11-09 MED ORDER — MENTHOL 3 MG MT LOZG
1.0000 | LOZENGE | OROMUCOSAL | Status: DC | PRN
  Filled 2021-11-09: qty 9

## 2021-11-09 MED ORDER — SODIUM CHLORIDE 0.9 % IR SOLN
Status: DC | PRN
  Administered 2021-11-09: 3000 mL

## 2021-11-09 MED ORDER — DEXTROSE 5 % IV SOLN
3.0000 g | INTRAVENOUS | Status: AC
  Administered 2021-11-09: 3 g via INTRAVENOUS
  Filled 2021-11-09: qty 3

## 2021-11-09 MED ORDER — HYDROMORPHONE HCL 1 MG/ML IJ SOLN
1.0000 mg | Freq: Once | INTRAMUSCULAR | Status: AC
  Administered 2021-11-09: 1 mg via INTRAVENOUS
  Filled 2021-11-09: qty 1

## 2021-11-09 MED ORDER — ONDANSETRON HCL 4 MG/2ML IJ SOLN: 4.0000 mg | Freq: Four times a day (QID) | INTRAMUSCULAR | Status: DC | PRN

## 2021-11-09 MED ORDER — CHLORHEXIDINE GLUCONATE 0.12 % MT SOLN
OROMUCOSAL | Status: AC
  Administered 2021-11-09: 15 mL via OROMUCOSAL
  Filled 2021-11-09: qty 15

## 2021-11-09 MED ORDER — ENOXAPARIN SODIUM 30 MG/0.3ML IJ SOSY
30.0000 mg | PREFILLED_SYRINGE | Freq: Two times a day (BID) | INTRAMUSCULAR | Status: DC
  Administered 2021-11-10 – 2021-11-12 (×5): 30 mg via SUBCUTANEOUS
  Filled 2021-11-09 (×5): qty 0.3

## 2021-11-09 MED ORDER — MORPHINE SULFATE (PF) 2 MG/ML IV SOLN
0.5000 mg | INTRAVENOUS | Status: DC | PRN
  Administered 2021-11-09 – 2021-11-11 (×7): 1 mg via INTRAVENOUS
  Filled 2021-11-09 (×7): qty 1

## 2021-11-09 MED ORDER — CEFAZOLIN IN SODIUM CHLORIDE 3-0.9 GM/100ML-% IV SOLN
3.0000 g | INTRAVENOUS | Status: DC
  Filled 2021-11-09: qty 100

## 2021-11-09 MED ORDER — METOCLOPRAMIDE HCL 10 MG PO TABS: 5.0000 mg | ORAL_TABLET | Freq: Three times a day (TID) | ORAL | Status: DC | PRN

## 2021-11-09 MED ORDER — IBUPROFEN 400 MG PO TABS
800.0000 mg | ORAL_TABLET | Freq: Three times a day (TID) | ORAL | Status: DC
  Administered 2021-11-09 – 2021-11-12 (×9): 800 mg via ORAL
  Filled 2021-11-09 (×9): qty 2

## 2021-11-09 MED ORDER — LACTATED RINGERS IV SOLN
INTRAVENOUS | Status: DC
Start: 2021-11-09 — End: 2021-11-09

## 2021-11-09 MED ORDER — FAMOTIDINE 20 MG PO TABS: 20.0000 mg | ORAL_TABLET | Freq: Once | ORAL | Status: AC

## 2021-11-09 MED ORDER — PROPOFOL 500 MG/50ML IV EMUL
INTRAVENOUS | Status: DC | PRN
  Administered 2021-11-09: 50 ug/kg/min via INTRAVENOUS

## 2021-11-09 MED ORDER — FENTANYL CITRATE (PF) 100 MCG/2ML IJ SOLN: 25.0000 ug | INTRAMUSCULAR | Status: DC | PRN

## 2021-11-09 MED ORDER — ZOLPIDEM TARTRATE 5 MG PO TABS
5.0000 mg | ORAL_TABLET | Freq: Every evening | ORAL | Status: DC | PRN
  Administered 2021-11-09: 5 mg via ORAL
  Filled 2021-11-09: qty 1

## 2021-11-09 MED ORDER — POLYETHYLENE GLYCOL 3350 17 G PO PACK: 17.0000 g | PACK | Freq: Every day | ORAL | Status: DC | PRN

## 2021-11-09 MED ORDER — ONDANSETRON HCL 4 MG PO TABS: 4.0000 mg | ORAL_TABLET | Freq: Four times a day (QID) | ORAL | Status: DC | PRN

## 2021-11-09 MED ORDER — BUPIVACAINE HCL (PF) 0.5 % IJ SOLN
INTRAMUSCULAR | Status: DC | PRN
Start: 2021-11-09 — End: 2021-11-09
  Administered 2021-11-09: 2.8 mL

## 2021-11-09 MED ORDER — PRONTOSAN WOUND IRRIGATION OPTIME
TOPICAL | Status: DC | PRN
  Administered 2021-11-09: 1

## 2021-11-09 MED ORDER — FENTANYL CITRATE (PF) 100 MCG/2ML IJ SOLN
INTRAMUSCULAR | Status: AC
  Filled 2021-11-09: qty 2

## 2021-11-09 MED ORDER — SODIUM CHLORIDE (PF) 0.9 % IJ SOLN
INTRAMUSCULAR | Status: DC | PRN
  Administered 2021-11-09: 91 mL via INTRAMUSCULAR

## 2021-11-09 MED ORDER — MIDAZOLAM HCL 5 MG/5ML IJ SOLN
INTRAMUSCULAR | Status: DC | PRN
  Administered 2021-11-09: 2 mg via INTRAVENOUS

## 2021-11-09 MED ORDER — MIDAZOLAM HCL 2 MG/2ML IJ SOLN
INTRAMUSCULAR | Status: AC
  Filled 2021-11-09: qty 2

## 2021-11-09 MED ORDER — NEOMYCIN-POLYMYXIN B GU 40-200000 IR SOLN
Status: AC
  Filled 2021-11-09: qty 20

## 2021-11-09 MED ORDER — HYDROCODONE-ACETAMINOPHEN 7.5-325 MG PO TABS
1.0000 | ORAL_TABLET | ORAL | Status: DC | PRN
  Administered 2021-11-11: 2 via ORAL
  Filled 2021-11-09: qty 1
  Filled 2021-11-09: qty 2

## 2021-11-09 MED ORDER — PHENOL 1.4 % MT LIQD
1.0000 | OROMUCOSAL | Status: DC | PRN
  Filled 2021-11-09: qty 177

## 2021-11-09 MED ORDER — ONDANSETRON HCL 4 MG/2ML IJ SOLN: 4.0000 mg | Freq: Once | INTRAMUSCULAR | Status: DC | PRN

## 2021-11-09 MED ORDER — NEOMYCIN-POLYMYXIN B GU 40-200000 IR SOLN
Status: DC | PRN
  Administered 2021-11-09: 16 mL

## 2021-11-09 MED ORDER — FAMOTIDINE 20 MG PO TABS
ORAL_TABLET | ORAL | Status: AC
  Administered 2021-11-09: 20 mg via ORAL
  Filled 2021-11-09: qty 1

## 2021-11-09 MED ORDER — HYDROCODONE-ACETAMINOPHEN 5-325 MG PO TABS: 1.0000 | ORAL_TABLET | ORAL | Status: DC | PRN

## 2021-11-09 MED ORDER — FAMOTIDINE 20 MG PO TABS
20.0000 mg | ORAL_TABLET | Freq: Three times a day (TID) | ORAL | Status: DC
  Administered 2021-11-09 – 2021-11-12 (×8): 20 mg via ORAL
  Filled 2021-11-09 (×8): qty 1

## 2021-11-09 MED ORDER — 0.9 % SODIUM CHLORIDE (POUR BTL) OPTIME
TOPICAL | Status: DC | PRN
  Administered 2021-11-09: 1000 mL

## 2021-11-09 MED ORDER — BUTALBITAL-APAP-CAFFEINE 50-325-40 MG PO TABS: 1.0000 | ORAL_TABLET | Freq: Four times a day (QID) | ORAL | Status: DC | PRN

## 2021-11-09 MED ORDER — TOPIRAMATE 100 MG PO TABS
50.0000 mg | ORAL_TABLET | Freq: Every day | ORAL | Status: DC
  Administered 2021-11-10 – 2021-11-12 (×3): 50 mg via ORAL
  Filled 2021-11-09 (×4): qty 0.5
  Filled 2021-11-09 (×2): qty 2

## 2021-11-09 MED ORDER — SODIUM CHLORIDE 0.9 % IV SOLN: INTRAVENOUS | Status: DC

## 2021-11-09 MED ORDER — HYDROCODONE-ACETAMINOPHEN 10-325 MG PO TABS
1.0000 | ORAL_TABLET | ORAL | Status: DC | PRN
  Administered 2021-11-10: 1 via ORAL
  Administered 2021-11-10 – 2021-11-12 (×2): 2 via ORAL
  Filled 2021-11-09: qty 2
  Filled 2021-11-09: qty 1
  Filled 2021-11-09: qty 2

## 2021-11-09 MED ORDER — BISACODYL 10 MG RE SUPP: 10.0000 mg | Freq: Every day | RECTAL | Status: DC | PRN

## 2021-11-09 MED ORDER — ORAL CARE MOUTH RINSE: 15.0000 mL | Freq: Once | OROMUCOSAL | Status: AC

## 2021-11-09 MED ORDER — CEFAZOLIN SODIUM-DEXTROSE 2-4 GM/100ML-% IV SOLN
2.0000 g | Freq: Four times a day (QID) | INTRAVENOUS | Status: AC
  Administered 2021-11-09 (×2): 2 g via INTRAVENOUS
  Filled 2021-11-09 (×2): qty 100

## 2021-11-09 MED ORDER — TRAMADOL HCL 50 MG PO TABS
50.0000 mg | ORAL_TABLET | Freq: Four times a day (QID) | ORAL | Status: DC
  Administered 2021-11-09 – 2021-11-12 (×13): 50 mg via ORAL
  Filled 2021-11-09 (×14): qty 1

## 2021-11-09 MED ORDER — ACETAMINOPHEN 325 MG PO TABS: 325.0000 mg | ORAL_TABLET | Freq: Four times a day (QID) | ORAL | Status: DC | PRN

## 2021-11-09 MED ORDER — FLEET ENEMA 7-19 GM/118ML RE ENEM: 1.0000 | ENEMA | Freq: Once | RECTAL | Status: DC | PRN

## 2021-11-09 MED ORDER — CHLORHEXIDINE GLUCONATE 0.12 % MT SOLN: 15.0000 mL | Freq: Once | OROMUCOSAL | Status: AC

## 2021-11-09 MED ORDER — BUPIVACAINE-EPINEPHRINE (PF) 0.25% -1:200000 IJ SOLN
INTRAMUSCULAR | Status: AC
  Filled 2021-11-09: qty 30

## 2021-11-09 MED ORDER — IBUPROFEN-FAMOTIDINE 800-26.6 MG PO TABS: 1.0000 | ORAL_TABLET | Freq: Three times a day (TID) | ORAL | Status: DC

## 2021-11-09 MED ORDER — BUPIVACAINE LIPOSOME 1.3 % IJ SUSP
INTRAMUSCULAR | Status: AC
  Filled 2021-11-09: qty 20

## 2021-11-09 MED ORDER — SODIUM CHLORIDE FLUSH 0.9 % IV SOLN
INTRAVENOUS | Status: AC
  Filled 2021-11-09: qty 40

## 2021-11-09 MED ORDER — PROPOFOL 10 MG/ML IV BOLUS
INTRAVENOUS | Status: AC
  Filled 2021-11-09: qty 20

## 2021-11-09 MED ORDER — MORPHINE SULFATE (PF) 10 MG/ML IV SOLN
INTRAVENOUS | Status: AC
  Filled 2021-11-09: qty 1

## 2021-11-09 MED ORDER — METOCLOPRAMIDE HCL 5 MG/ML IJ SOLN: 5.0000 mg | Freq: Three times a day (TID) | INTRAMUSCULAR | Status: DC | PRN

## 2021-11-09 MED ORDER — CHLORHEXIDINE GLUCONATE CLOTH 2 % EX PADS
6.0000 | MEDICATED_PAD | Freq: Every day | CUTANEOUS | Status: DC
  Administered 2021-11-09: 6 via TOPICAL

## 2021-11-09 SURGICAL SUPPLY — 71 items
BLADE SAGITTAL 25.0X1.19X90 (BLADE) ×2 IMPLANT
BLADE SAW 90X13X1.19 OSCILLAT (BLADE) ×2 IMPLANT
BNDG COHESIVE 4X5 TAN ST LF (GAUZE/BANDAGES/DRESSINGS) ×2 IMPLANT
BNDG ELASTIC 6X5.8 VLCR STR LF (GAUZE/BANDAGES/DRESSINGS) ×2 IMPLANT
CANISTER WOUND CARE 500ML ATS (WOUND CARE) ×2 IMPLANT
CEMENT HV SMART SET (Cement) ×4 IMPLANT
CHLORAPREP W/TINT 26 (MISCELLANEOUS) ×4 IMPLANT
COOLER POLAR GLACIER W/PUMP (MISCELLANEOUS) ×2 IMPLANT
CUFF TOURN SGL QUICK 24 (TOURNIQUET CUFF)
CUFF TOURN SGL QUICK 34 (TOURNIQUET CUFF)
CUFF TRNQT CYL 24X4X16.5-23 (TOURNIQUET CUFF) IMPLANT
CUFF TRNQT CYL 34X4.125X (TOURNIQUET CUFF) IMPLANT
DRAPE 3/4 80X56 (DRAPES) ×4 IMPLANT
DRSG MEPILEX SACRM 8.7X9.8 (GAUZE/BANDAGES/DRESSINGS) ×2 IMPLANT
ELECT CAUTERY BLADE 6.4 (BLADE) ×2 IMPLANT
ELECT REM PT RETURN 9FT ADLT (ELECTROSURGICAL) ×2
ELECTRODE REM PT RTRN 9FT ADLT (ELECTROSURGICAL) ×1 IMPLANT
FEM COMP 5 LT CEMENT 02120005L (Femur) ×2 IMPLANT
GAUZE 4X4 16PLY ~~LOC~~+RFID DBL (SPONGE) ×2 IMPLANT
GAUZE SPONGE 4X4 12PLY STRL (GAUZE/BANDAGES/DRESSINGS) ×2 IMPLANT
GAUZE XEROFORM 1X8 LF (GAUZE/BANDAGES/DRESSINGS) ×2 IMPLANT
GLOVE SURG ORTHO LTX SZ8 (GLOVE) ×2 IMPLANT
GLOVE SURG SYN 9.0  PF PI (GLOVE) ×1
GLOVE SURG SYN 9.0 PF PI (GLOVE) ×1 IMPLANT
GLOVE SURG UNDER LTX SZ8 (GLOVE) ×2 IMPLANT
GLOVE SURG UNDER POLY LF SZ9 (GLOVE) ×2 IMPLANT
GOWN SRG 2XL LVL 4 RGLN SLV (GOWNS) ×1 IMPLANT
GOWN STRL NON-REIN 2XL LVL4 (GOWNS) ×1
GOWN STRL REUS W/ TWL LRG LVL3 (GOWN DISPOSABLE) ×1 IMPLANT
GOWN STRL REUS W/ TWL XL LVL3 (GOWN DISPOSABLE) ×1 IMPLANT
GOWN STRL REUS W/TWL LRG LVL3 (GOWN DISPOSABLE) ×1
GOWN STRL REUS W/TWL XL LVL3 (GOWN DISPOSABLE) ×1
HOLDER FOLEY CATH W/STRAP (MISCELLANEOUS) ×2 IMPLANT
IRRIGATION SURGIPHOR STRL (IV SOLUTION) IMPLANT
IV NS IRRIG 3000ML ARTHROMATIC (IV SOLUTION) ×2 IMPLANT
KIT PREVENA INCISION MGT20CM45 (CANNISTER) ×2 IMPLANT
KIT TURNOVER KIT A (KITS) ×2 IMPLANT
MANIFOLD NEPTUNE II (INSTRUMENTS) ×4 IMPLANT
NDL SAFETY ECLIPSE 18X1.5 (NEEDLE) ×1 IMPLANT
NEEDLE HYPO 18GX1.5 SHARP (NEEDLE) ×1
NEEDLE SPNL 18GX3.5 QUINCKE PK (NEEDLE) ×2 IMPLANT
NEEDLE SPNL 20GX3.5 QUINCKE YW (NEEDLE) ×2 IMPLANT
NS IRRIG 1000ML POUR BTL (IV SOLUTION) ×2 IMPLANT
PACK TOTAL KNEE (MISCELLANEOUS) ×2 IMPLANT
PAD WRAPON POLAR KNEE (MISCELLANEOUS) IMPLANT
PAD WRAPON POLOR MULTI XL (MISCELLANEOUS) ×1 IMPLANT
PATELLA RESURFACING MEDACTA SZ (Bone Implant) ×2 IMPLANT
PENCIL SMOKE EVACUATOR COATED (MISCELLANEOUS) ×2 IMPLANT
PULSAVAC PLUS IRRIG FAN TIP (DISPOSABLE) ×2
SCALPEL PROTECTED #10 DISP (BLADE) ×4 IMPLANT
SPONGE T-LAP 18X18 ~~LOC~~+RFID (SPONGE) ×6 IMPLANT
STAPLER SKIN PROX 35W (STAPLE) ×2 IMPLANT
STEM EXTENSION 11MMX30MM (Stem) ×2 IMPLANT
SUCTION FRAZIER HANDLE 10FR (MISCELLANEOUS) ×1
SUCTION TUBE FRAZIER 10FR DISP (MISCELLANEOUS) ×1 IMPLANT
SUT DVC 2 QUILL PDO  T11 36X36 (SUTURE) ×1
SUT DVC 2 QUILL PDO T11 36X36 (SUTURE) ×1 IMPLANT
SUT ETHIBOND 2 V 37 (SUTURE) IMPLANT
SUT V-LOC 90 ABS DVC 3-0 CL (SUTURE) ×2 IMPLANT
SYR 20ML LL LF (SYRINGE) ×2 IMPLANT
SYR 50ML LL SCALE MARK (SYRINGE) ×4 IMPLANT
TIBIAL INSERT SZ4 LT 02120410F (Insert) ×2 IMPLANT
TIBIAL TRAY FIXED MEDACTA 0207 (Bone Implant) ×2 IMPLANT
TIP FAN IRRIG PULSAVAC PLUS (DISPOSABLE) ×1 IMPLANT
TOWEL OR 17X26 4PK STRL BLUE (TOWEL DISPOSABLE) ×2 IMPLANT
TOWER CARTRIDGE SMART MIX (DISPOSABLE) ×2 IMPLANT
TRAY FOLEY MTR SLVR 16FR STAT (SET/KITS/TRAYS/PACK) ×2 IMPLANT
WATER STERILE IRR 500ML POUR (IV SOLUTION) ×2 IMPLANT
WRAP-ON POLOR PAD MULTI XL (MISCELLANEOUS) ×1
WRAPON POLAR PAD KNEE (MISCELLANEOUS)
WRAPON POLOR PAD MULTI XL (MISCELLANEOUS) ×1

## 2021-11-09 NOTE — TOC Progression Note (Signed)
Transition of Care [facility]) - Progression Note    Patient Details  Name: Monique Patton MRN: [id number] Date of Birth: 20-Apr-1965  [facility] CM/SW Contact  Caryn Section, RN Phone Number: 11/09/2021, 4:41 PM  Clinical Narrative:   [facility] is [facility] list for this week as per [place], awaiting therapy notes for recommendations         Expected Discharge Plan and Services                                                 Social Determinants of Health (SDOH) Interventions    Readmission Risk Interventions No flowsheet data found.

## 2021-11-09 NOTE — Plan of Care (Signed)

## 2021-11-09 NOTE — Evaluation (Signed)
Physical Therapy Evaluation Patient Details Name: Monique Patton MRN: [id number] DOB: 17-Sep-1965 Today's Date: 11/09/2021  History of Present Illness  Pt s/p TKA by Dr. Rosita Kea on 11/09/21. Pt has signficant medical history of arthritis, migraines and obesity. Pt has never had reports of blood clots in her history.  Clinical Impression  Pt was seated in hospital bed with nursing staff member present upon PT arrival. Nurse reported pt was experiencing very high levels of pain at this time secondary to nerve block wearing off following discharge from post op. Pt reports levels of pain 10/10 at this time. Pt was provided with option for PT coming back at later time but elected to proceed with therapy at this time. Pt was assess for sensation in her involved LE and sensation appeared to be in tact at this time. Pt also able to initiate hip and knee exercises as well as ankle pumps without significant increase in pain. Pt able to come to sitting EOB with mod A from PT in order to maneuver involved LE. Pt only able to sit EOB for several seconds prior to feelings of lightheadedness and extreme pain. Pt to be assessed further in therapy sessions tomorrow. Pt has 2 STE home with no access to rails. Pt has husband and sone for support at home following discharge. Pt will also need to be provided with formal home HEP and trailed with standing, transfers and ambulation during therapy tomorrow.      Recommendations for follow up therapy are one component of a multi-disciplinary discharge planning process, led by the attending physician.  Recommendations may be updated based on patient status, additional functional criteria and insurance authorization.  Follow Up Recommendations Home health PT    Assistance Recommended at Discharge Intermittent Supervision/Assistance  Functional Status Assessment Patient has had a recent decline in their functional status and demonstrates the ability to make significant improvements  in function in a reasonable and predictable amount of time.  Equipment Recommendations  Rolling walker (2 wheels);BSC/3in1    Recommendations for Other Services       Precautions / Restrictions Precautions Precautions: Knee Restrictions Weight Bearing Restrictions: Yes LLE Weight Bearing: Weight bearing as tolerated      Mobility  Bed Mobility Overal bed mobility: Needs Assistance Bed Mobility: Supine to Sit     Supine to sit: Mod assist     General bed mobility comments: Mod A from PT to maneuver L LE and pt required ncreased time and sig UE support to complete task.    Transfers Overall transfer level: Needs assistance                      Ambulation/Gait               General Gait Details: Unable to assess a tthis visit secondary to high post op pain levels and some lightheadedness when transitioning to seated position  Stairs            Wheelchair Mobility    Modified Rankin (Stroke Patients Only)       Balance                                             Pertinent Vitals/Pain Pain Assessment: 0-10 Pain Score: 10-Worst pain ever Pain Location: left knee Pain Descriptors / Indicators: Aching Pain Intervention(s): Limited activity within patient's tolerance;Monitored during  session;Premedicated before session    Home Living Family/patient expects to be discharged to:: Private residence Living Arrangements: Spouse/significant other;Parent Available Help at Discharge: Family Type of Home: House Home Access: Stairs to enter Entrance Stairs-Rails: Right Entrance Stairs-Number of Steps: 2   Home Layout: One level Home Equipment: None Additional Comments: Pt will be receiving adaptive equipment prior to discharge    Prior Function Prior Level of Function : Independent/Modified Independent                     Hand Dominance        Extremity/Trunk Assessment        Lower Extremity Assessment Lower  Extremity Assessment: LLE deficits/detail LLE: Unable to fully assess due to pain LLE Sensation: WNL       Communication   Communication: No difficulties  Cognition Arousal/Alertness: Awake/alert Behavior During Therapy: WFL for tasks assessed/performed Overall Cognitive Status: Within Functional Limits for tasks assessed                                          General Comments      Exercises Total Joint Exercises Quad Sets: Left;10 reps;Supine Hip ABduction/ADduction: Left;10 reps;Supine Goniometric ROM: L knee extension in supine 14 degrees from full extension. L knee flexion to approximately 30 degrees in seated position but pt coulnd not tolerate formal gonniometric flexion ROM secondary to high pain levels. Other Exercises Other Exercises: Transitioned from supine to seated position in bed with mod-max A from PT to maneuver L LE and with significant UE support. Pt only able to maintain seated position for several seconds secondary to pain and dizziness. Other Exercises: Pt was assisted via max A x2 for repositioning in bed following transition from seatyed to supine.   Assessment/Plan    PT Assessment Patient needs continued PT services  PT Problem List Decreased strength;Decreased range of motion;Decreased activity tolerance;Decreased balance;Decreased mobility;Decreased knowledge of use of DME;Decreased knowledge of precautions;Obesity;Pain       PT Treatment Interventions DME instruction;Gait training;Stair training;Functional mobility training;Therapeutic activities;Therapeutic exercise;Balance training;Neuromuscular re-education;Patient/family education    PT Goals (Current goals can be found in the Care Plan section)  Acute Rehab PT Goals Patient Stated Goal: Improve pain levels and improve kne function PT Goal Formulation: With patient Time For Goal Achievement: 11/23/21 Potential to Achieve Goals: Good    Frequency BID   Barriers to  discharge        Co-evaluation               AM-PAC PT "6 Clicks" Mobility  Outcome Measure                  End of Session   Activity Tolerance: Patient tolerated treatment well;Patient limited by pain Patient left: with call bell/phone within reach;with bed alarm set;with family/visitor present   PT Visit Diagnosis: Other abnormalities of gait and mobility (R26.89);Muscle weakness (generalized) (M62.81);Unsteadiness on feet (R26.81)    Time: 1610-9604 PT Time Calculation (min) (ACUTE ONLY): 27 min   Charges:   PT Evaluation $PT Eval Low Complexity: 1 Low PT Treatments $Therapeutic Exercise: 8-22 mins        Thresa Ross PT, DPT    Norman Herrlich 11/09/2021, 2:48 PM

## 2021-11-09 NOTE — Op Note (Signed)
11/09/2021  9:55 AM  PATIENT:  Monique Patton  56 y.o. female  PRE-OPERATIVE DIAGNOSIS:  Primary osteoarthritis of left knee  M17.12  POST-OPERATIVE DIAGNOSIS:  Primary osteoarthritis of left knee  M17.12  PROCEDURE:  Procedure(s): TOTAL KNEE ARTHROPLASTY (Left)  SURGEON: Leitha Schuller, MD  ASSISTANTS: Cranston Neighbor, PA-C  ANESTHESIA:   spinal  EBL:  Total I/O In: 850 [I.V.:700; IV Piggyback:150] Out: -   BLOOD ADMINISTERED:none  DRAINS:  Incisional wound VAC    LOCAL MEDICATIONS USED:  MARCAINE    and OTHER Exparel and morphine  SPECIMEN:  No Specimen  DISPOSITION OF SPECIMEN:  N/A  COUNTS:  YES  TOURNIQUET:   Total Tourniquet Time Documented: Thigh (Left) - 86 minutes Total: Thigh (Left) - 86 minutes   IMPLANTS: Medacta GM K sphere 5 left femur, 4 left tibia with 10 mm insert and short stem, size 3 patella all components cemented  DICTATION: .Dragon Dictation  patient was brought to the operating room and spinal anesthesia was obtained.  After prepping and draping the left leg in sterile fashion, and after patient identification and timeout procedures were completed, tourniquet was raised  and midline skin incision was made followed by medial parapatellar arthrotomy with severe medial compartment osteoarthritis, a severe patellofemoral arthritis and severe lateral compartment arthritis, partial synovectomy was also carried out there was exposed bone in all compartments.   There was extensive inflammatory tissue throughout the joint the ACL and PCL and fat pad were excised along with anterior horns of the meniscus. The proximal tibia cutting guide from  the extra medullary  system was applied and the proximal tibia cut carried out.  The distal femoral cut was carried out in a similar fashion using the intramedullary guide.  9 mm distal femur cut was made and sizing guide applied and sized to a size 5.     The 5 femoral cutting guide applied with anterior posterior and  chamfer cuts made.  The posterior horns of the menisci were removed at this point.   Injection of the above medication was carried out after the femoral and tibial cuts were carried out.  The 4 baseplate trial was placed pinned into position and proximal tibial preparation carried out with drilling hand reaming and the keel punch followed by placement of the 3 femur and sizing the tibial insert size 10 millimeter gave the best fit with stability and full extension.  The distal femoral drill holes were made in the notch cut for the trochlear groove was then carried out with trials were then removed the patella was cut using the patellar cutting guide and it sized to a size 3 after drill holes have been made  The knee was irrigated with pulsatile lavage and the bony surfaces dried the tibial component was cemented into place first.  Excess cement was removed and the polyethylene insert placed with a torque screw placed with a torque screwdriver tightened.  The distal femoral component was placed and the knee was held in extension as the patellar button was clamped into place.  After the cement was set, excess cement was removed and the knee was again irrigated thoroughly thoroughly irrigated.  The tourniquet was let down and hemostasis checked with electrocautery. The arthrotomy was repaired with a heavy Quill suture, along with #2 Ethibond, followed by 3-0 V lock subcuticular closure, skin staples followed by incisional wound VAC and Polar Care.Marland Kitchen  PLAN OF CARE: Admit for overnight observation  PATIENT DISPOSITION:  PACU - hemodynamically  stable.

## 2021-11-09 NOTE — Evaluation (Signed)
Occupational Therapy Evaluation Patient Details Name: Monique Patton MRN: [id number] DOB: December 05, 1965 Today's Date: 11/09/2021   History of Present Illness Pt s/p TKA by Dr. Rosita Kea on 11/09/21. Pt has signficant medical history of arthritis, migraines and obesity. Pt has never had reports of blood clots in her history.   Clinical Impression   Pt seen for OT evaluation this date. Note: pt VERY pain limited making recommendations contingent on further assessment with ADL mobility. Pt reports living at home with spouse and being INDEP at baseline with no AD. Pt presents this date with significant post-op pain and limited ROM, impacting her ability to safely or efficiently contribute to ADLs/ADL mobility at this time. OT engages pt and family in ed re: polar care mgt, compression stocking mgt, importance of OOB activity, modified LB ADLs. Pt with good functional upper body strength. She declines to participate in bed mobility or transfers with OT despite encouragement and education, citing pain 10/10. LPN notified. Will continue to follow acutely. At this time, recommending pt d/c home with HHOT f/u, but again, this is contingent on her ability to better tolerate ADL mobility once pain better managed.      Recommendations for follow up therapy are one component of a multi-disciplinary discharge planning process, led by the attending physician.  Recommendations may be updated based on patient status, additional functional criteria and insurance authorization.   Follow Up Recommendations  Home health OT    Assistance Recommended at Discharge Frequent or constant Supervision/Assistance  Functional Status Assessment  Patient has had a recent decline in their functional status and demonstrates the ability to make significant improvements in function in a reasonable and predictable amount of time.  Equipment Recommendations  BSC/3in1;Tub/shower seat;Other (comment) (2ww)    Recommendations for Other  Services       Precautions / Restrictions Precautions Precautions: Knee Restrictions Weight Bearing Restrictions: Yes LLE Weight Bearing: Weight bearing as tolerated      Mobility Bed Mobility Overal bed mobility: Needs Assistance Bed Mobility: Supine to Sit     Supine to sit: Mod assist     General bed mobility comments: deferred, pt declines citing pain    Transfers Overall transfer level: Needs assistance                 General transfer comment: pt declines      Balance                                           ADL either performed or assessed with clinical judgement   ADL                                         General ADL Comments: requires MAX A for LB ADLs, SETUP for seated UB ADLs     Vision Patient Visual Report: No change from baseline       Perception     Praxis      Pertinent Vitals/Pain Pain Assessment: 0-10 Pain Score: 10-Worst pain ever Pain Location: left knee Pain Descriptors / Indicators: Aching Pain Intervention(s): Limited activity within patient's tolerance;Monitored during session     Hand Dominance     Extremity/Trunk Assessment Upper Extremity Assessment Upper Extremity Assessment: Overall WFL for tasks assessed   Lower Extremity Assessment Lower Extremity  Assessment: Defer to PT evaluation;LLE deficits/detail LLE Deficits / Details: expected post-op limitations, mostly pain limited today LLE: Unable to fully assess due to pain LLE Sensation: WNL       Communication Communication Communication: No difficulties   Cognition Arousal/Alertness: Awake/alert Behavior During Therapy: WFL for tasks assessed/performed Overall Cognitive Status: Within Functional Limits for tasks assessed                                 General Comments: somehwat drowsy post-op'ly     General Comments  unable to assess balance as pt declines to come to EOB sitting with OT or attempt  transfers    Exercises Total Joint Exercises Quad Sets: Left;10 reps;Supine Hip ABduction/ADduction: Left;10 reps;Supine Goniometric ROM: L knee extension in supine 14 degrees from full extension. L knee flexion to approximately 30 degrees in seated position but pt coulnd not tolerate formal gonniometric flexion ROM secondary to high pain levels. Other Exercises Other Exercises: OT engages pt and family in ed re: polar care mgt, compression stocking mgt, importance of OOB activity, modified LB ADLs. Other Exercises: Pt was assisted via max A x2 for repositioning in bed following transition from seatyed to supine.   Shoulder Instructions      Home Living Family/patient expects to be discharged to:: Private residence Living Arrangements: Spouse/significant other;Parent Available Help at Discharge: Family (son and spouse able to help intermittently) Type of Home: House Home Access: Stairs to enter Secretary/administrator of Steps: 2 Entrance Stairs-Rails: Right Home Layout: One level               Home Equipment: None   Additional Comments: Pt will be receiving adaptive equipment prior to discharge      Prior Functioning/Environment Prior Level of Function : Independent/Modified Independent                        OT Problem List: Decreased strength;Decreased activity tolerance;Impaired balance (sitting and/or standing);Decreased knowledge of use of DME or AE;Pain      OT Treatment/Interventions: Self-care/ADL training;Therapeutic exercise;DME and/or AE instruction;Therapeutic activities;Patient/family education    OT Goals(Current goals can be found in the care plan section) Acute Rehab OT Goals Patient Stated Goal: to manage pain and be able to move more OT Goal Formulation: With patient Time For Goal Achievement: 11/23/21 Potential to Achieve Goals: Good ADL Goals Pt Will Perform Lower Body Dressing: with min assist;with adaptive equipment;sit to/from  stand Pt Will Transfer to Toilet: with min guard assist;ambulating;bedside commode Pt Will Perform Toileting - Clothing Manipulation and hygiene: with min guard assist;sitting/lateral leans Pt/caregiver will Perform Home Exercise Program: Increased strength;Both right and left upper extremity  OT Frequency: Min 2X/week   Barriers to D/C:            Co-evaluation              AM-PAC OT "6 Clicks" Daily Activity     Outcome Measure Help from another person eating meals?: None Help from another person taking care of personal grooming?: A Little Help from another person toileting, which includes using toliet, bedpan, or urinal?: A Lot Help from another person bathing (including washing, rinsing, drying)?: A Lot Help from another person to put on and taking off regular upper body clothing?: A Little Help from another person to put on and taking off regular lower body clothing?: A Lot 6 Click Score: 16   End  of Session Nurse Communication: Mobility status  Activity Tolerance: Patient limited by pain Patient left: in bed;with call bell/phone within reach;with bed alarm set;with family/visitor present  OT Visit Diagnosis: Muscle weakness (generalized) (M62.81);Pain Pain - Right/Left: Left Pain - part of body: Knee                Time: 7902-4097 OT Time Calculation (min): 24 min Charges:  OT General Charges $OT Visit: 1 Visit OT Evaluation $OT Eval Moderate Complexity: 1 Mod OT Treatments $Self Care/Home Management : 8-22 mins  Rejeana Brock, MS, OTR/L ascom [phone] 11/09/21, 5:59 PM

## 2021-11-09 NOTE — H&P (Signed)
Chief Complaint  Patient presents with   Left Knee - Pain, Follow-up    History of the Present Illness: Monique Patton is a 56 y.o. female here today.   The patient presents for a H and P for left total knee arthroplasty scheduled for 11/09/2021. The patient states she has had left knee pain for 5 years.  The patient is a diabetic and has dentures. She has never had a blood clot.  The patient is employed as a Architectural technologist at [employer].  I have reviewed past medical, surgical, social and family history, and allergies as documented in the EMR.  Past Medical History: Past Medical History:  Diagnosis Date   Arthritis   Migraine headache   Obesity   Past Surgical History: Past Surgical History:  Procedure Laterality Date   back surgery   COLONOSCOPY 01/07/2018  Normal/ No specimens collected/PHx Cp/ Repeat in 5 years/ TKT   GASTRIC BYPASS OPEN   HYSTERECTOMY   neck surgery 11/03/13   sleep apnea  uvula   Past Family History: Family History  Problem Relation Age of Onset   Stroke Father   Diabetes type II Father   High blood pressure (Hypertension) Father   Myocardial Infarction (Heart attack) Father   Medications: Current Outpatient Medications Ordered in Epic  Medication Sig Dispense Refill   cetirizine (ZYRTEC) 10 mg capsule Take 10 mg by mouth once daily   cyclobenzaprine (FLEXERIL) 10 MG tablet Take 10 mg by mouth 3 (three) times daily.   ibuprofen (MOTRIN) 200 MG tablet Take 800 mg by mouth as needed for Pain   levocetirizine (XYZAL) 5 MG tablet Take 1 tablet by mouth once daily   MOUNJARO 5 mg/0.5 mL PnIj INJECT 5 MG UNDER THE SKIN WEEKLY   OZEMPIC 0.25 mg or 0.5 mg(2 mg/1.5 mL) pen injector   rosuvastatin (CRESTOR) 20 MG tablet Take 1 tablet by mouth once daily   traMADoL (ULTRAM) 50 mg tablet Take 1 tablet (50 mg total) by mouth every 6 (six) hours as needed for Pain 40 tablet 2   No current Epic-ordered facility-administered medications on  file.   Allergies: Allergies  Allergen Reactions   Other Itching and Rash  Cat Dander   Animal Dander Rash   Tramadol Rash   Penicillins Itching and Rash   Shellfish Containing Products Rash    Body mass index is 44.87 kg/m.  Review of Systems: A comprehensive 14 point ROS was performed, reviewed, and the pertinent orthopaedic findings are documented in the HPI.  Vitals:  10/11/21 0933  BP: (!) 146/82    General Physical Examination:   General/Constitutional: No apparent distress: well-nourished and well developed. Eyes: Pupils equal, round with synchronous movement. Lungs: Clear to auscultation HEENT: Full dentures Vascular: No edema, swelling or tenderness, except as noted in detailed exam. Cardiac: Heart rate and rhythm is regular. Integumentary: No impressive skin lesions present, except as noted in detailed exam. Neuro/Psych: Normal mood and affect, oriented to person, place and time.  On exam, left knee has range of motion of 0-70 degrees with pain. Varus deformity of the left knee. Marked crepitation with range of motion.  Radiographs:  No new imaging studies were obtained today.  Assessment: ICD-10-CM  1. Primary osteoarthritis of both knees M17.0   Plan:  The patient has clinical findings of severe bilateral knee osteoarthritis, left worse than right.  We discussed the patient's prior x-ray findings. We will plan for left total knee arthroplasty on 11/09/2021. I explained the surgery  and postoperative course in detail. I gave the patient some knee exercises to do at home.  Surgical Risks:  The nature of the condition and the proposed procedure has been reviewed in detail with the patient. Surgical versus non-surgical options and prognosis for recovery have been reviewed and the inherent risks and benefits of each have been discussed including the risks of infection, bleeding, injury to nerves/blood vessels/tendons, incomplete relief of symptoms,  persisting pain and/or stiffness, loss of function, complex regional pain syndrome, failure of the procedure, as appropriate.  Teeth: Full dentures, full upper and lower.  Scribe Attestation: I, Dawn Royse, am acting as scribe for [employer], MD.   Electronically signed by Marlena Clipper, MD at 10/12/2021 8:39 PM EDT  Reviewed  H+P. No changes noted.

## 2021-11-09 NOTE — Anesthesia Preprocedure Evaluation (Signed)
Anesthesia Evaluation  Patient identified by MRN, date of birth, ID band Patient awake    Reviewed: Allergy & Precautions, H&P , NPO status , Patient's Chart, lab work & pertinent test results, reviewed documented beta blocker date and time   Airway Mallampati: III   Neck ROM: full    Dental  (+) Edentulous Upper, Edentulous Lower   Pulmonary neg pulmonary ROS, former smoker,    Pulmonary exam normal        Cardiovascular Exercise Tolerance: Poor Normal cardiovascular exam+ Valvular Problems/Murmurs  Rhythm:regular Rate:Normal     Neuro/Psych  Headaches,  Neuromuscular disease negative psych ROS   GI/Hepatic negative GI ROS, Neg liver ROS,   Endo/Other  negative endocrine ROS  Renal/GU negative Renal ROS  negative genitourinary   Musculoskeletal   Abdominal   Peds  Hematology negative hematology ROS (+)   Anesthesia Other Findings Past Medical History: No date: Allergy No date: Arthritis No date: Eczema No date: H/O gastric bypass No date: Heart murmur     Comment:  since birth No date: Persistent headaches No date: Pre-diabetes No date: Vitamin D deficiency Past Surgical History: No date: ABDOMINAL HYSTERECTOMY No date: ADENIDECTOMY 03/13/2012: ANTERIOR CERVICAL DECOMP/DISCECTOMY FUSION     Comment:  Procedure: ANTERIOR CERVICAL DECOMPRESSION/DISCECTOMY               FUSION 2 LEVELS;  Surgeon: Hewitt Shorts, MD;                Location: [facility];  Service: Neurosurgery;                Laterality: N/A;  Cervical Five-Six Cervical Six-Seven               Anterior Cervical Decompression with fusion plating and               bonegraft 01/07/2018: COLONOSCOPY WITH PROPOFOL; N/A     Comment:  Procedure: COLONOSCOPY WITH PROPOFOL;  Surgeon: [place],               Boykin Nearing, MD;  Location: [facility];  Service:               Gastroenterology;  Laterality: N/A; 2003: GASTRIC BYPASS 05/14/2013: LUMBAR  LAMINECTOMY/DECOMPRESSION MICRODISCECTOMY; Left     Comment:  Procedure: LUMBAR LAMINECTOMY/DECOMPRESSION               MICRODISCECTOMY 1 LEVEL;  Surgeon: Hewitt Shorts, MD;              Location: [facility];  Service: Neurosurgery;                Laterality: Left;  LEFT Lumbar four-five4 extraforaminal               microdiskectomy 11/05/2013: POSTERIOR CERVICAL FUSION/FORAMINOTOMY; N/A     Comment:  Procedure: Cervical five-six posterior arthrodesis and               instrumentation;  Surgeon: Hewitt Shorts, MD;                Location: [facility];  Service: Neurosurgery;                Laterality: N/A;  Cervical five-six posterior arthrodesis              and instrumentation No date: TONSILLECTOMY BMI    Body Mass Index: 45.37 kg/m     Reproductive/Obstetrics negative OB ROS  Anesthesia Physical Anesthesia Plan  ASA: 3  Anesthesia Plan: Spinal   Post-op Pain Management:    Induction:   PONV Risk Score and Plan: 4 or greater  Airway Management Planned:   Additional Equipment:   Intra-op Plan:   Post-operative Plan:   Informed Consent: I have reviewed the patients History and Physical, chart, labs and discussed the procedure including the risks, benefits and alternatives for the proposed anesthesia with the patient or authorized representative who has indicated his/her understanding and acceptance.     Dental Advisory Given  Plan Discussed with: CRNA  Anesthesia Plan Comments: (Risks and benefits of sab despite previous back surgery accepted by patient. ja)        Anesthesia Quick Evaluation

## 2021-11-09 NOTE — Transfer of Care (Signed)
Immediate Anesthesia Transfer of Care Note  Patient: Monique Patton  Procedure(s) Performed: TOTAL KNEE ARTHROPLASTY (Left: Knee)  Patient Location: PACU  Anesthesia Type:Spinal  Level of Consciousness: awake, alert  and oriented  Airway & Oxygen Therapy: Patient Spontanous Breathing  Post-op Assessment: Report given to RN and Post -op Vital signs reviewed and stable  Post vital signs: Reviewed and stable  Last Vitals:  Vitals Value Taken Time  BP    Temp    Pulse    Resp    SpO2      Last Pain:  Vitals:   11/09/21 0655  TempSrc: Temporal  PainSc: 8       Patients Stated Pain Goal: 2 (11/09/21 0655)  Complications: No notable events documented.

## 2021-11-10 DIAGNOSIS — Z87891 Personal history of nicotine dependence: Secondary | ICD-10-CM | POA: Diagnosis not present

## 2021-11-10 DIAGNOSIS — Z90710 Acquired absence of both cervix and uterus: Secondary | ICD-10-CM | POA: Diagnosis not present

## 2021-11-10 DIAGNOSIS — Z6841 Body Mass Index (BMI) 40.0 and over, adult: Secondary | ICD-10-CM | POA: Diagnosis not present

## 2021-11-10 DIAGNOSIS — E669 Obesity, unspecified: Secondary | ICD-10-CM | POA: Diagnosis present

## 2021-11-10 DIAGNOSIS — Z9884 Bariatric surgery status: Secondary | ICD-10-CM | POA: Diagnosis not present

## 2021-11-10 DIAGNOSIS — M1712 Unilateral primary osteoarthritis, left knee: Secondary | ICD-10-CM | POA: Diagnosis present

## 2021-11-10 DIAGNOSIS — Z833 Family history of diabetes mellitus: Secondary | ICD-10-CM | POA: Diagnosis not present

## 2021-11-10 DIAGNOSIS — Z981 Arthrodesis status: Secondary | ICD-10-CM | POA: Diagnosis not present

## 2021-11-10 DIAGNOSIS — Z79899 Other long term (current) drug therapy: Secondary | ICD-10-CM | POA: Diagnosis not present

## 2021-11-10 DIAGNOSIS — E119 Type 2 diabetes mellitus without complications: Secondary | ICD-10-CM | POA: Diagnosis present

## 2021-11-10 DIAGNOSIS — Z20822 Contact with and (suspected) exposure to covid-19: Secondary | ICD-10-CM | POA: Diagnosis present

## 2021-11-10 DIAGNOSIS — Z823 Family history of stroke: Secondary | ICD-10-CM | POA: Diagnosis not present

## 2021-11-10 DIAGNOSIS — Z8249 Family history of ischemic heart disease and other diseases of the circulatory system: Secondary | ICD-10-CM | POA: Diagnosis not present

## 2021-11-10 LAB — BASIC METABOLIC PANEL
Anion gap: 5 (ref 5–15)
BUN: 13 mg/dL (ref 6–20)
CO2: 23 mmol/L (ref 22–32)
Calcium: 8.3 mg/dL — ABNORMAL LOW (ref 8.9–10.3)
Chloride: 108 mmol/L (ref 98–111)
Creatinine, Ser: 0.93 mg/dL (ref 0.44–1.00)
GFR, Estimated: >60 mL/min (ref >60)
Glucose, Bld: 124 mg/dL — ABNORMAL HIGH (ref 70–99)
Potassium: 3.8 mmol/L (ref 3.5–5.1)
Sodium: 136 mmol/L (ref 135–145)

## 2021-11-10 LAB — CBC
HCT: 31.7 % — ABNORMAL LOW (ref 36.0–46.0)
Hemoglobin: 10 g/dL — ABNORMAL LOW (ref 12.0–15.0)
MCH: 26.7 pg (ref 26.0–34.0)
MCHC: 31.5 g/dL (ref 30.0–36.0)
MCV: 84.5 fL (ref 80.0–100.0)
Platelets: 187 10*3/uL (ref 150–400)
RBC: 3.75 MIL/uL — ABNORMAL LOW (ref 3.87–5.11)
RDW: 14.1 % (ref 11.5–15.5)
WBC: 6.3 10*3/uL (ref 4.0–10.5)
nRBC: 0 % (ref 0.0–0.2)

## 2021-11-10 MED ORDER — ENOXAPARIN SODIUM 40 MG/0.4ML IJ SOSY: 40.0000 mg | PREFILLED_SYRINGE | INTRAMUSCULAR | 0 refills | Status: DC

## 2021-11-10 MED ORDER — POLYETHYLENE GLYCOL 3350 17 G PO PACK: 17.0000 g | PACK | Freq: Every day | ORAL | 0 refills | Status: DC | PRN

## 2021-11-10 MED ORDER — DOCUSATE SODIUM 100 MG PO CAPS: 100.0000 mg | ORAL_CAPSULE | Freq: Two times a day (BID) | ORAL | 0 refills | Status: DC

## 2021-11-10 MED ORDER — TRAMADOL HCL 50 MG PO TABS: 50.0000 mg | ORAL_TABLET | Freq: Four times a day (QID) | ORAL | 0 refills | Status: DC | PRN

## 2021-11-10 MED ORDER — HYDROCODONE-ACETAMINOPHEN 10-325 MG PO TABS: 1.0000 | ORAL_TABLET | ORAL | 0 refills | Status: DC | PRN

## 2021-11-10 NOTE — Discharge Summary (Signed)
Physician Discharge Summary  Patient ID: Monique Patton MRN: [id number] DOB/AGE: [birth year] 57 y.o.  Admit date: 11/09/2021 Discharge date: 11/12/21  Admission Diagnoses:  S/P TKR (total knee replacement) using cement, left [Z96.652]   Discharge Diagnoses: Patient Active Problem List   Diagnosis Date Noted   S/P TKR (total knee replacement) using cement, left 11/09/2021   Arthritis 03/09/2021   Body mass index (BMI) 50.0-59.9, adult (HCC) 08/02/2020   Full thickness rotator cuff tear 05/21/2019   Impingement syndrome of shoulder region 01/29/2019   Cervicogenic headache 07/16/2017   Lumbago with sciatica 06/15/2016   Vitamin D deficiency 11/19/2014   DDD (degenerative disc disease), cervical 02/09/2014   Displacement of lumbar intervertebral disc without myelopathy 07/17/2013   Cervical disc herniation 02/20/2012   Neck pain 02/12/2012   Back pain 02/12/2012   H/O gastric bypass     Past Medical History:  Diagnosis Date   Allergy    Arthritis    Eczema    H/O gastric bypass    Heart murmur    since birth   Persistent headaches    Pre-diabetes    Vitamin D deficiency      Transfusion: none   Consultants (if any):   Discharged Condition: Improved  Hospital Course: Monique Patton is an 56 y.o. female who was admitted 11/09/2021 with a diagnosis of <principal problem not specified> and went to the operating room on 11/09/2021 and underwent the above named procedures.    Surgeries: Procedure(s): TOTAL KNEE ARTHROPLASTY on 11/09/2021 Patient tolerated the surgery well. Taken to PACU where she was stabilized and then transferred to the orthopedic floor.  Started on Lovenox 30 mg q 12 hrs. Foot pumps applied bilaterally at 80 mm. Heels elevated on bed with rolled towels. No evidence of DVT. Negative Homan. Physical therapy started on day #1 for gait training and transfer. OT started day #1 for ADL and assisted devices.  Patient's foley was d/c on day #1. Patient's IV  was d/c on day #2.  On post op day # 3 patient was stable and ready for discharge to home with HHPT.  Implants: Medacta GM K sphere 5 left femur, 4 left tibia with 10 mm insert and short stem, size 3 patella all components cemented  She was given perioperative antibiotics:  Anti-infectives (From admission, onward)    Start     Dose/Rate Route Frequency Ordered Stop   11/09/21 1400  ceFAZolin (ANCEF) IVPB 2g/100 mL premix        2 g 200 mL/hr over 30 Minutes Intravenous Every 6 hours 11/09/21 1128 11/09/21 2103   11/09/21 0600  ceFAZolin (ANCEF) IVPB 3g/100 mL premix  Status:  Discontinued        3 g 200 mL/hr over 30 Minutes Intravenous On call to O.R. 11/09/21 0122 11/09/21 0222   11/09/21 0500  ceFAZolin (ANCEF) 3 g in dextrose 5 % 50 mL IVPB        3 g 100 mL/hr over 30 Minutes Intravenous 60 min pre-op 11/09/21 0223 11/09/21 1900     .  She was given sequential compression devices, early ambulation, and Lovenox TEDs for DVT prophylaxis.  She benefited maximally from the hospital stay and there were no complications.    Recent vital signs:  Vitals:   11/12/21 0420 11/12/21 0816  BP: 124/71 (!) 141/88  Pulse: (!) 102 (!) 105  Resp: 17 20  Temp: 98.8 F (37.1 C) 99.3 F (37.4 C)  SpO2: 99% 100%    Recent  laboratory studies:  Lab Results  Component Value Date   HGB 10.0 (L) 11/10/2021   HGB 10.9 (L) 11/09/2021   HGB 11.5 (L) 10/30/2021   Lab Results  Component Value Date   WBC 6.3 11/10/2021   PLT 187 11/10/2021   No results found for: INR Lab Results  Component Value Date   NA 136 11/10/2021   K 3.8 11/10/2021   CL 108 11/10/2021   CO2 23 11/10/2021   BUN 13 11/10/2021   CREATININE 0.93 11/10/2021   GLUCOSE 124 (H) 11/10/2021    Discharge Medications:   Allergies as of 11/12/2021       Reactions   Other Itching, Rash   Cat Dander   Fish Allergy Rash   Penicillins Itching, Rash   Shellfish Allergy Rash        Medication List     STOP  taking these medications    butalbital-acetaminophen-caffeine 50-325-40-30 MG capsule Commonly known as: FIORICET WITH CODEINE   Ibuprofen-Famotidine 800-26.6 MG Tabs       TAKE these medications    cyclobenzaprine 10 MG tablet Commonly known as: FLEXERIL Take 10 mg by mouth every 8 (eight) hours as needed for muscle spasms.   docusate sodium 100 MG capsule Commonly known as: COLACE Take 1 capsule (100 mg total) by mouth 2 (two) times daily.   enoxaparin 40 MG/0.4ML injection Commonly known as: LOVENOX Inject 0.4 mLs (40 mg total) into the skin daily for 14 days.   HYDROcodone-acetaminophen 10-325 MG tablet Commonly known as: NORCO Take 1-2 tablets by mouth every 4 (four) hours as needed for moderate pain.   levocetirizine 5 MG tablet Commonly known as: XYZAL Take 5 mg by mouth every evening.   polyethylene glycol 17 g packet Commonly known as: MIRALAX / GLYCOLAX Take 17 g by mouth daily as needed for mild constipation.   rosuvastatin 20 MG tablet Commonly known as: CRESTOR Take 20 mg by mouth at bedtime.   topiramate 50 MG tablet Commonly known as: TOPAMAX Take 50 mg by mouth daily.   traMADol 50 MG tablet Commonly known as: ULTRAM Take 1 tablet (50 mg total) by mouth every 6 (six) hours as needed. What changed: reasons to take this               Durable Medical Equipment  (From admission, onward)           Start     Ordered   11/09/21 1129  DME Walker rolling  Once       Question Answer Comment  Walker: With 5 Inch Wheels   Patient needs a walker to treat with the following condition S/P TKR (total knee replacement) using cement, left      11/09/21 1128   11/09/21 1129  DME 3 n 1  Once        11/09/21 1128   11/09/21 1129  DME Bedside commode  Once       Question:  Patient needs a bedside commode to treat with the following condition  Answer:  S/P TKR (total knee replacement) using cement, left   11/09/21 1128            Diagnostic  Studies: DG Knee 1-2 Views Left  Result Date: 11/09/2021 CLINICAL DATA:  Status post knee replacement. EXAM: LEFT KNEE - 1-2 VIEW COMPARISON:  None. FINDINGS: Two views study shows tricompartmental knee replacement. Gas in the joint space and adjacent soft tissues is compatible with the immediate postoperative state. Skin staples noted  anteriorly. IMPRESSION: Status post left total knee replacement. No evidence for immediate hardware complication. Electronically Signed   By: Kennith Center M.D.   On: 11/09/2021 10:22    Disposition: home with home health PT      Follow-up Information     Evon Slack, PA-C Follow up in 2 week(s).   Specialties: Orthopedic Surgery, Emergency Medicine Contact information: [address] [place] [address] [phone]                  Signed: Madelyn Flavors 11/12/2021, 10:49 AM

## 2021-11-10 NOTE — Discharge Instructions (Addendum)

## 2021-11-10 NOTE — Progress Notes (Signed)
Patient's pain has increased which is expected as today Is her post op day 1. Patient educated on the pain and expectations of pain and discomfort related to her knee replacement. Patient refuses to use the Zero Knee pillow or support of rolled towel under the ankle as it was thought to be more tolerable. Patient reminded to hold affected limb stationary avoiding turns or manipulation of the surgical site. I will continue to monitor the patient, surgical site, and her pain.

## 2021-11-10 NOTE — Anesthesia Postprocedure Evaluation (Signed)
Anesthesia Post Note  Patient: Monique Patton  Procedure(s) Performed: TOTAL KNEE ARTHROPLASTY (Left: Knee)  Patient location during evaluation: Nursing Unit Anesthesia Type: Spinal Level of consciousness: oriented and awake and alert Pain management: pain level controlled Vital Signs Assessment: post-procedure vital signs reviewed and stable Respiratory status: spontaneous breathing and respiratory function stable Cardiovascular status: blood pressure returned to baseline and stable Postop Assessment: no headache, no backache, no apparent nausea or vomiting and patient able to bend at knees Anesthetic complications: no   No notable events documented.   Last Vitals:  Vitals:   11/09/21 2334 11/10/21 0444  BP: (!) 151/75 (!) 160/82  Pulse: (!) 110 (!) 105  Resp: 17 17  Temp: 37.4 C 36.8 C  SpO2: 100% 100%    Last Pain:                 Jules Schick

## 2021-11-10 NOTE — Progress Notes (Signed)
   Subjective: 1 Day Post-Op Procedure(s) (LRB): TOTAL KNEE ARTHROPLASTY (Left) Patient reports pain as mild.   Patient is well, and has had no acute complaints or problems Denies any CP, SOB, ABD pain. We will continue therapy today.  Plan is to go Home after hospital stay.  Objective: Vital signs in last 24 hours: Temp:  [97 F (36.1 C)-99.6 F (37.6 C)] 98.2 F (36.8 C) (11/11 0444) Pulse Rate:  [48-110] 105 (11/11 0444) Resp:  [13-23] 17 (11/11 0444) BP: (114-160)/(59-82) 160/82 (11/11 0444) SpO2:  [99 %-100 %] 100 % (11/11 0444)  Intake/Output from previous day: 11/10 0701 - 11/11 0700 In: 2507.2 [P.O.:240; I.V.:1917.2; IV Piggyback:350] Out: 1425 [Urine:1175; Drains:250] Intake/Output this shift: No intake/output data recorded.  Recent Labs    11/09/21 1204 11/10/21 0529  HGB 10.9* 10.0*   Recent Labs    11/09/21 1204 11/10/21 0529  WBC 7.5 6.3  RBC 4.07 3.75*  HCT 35.0* 31.7*  PLT 199 187   Recent Labs    11/09/21 1204 11/10/21 0529  NA  --  136  K  --  3.8  CL  --  108  CO2  --  23  BUN  --  13  CREATININE 0.87 0.93  GLUCOSE  --  124*  CALCIUM  --  8.3*   No results for input(s): LABPT, INR in the last 72 hours.  EXAM General - Patient is Alert, Appropriate, and Oriented Extremity - Neurovascular intact Sensation intact distally Intact pulses distally Dorsiflexion/Plantar flexion intact Dressing - dressing C/D/I and no drainage, Praveena intact with 250 cc of bloody drainage Motor Function - intact, moving foot and toes well on exam.   Past Medical History:  Diagnosis Date   Allergy    Arthritis    Eczema    H/O gastric bypass    Heart murmur    since birth   Persistent headaches    Pre-diabetes    Vitamin D deficiency     Assessment/Plan:   1 Day Post-Op Procedure(s) (LRB): TOTAL KNEE ARTHROPLASTY (Left) Active Problems:   S/P TKR (total knee replacement) using cement, left  Estimated body mass index is 45.37 kg/m as  calculated from the following:   Height as of this encounter: 5\' 6"  (1.676 m).   Weight as of this encounter: 127.5 kg. Advance diet Up with therapy Work on bowel movement Pain well controlled Vital signs stable, heart rate slightly elevated continue to monitor. Labs are stable Care management to assist with discharge to home with home health PT likely Sunday pending completion of PT goals  DVT Prophylaxis - Lovenox, Foot Pumps, and TED hose Weight-Bearing as tolerated to left leg   T. Tuesday, PA-C [facility] 11/10/2021, 7:39 AM

## 2021-11-10 NOTE — Progress Notes (Signed)
Occupational Therapy Treatment Patient Details Name: Monique Patton MRN: [id number] DOB: [birth year] Today's Date: 11/10/2021   History of present illness Pt s/p TKA by Dr. Rudene Christians on 11/09/21. Pt has signficant medical history of arthritis, migraines and obesity. Pt has never had reports of blood clots in her history.   OT comments  Pt seen for OT tx this date to f/u re: safety with ADLs/ADL mobility. Pt requires MIN A for ADL transfers and CGA for fxl mobility with RW. OT offers cues and education re: safe hand placement with RW. Pt somewhat pain limited with fxl mobility after toileting. She requires cues for safe sequencing of hand placement with grab bars and toilet rails. OT engages pt in fxl mobility back to chair (downgraded task by placing chair closer to bathroom door as pt with increased c/o dizziness as well, BP checked and stable). Pt requires MOD A for seated LB ADLs with ed re: use of AE including demonstration. Pt left in chair with all needs met and in reach. Will continue to follow.   Recommendations for follow up therapy are one component of a multi-disciplinary discharge planning process, led by the attending physician.  Recommendations may be updated based on patient status, additional functional criteria and insurance authorization.    Follow Up Recommendations  Home health OT    Assistance Recommended at Discharge Frequent or constant Supervision/Assistance  Equipment Recommendations  BSC/3in1;Tub/shower seat;Other (comment) (2ww)    Recommendations for Other Services      Precautions / Restrictions Precautions Precautions: Knee Restrictions Weight Bearing Restrictions: Yes LLE Weight Bearing: Weight bearing as tolerated       Mobility Bed Mobility               General bed mobility comments: up with PT when OT presents    Transfers Overall transfer level: Needs assistance Equipment used: Rolling walker (2 wheels) Transfers: Sit to/from Stand Sit to  Stand: Min assist;From elevated surface           General transfer comment: MIN A to CTS, cues for hand placement     Balance Overall balance assessment: Needs assistance Sitting-balance support: Feet supported Sitting balance-Leahy Scale: Good       Standing balance-Leahy Scale: Fair Standing balance comment: UE support for static stand and fxl mobility, unable to alternate hands from UE support d/t post-op pain                           ADL either performed or assessed with clinical judgement   ADL Overall ADL's : Needs assistance/impaired                     Lower Body Dressing: Moderate assistance;Sitting/lateral leans;With adaptive equipment Lower Body Dressing Details (indicate cue type and reason): education and demonstration re: how to use AE for LBDsg Toilet Transfer: Ambulation;Rolling walker (2 wheels);Min guard;Grab bars;BSC/3in1 Toilet Transfer Details (indicate cue type and reason): BSC to elevate toilet in restroom. cues for hand placement with grab bar/toilet rails Toileting- Clothing Manipulation and Hygiene: Set up;Sitting/lateral lean       Functional mobility during ADLs: Min guard;Rolling walker (2 wheels) (ambulation from restroom with cues for pacing and safety, one seated rest break as pt is noted to haev some dizziness with first stand from commode. BP okay)      Extremity/Trunk Assessment              Vision  Perception     Praxis      Cognition Arousal/Alertness: Awake/alert Behavior During Therapy: WFL for tasks assessed/performed Overall Cognitive Status: Within Functional Limits for tasks assessed                                            Exercises Other Exercises Other Exercises: OT engages pt in commode transfer, LB dressing with adaptive equipment, cues to sequence   Shoulder Instructions       General Comments      Pertinent Vitals/ Pain       Pain Assessment: 0-10 Pain  Score: 7  Pain Location: left knee Pain Descriptors / Indicators: Aching Pain Intervention(s): Limited activity within patient's tolerance;Monitored during session;Premedicated before session  Home Living                                          Prior Functioning/Environment              Frequency  Min 2X/week        Progress Toward Goals  OT Goals(current goals can now be found in the care plan section)  Progress towards OT goals: Progressing toward goals  Acute Rehab OT Goals Patient Stated Goal: to manage pain and be able to move more OT Goal Formulation: With patient Time For Goal Achievement: 11/23/21 Potential to Achieve Goals: Good  Plan Discharge plan remains appropriate    Co-evaluation                 AM-PAC OT "6 Clicks" Daily Activity     Outcome Measure   Help from another person eating meals?: None Help from another person taking care of personal grooming?: A Little Help from another person toileting, which includes using toliet, bedpan, or urinal?: A Lot Help from another person bathing (including washing, rinsing, drying)?: A Lot Help from another person to put on and taking off regular upper body clothing?: A Little Help from another person to put on and taking off regular lower body clothing?: A Lot 6 Click Score: 16    End of Session Equipment Utilized During Treatment: Gait belt;Rolling walker (2 wheels)  OT Visit Diagnosis: Muscle weakness (generalized) (M62.81);Pain Pain - Right/Left: Left Pain - part of body: Knee   Activity Tolerance Patient limited by pain   Patient Left with call bell/phone within reach;in chair;with chair alarm set   Nurse Communication Mobility status        Time: 1740-8144 OT Time Calculation (min): 39 min  Charges: OT General Charges $OT Visit: 1 Visit OT Treatments $Self Care/Home Management : 23-37 mins $Therapeutic Activity: 8-22 mins  Gerrianne Scale, MS, OTR/L ascom  [phone] 11/10/21, 3:12 PM

## 2021-11-10 NOTE — Progress Notes (Addendum)
PT Cancellation Note  Patient Details Name: Monique Patton MRN: [id number] DOB: 12-30-1965   Cancelled Treatment:     Author returned for PM session however pt just had ambulated to BR with spouse. Requested to hold PM session. Pt is progressing with overall improved pain and mobility this date. She is progressing well overall. Plan for DC home with HHPT seems appropriate. Anticipate improvements with therapy next date. Author will see pt BID tomorrow and continue to progress pt for planned DC home on 11/13.   Rushie Chestnut 11/10/2021, 7:47 PM

## 2021-11-10 NOTE — Progress Notes (Signed)
Physical Therapy Treatment Patient Details Name: Monique Patton MRN: [id number] DOB: [birth year] Today's Date: 11/10/2021   History of Present Illness Pt s/p TKA by Dr. Rosita Kea on 11/09/21. Pt has signficant medical history of arthritis, migraines and obesity. Pt has never had reports of blood clots in her history.    PT Comments    Pt was long sitting in bed with RN in room giving tylenol. She agrees to session even with endorsing 7/10 pain at rest. Overall continues to be pain limited more so than strength. She required mod assist to exit R side of bed with author assisting LLE to EOB. Demonstrated strength to perform without assistance but pain continued to slow progress. She stood and ambulated to BR with RW with very slow, step to, antalgic gait pattern. No LOB or safety concern. Will return for afternoon PM session to progress strength, ROM and improve gait distances.    Recommendations for follow up therapy are one component of a multi-disciplinary discharge planning process, led by the attending physician.  Recommendations may be updated based on patient status, additional functional criteria and insurance authorization.  Follow Up Recommendations  Home health PT     Assistance Recommended at Discharge Intermittent Supervision/Assistance  Equipment Recommendations  Rolling walker (2 wheels);BSC/3in1       Precautions / Restrictions Precautions Precautions: Knee Precaution Booklet Issued: No Restrictions Weight Bearing Restrictions: Yes LLE Weight Bearing: Weight bearing as tolerated     Mobility  Bed Mobility Overal bed mobility: Needs Assistance Bed Mobility: Supine to Sit     Supine to sit: Mod assist     General bed mobility comments: Pt continues to require mod assist to exit R side of bed with assistance required progressing LLE. pain limiting more so than strength. HOB was elevated    Transfers Overall transfer level: Needs assistance Equipment used: Rolling  walker (2 wheels) Transfers: Sit to/from Stand Sit to Stand: Min assist;From elevated surface           General transfer comment: Pt required min assist to stand with vcs for improved technique and safety    Ambulation/Gait Ambulation/Gait assistance: Min guard Gait Distance (Feet): 20 Feet Assistive device: Rolling walker (2 wheels) Gait Pattern/deviations: Step-to pattern;Antalgic;Trunk flexed;Shuffle Gait velocity: decreased     General Gait Details: Pt was able to ambulate to BR with slow antalgic step to pattern       Cognition Arousal/Alertness: Awake/alert Behavior During Therapy: WFL for tasks assessed/performed Overall Cognitive Status: Within Functional Limits for tasks assessed                Pertinent Vitals/Pain Pain Assessment: 0-10 Pain Score: 4  Pain Location: left knee Pain Descriptors / Indicators: Aching Pain Intervention(s): Limited activity within patient's tolerance;Monitored during session;Premedicated before session;Repositioned     PT Goals (current goals can now be found in the care plan section) Acute Rehab PT Goals Patient Stated Goal: Less pain Progress towards PT goals: Progressing toward goals    Frequency    BID      PT Plan Current plan remains appropriate       AM-PAC PT "6 Clicks" Mobility   Outcome Measure  Help needed turning from your back to your side while in a flat bed without using bedrails?: A Little Help needed moving from lying on your back to sitting on the side of a flat bed without using bedrails?: A Lot Help needed moving to and from a bed to a chair (including a wheelchair)?: A  Little Help needed standing up from a chair using your arms (e.g., wheelchair or bedside chair)?: A Little Help needed to walk in hospital room?: A Little Help needed climbing 3-5 steps with a railing? : A Lot 6 Click Score: 16    End of Session Equipment Utilized During Treatment: Gait belt Activity Tolerance: Patient  tolerated treatment well;Patient limited by pain Patient left: with call bell/phone within reach;with bed alarm set;with family/visitor present Nurse Communication: Mobility status PT Visit Diagnosis: Other abnormalities of gait and mobility (R26.89);Muscle weakness (generalized) (M62.81);Unsteadiness on feet (R26.81)     Time: 3300-7622 PT Time Calculation (min) (ACUTE ONLY): 23 min  Charges:  $Gait Training: 8-22 mins $Therapeutic Activity: 8-22 mins                     Jetta Lout PTA 11/10/21, 9:34 AM

## 2021-11-11 NOTE — Progress Notes (Signed)
Taken by Angus Amini 

## 2021-11-11 NOTE — Progress Notes (Signed)
  Subjective: 2 Days Post-Op Procedure(s) (LRB): TOTAL KNEE ARTHROPLASTY (Left) Patient reports pain as moderate.   Patient is well, and has had no acute complaints or problems Plan is to go Home after hospital stay. Negative for chest pain and shortness of breath Fever: no Gastrointestinal: negative for nausea and vomiting.  Patient has not had a bowel movement.  Objective: Vital signs in last 24 hours: Temp:  [98.4 F (36.9 C)-99.4 F (37.4 C)] 98.7 F (37.1 C) (11/12 0751) Pulse Rate:  [93-108] 108 (11/12 0751) Resp:  [15-20] 15 (11/12 0751) BP: (123-149)/(63-77) 140/75 (11/12 0751) SpO2:  [99 %-100 %] 100 % (11/12 0751)  Intake/Output from previous day:  Intake/Output Summary (Last 24 hours) at 11/11/2021 1014 Last data filed at 11/10/2021 1326 Gross per 24 hour  Intake 240 ml  Output --  Net 240 ml    Intake/Output this shift: No intake/output data recorded.  Labs: Recent Labs    11/09/21 1204 11/10/21 0529  HGB 10.9* 10.0*   Recent Labs    11/09/21 1204 11/10/21 0529  WBC 7.5 6.3  RBC 4.07 3.75*  HCT 35.0* 31.7*  PLT 199 187   Recent Labs    11/09/21 1204 11/10/21 0529  NA  --  136  K  --  3.8  CL  --  108  CO2  --  23  BUN  --  13  CREATININE 0.87 0.93  GLUCOSE  --  124*  CALCIUM  --  8.3*   No results for input(s): LABPT, INR in the last 72 hours.   EXAM General - Patient is Alert, Appropriate, and Oriented Extremity - Neurovascular intact Dorsiflexion/Plantar flexion intact Compartment soft Dressing/Incision -Prevena in place, ~200cc drainage in cannister Motor Function - intact, moving foot and toes well on exam.       Assessment/Plan: 2 Days Post-Op Procedure(s) (LRB): TOTAL KNEE ARTHROPLASTY (Left) Active Problems:   S/P TKR (total knee replacement) using cement, left  Estimated body mass index is 45.37 kg/m as calculated from the following:   Height as of this encounter: 5\' 6"  (1.676 m).   Weight as of this encounter:  127.5 kg. Advance diet Up with therapy  Anticipate d/c tomorrow pending completion of PT goals      DVT Prophylaxis - Lovenox, Ted hose, and foot pumps Weight-Bearing as tolerated to left leg  , PA-C [facility] 11/11/2021, 10:14 AM

## 2021-11-11 NOTE — Progress Notes (Signed)
Physical Therapy Treatment Patient Details Name: ELOWYN Patton MRN: [id number] DOB: February 22, 1965 Today's Date: 11/11/2021   History of Present Illness Pt s/p TKA by Dr. Rosita Kea on 11/09/21. Pt has signficant medical history of arthritis, migraines and obesity. Pt has never had reports of blood clots in her history.    PT Comments    Pt was long sitting in bed upon arriving. She was premedicated prior to session and is cooperative and pleasant throughout. She states pain is a little improved from previous date. Does endorse 7/10 pain in wt bearing activity but overall pain did not limit session progression. She does still require min assist to exit bed and return to bed. Was educated on techniques to improve bed mobility. She has a bed at home that elevates. Once seated EOB pt only required CGA for standing to RW. Bed height was elevated to simulate home environment. She ambulated to rehab gym, performed ascending/descending stairs without rails. Performed backwards technique with RW + step to pattern. No LOB or safety concerns throughout session. She was able to tolerate several ROM exercises and demonstrated AAROM 3-81 degrees. Author will return for PM session to continue to promote strengthening and ROM improvements. Pt is planning to DC home tomorrow with HHPT to follow.    Recommendations for follow up therapy are one component of a multi-disciplinary discharge planning process, led by the attending physician.  Recommendations may be updated based on patient status, additional functional criteria and insurance authorization.  Follow Up Recommendations  Home health PT     Assistance Recommended at Discharge Intermittent Supervision/Assistance  Equipment Recommendations  Rolling walker (2 wheels);BSC/3in1       Precautions / Restrictions Precautions Precautions: Knee Precaution Booklet Issued: Yes (comment) Restrictions Weight Bearing Restrictions: Yes LLE Weight Bearing: Weight bearing as  tolerated     Mobility  Bed Mobility Overal bed mobility: Needs Assistance Bed Mobility: Supine to Sit;Sit to Supine     Supine to sit: Min assist Sit to supine: Min assist   General bed mobility comments: educated on using sheet/dog leash/ ot spouse assistance for getting into/out of bed. HOB was elevated. pt's bed at home has this function    Transfers Overall transfer level: Needs assistance Equipment used: Rolling walker (2 wheels) Transfers: Sit to/from Stand Sit to Stand: Min guard           General transfer comment: CGA for safety. Vcs for imporved technique but overall no lifting assistance required. Did perform from elevated bed &mat table (in gym) height    Ambulation/Gait Ambulation/Gait assistance: Supervision Gait Distance (Feet): 120 Feet Assistive device: Rolling walker (2 wheels) Gait Pattern/deviations: Step-to pattern;Antalgic Gait velocity: decreased     General Gait Details: pt demonstarted much imporved gait abilities with increased cadence and overall much more fluid/safe   Stairs Stairs: Yes Stairs assistance: Min guard Stair Management: No rails;Backwards;Step to pattern;With walker Number of Stairs: 4 General stair comments: Pt demonstrated safe ability to ambulate up/down 4 stair without use of rails. no safety concerns noted.      Balance Overall balance assessment: Needs assistance Sitting-balance support: Feet supported Sitting balance-Leahy Scale: Good     Standing balance support: Bilateral upper extremity supported;During functional activity;Reliant on assistive device for balance Standing balance-Leahy Scale: Good Standing balance comment: no LOB or unsteadiness with BUE support on RW       Cognition Arousal/Alertness: Awake/alert Behavior During Therapy: WFL for tasks assessed/performed Overall Cognitive Status: Within Functional Limits for tasks assessed  General Comments: Pt is A and O x 4. cooperative and  pleasant        Exercises Total Joint Exercises Knee Flexion: AROM;AAROM;5 reps;Seated Goniometric ROM: 3-81 degrees        Pertinent Vitals/Pain Pain Assessment: 0-10 Pain Score: 7  (in wt bearing) Pain Location: left knee Pain Descriptors / Indicators: Aching Pain Intervention(s): Limited activity within patient's tolerance;Monitored during session;Premedicated before session;Repositioned;Ice applied     PT Goals (current goals can now be found in the care plan section) Acute Rehab PT Goals Patient Stated Goal: Less pain Progress towards PT goals: Progressing toward goals    Frequency    BID      PT Plan Current plan remains appropriate       AM-PAC PT "6 Clicks" Mobility   Outcome Measure  Help needed turning from your back to your side while in a flat bed without using bedrails?: A Little Help needed moving from lying on your back to sitting on the side of a flat bed without using bedrails?: A Little Help needed moving to and from a bed to a chair (including a wheelchair)?: A Little Help needed standing up from a chair using your arms (e.g., wheelchair or bedside chair)?: A Little Help needed to walk in hospital room?: A Little Help needed climbing 3-5 steps with a railing? : A Little 6 Click Score: 18    End of Session Equipment Utilized During Treatment: Gait belt Activity Tolerance: Patient tolerated treatment well Patient left: in bed;with call bell/phone within reach;with bed alarm set;with SCD's reapplied;Other (comment) (does not like recliner, is more comfortable in bed with towel roll in place and polar care+ SCDs running) Nurse Communication: Mobility status PT Visit Diagnosis: Other abnormalities of gait and mobility (R26.89);Muscle weakness (generalized) (M62.81);Unsteadiness on feet (R26.81)     Time: 1610-9604 PT Time Calculation (min) (ACUTE ONLY): 29 min  Charges:  $Gait Training: 23-37 mins                    Jetta Lout  PTA 11/11/21, 10:50 AM

## 2021-11-11 NOTE — Progress Notes (Signed)
Physical Therapy Treatment Patient Details Name: Monique Patton MRN: [id number] DOB: 11-23-1965 Today's Date: 11/11/2021   History of Present Illness Pt s/p TKA by Dr. Rosita Kea on 11/09/21. Pt has signficant medical history of arthritis, migraines and obesity. Pt has never had reports of blood clots in her history.    PT Comments    Author returned for PM session. Pt states," I just got back into bed form going to BR (with spouse). Can we just do exercises in bed?" Author reviewed and pt performed there ex handout. See exercises listed below. Chartered loss adjuster took spouse to rehab gym to simulate going up/down stairs. Pt had safely completed in AM session. She has cleared all acute PT goals for safe DC home. Plan is to DC home tomorrow with HHPT to follow. Awaiting RW/ BSC to be delivered still. PT will return tomorrow for one more session prior to DC. At conclusion of session, pt was in bed with polar care applied, towel roll in place and call bell in reach.    Recommendations for follow up therapy are one component of a multi-disciplinary discharge planning process, led by the attending physician.  Recommendations may be updated based on patient status, additional functional criteria and insurance authorization.  Follow Up Recommendations  Home health PT     Assistance Recommended at Discharge Intermittent Supervision/Assistance  Equipment Recommendations  Rolling walker (2 wheels);BSC/3in1       Precautions / Restrictions Precautions Precautions: Knee Precaution Booklet Issued: Yes (comment) Restrictions Weight Bearing Restrictions: Yes LLE Weight Bearing: Weight bearing as tolerated     Mobility  Bed Mobility    General bed mobility comments: pt had just returned to bed after going to BR. Requested to only perform ther ex handout together. Author did go and demonstrate proper technique for performing stairs. Both pt and spouse feel confident in abilities to enter/exit home.       Balance  Overall balance assessment: Needs assistance Sitting-balance support: Feet supported Sitting balance-Leahy Scale: Good     Standing balance support: Bilateral upper extremity supported;During functional activity;Reliant on assistive device for balance Standing balance-Leahy Scale: Good Standing balance comment: no LOB or unsteadiness with BUE support on RW        Cognition Arousal/Alertness: Awake/alert Behavior During Therapy: WFL for tasks assessed/performed Overall Cognitive Status: Within Functional Limits for tasks assessed      General Comments: Pt is A and O x 4. cooperative and pleasant        Exercises Total Joint Exercises Ankle Circles/Pumps: AROM;10 reps Quad Sets: AROM;10 reps Gluteal Sets: AROM;10 reps Heel Slides: AROM;10 reps Hip ABduction/ADduction: AROM;10 reps Straight Leg Raises: AAROM;10 reps        Pertinent Vitals/Pain Pain Assessment: 0-10 Pain Score: 4  Pain Location: left knee Pain Descriptors / Indicators: Aching Pain Intervention(s): Limited activity within patient's tolerance;Monitored during session;Premedicated before session;Repositioned;Ice applied     PT Goals (current goals can now be found in the care plan section) Acute Rehab PT Goals Patient Stated Goal: go home Progress towards PT goals: Progressing toward goals    Frequency    BID      PT Plan Current plan remains appropriate       AM-PAC PT "6 Clicks" Mobility   Outcome Measure  Help needed turning from your back to your side while in a flat bed without using bedrails?: A Little Help needed moving from lying on your back to sitting on the side of a flat bed without using bedrails?: A  Little Help needed moving to and from a bed to a chair (including a wheelchair)?: A Little Help needed standing up from a chair using your arms (e.g., wheelchair or bedside chair)?: A Little Help needed to walk in hospital room?: A Little Help needed climbing 3-5 steps with a  railing? : A Little 6 Click Score: 18    End of Session   Activity Tolerance: Patient tolerated treatment well;Patient limited by pain Patient left: in bed;with call bell/phone within reach;with bed alarm set;with SCD's reapplied;Other (comment) Nurse Communication: Mobility status PT Visit Diagnosis: Other abnormalities of gait and mobility (R26.89);Muscle weakness (generalized) (M62.81);Unsteadiness on feet (R26.81)     Time: 2671-2458 PT Time Calculation (min) (ACUTE ONLY): 20 min  Charges:  $Therapeutic Exercise: 8-22 mins                     Jetta Lout PTA 11/11/21, 3:51 PM

## 2021-11-11 NOTE — TOC Progression Note (Signed)
Transition of Care [facility]) - Progression Note    Patient Details  Name: Monique Patton MRN: [id number] Date of Birth: 02-14-1965  Transition of Care [facility]) CM/SW Contact  Caryn Section, RN Phone Number: 11/11/2021, 4:14 PM  Clinical Narrative:   patient set up with [facility].  Notified adapt that patient is expected to discharge tomorrow, ordered rolling walker and bsc today. Orders in system..         Expected Discharge Plan and Services                                                 Social Determinants of Health (SDOH) Interventions    Readmission Risk Interventions No flowsheet data found.

## 2021-11-11 NOTE — Plan of Care (Signed)

## 2021-11-12 ENCOUNTER — Encounter: Payer: Self-pay | Admitting: Orthopedic Surgery

## 2021-11-12 NOTE — Progress Notes (Signed)
Blood pressure 129/63, pulse 98, temperature 98.3 F (36.8 C), resp. rate 18, height 5\' 6"  (1.676 m), weight 127.5 kg, SpO2 100 %. IV removed site c/d/I. Prevena placed by PA, pt went home with extra dressing, bone foam and polar care. Pt went home with all scripts and paper work. Discussed d/c packet at bedside with pt and husband where they verbalized understanding. DME was delivered to the room and pt transported via w/c with all belongings down to private car.

## 2021-11-12 NOTE — Progress Notes (Signed)
  Subjective: 3 Days Post-Op Procedure(s) (LRB): TOTAL KNEE ARTHROPLASTY (Left) Patient reports pain as moderate.   Patient is well, and has had no acute complaints or problems Plan is to go Home after hospital stay. Negative for chest pain and shortness of breath Fever: no Gastrointestinal: negative for nausea and vomiting.  Patient has had a bowel movement.  Objective: Vital signs in last 24 hours: Temp:  [98.6 F (37 C)-99.3 F (37.4 C)] 99.3 F (37.4 C) (11/13 0816) Pulse Rate:  [101-106] 105 (11/13 0816) Resp:  [16-20] 20 (11/13 0816) BP: (124-141)/(71-88) 141/88 (11/13 0816) SpO2:  [99 %-100 %] 100 % (11/13 0816)  Intake/Output from previous day: No intake or output data in the 24 hours ending 11/12/21 1047  Intake/Output this shift: No intake/output data recorded.  Labs: Recent Labs    11/09/21 1204 11/10/21 0529  HGB 10.9* 10.0*   Recent Labs    11/09/21 1204 11/10/21 0529  WBC 7.5 6.3  RBC 4.07 3.75*  HCT 35.0* 31.7*  PLT 199 187   Recent Labs    11/09/21 1204 11/10/21 0529  NA  --  136  K  --  3.8  CL  --  108  CO2  --  23  BUN  --  13  CREATININE 0.87 0.93  GLUCOSE  --  124*  CALCIUM  --  8.3*   No results for input(s): LABPT, INR in the last 72 hours.   EXAM General - Patient is Alert, Appropriate, and Oriented Extremity - Neurovascular intact Dorsiflexion/Plantar flexion intact Compartment soft Dressing/Incision -Prevena in place, 200 mL bloody drainage in the cannister Motor Function - intact, moving foot and toes well on exam.    Cardiovascular-  tachy rate, regular rhythm Respiratory- Lungs clear to auscultation bilaterally Gastrointestinal- soft, nontender, and active bowel sounds   Assessment/Plan: 3 Days Post-Op Procedure(s) (LRB): TOTAL KNEE ARTHROPLASTY (Left) Active Problems:   S/P TKR (total knee replacement) using cement, left  Estimated body mass index is 45.37 kg/m as calculated from the following:   Height as of  this encounter: 5\' 6"  (1.676 m).   Weight as of this encounter: 127.5 kg. Advance diet Up with therapy Discharge home with home health  Prevena changed to portable unit.      DVT Prophylaxis - Lovenox, Ted hose, and foot pumps Weight-Bearing as tolerated to left leg  , PA-C [facility] 11/12/2021, 10:47 AM

## 2021-11-12 NOTE — Progress Notes (Signed)
Physical Therapy Treatment Patient Details Name: Monique Patton MRN: [id number] DOB: [birth year] Today's Date: 11/12/2021   History of Present Illness Pt s/p TKA by Dr. Rosita Kea on 11/09/21. Pt has signficant medical history of arthritis, migraines and obesity. Pt has never had reports of blood clots in her history.    PT Comments    Participated in exercises as described below.  Completed lap on unit.  Progressing well towards goal.  Discharge anticipated today with no further questions or concerns voiced by pt.  Discussed HEP and general mobility in anticipation of discharge.   Recommendations for follow up therapy are one component of a multi-disciplinary discharge planning process, led by the attending physician.  Recommendations may be updated based on patient status, additional functional criteria and insurance authorization.  Follow Up Recommendations  Home health PT     Assistance Recommended at Discharge Intermittent Supervision/Assistance  Equipment Recommendations  Rolling walker (2 wheels);BSC/3in1    Recommendations for Other Services       Precautions / Restrictions Precautions Precautions: Knee Precaution Booklet Issued: Yes (comment) Restrictions Weight Bearing Restrictions: Yes RLE Weight Bearing: Weight bearing as tolerated LLE Weight Bearing: Weight bearing as tolerated     Mobility  Bed Mobility Overal bed mobility: Needs Assistance Bed Mobility: Supine to Sit;Sit to Supine     Supine to sit: Min assist Sit to supine: Min assist        Transfers Overall transfer level: Needs assistance Equipment used: Rolling walker (2 wheels) Transfers: Sit to/from Stand Sit to Stand: Supervision                Ambulation/Gait Ambulation/Gait assistance: Supervision Gait Distance (Feet): 160 Feet Assistive device: Rolling walker (2 wheels) Gait Pattern/deviations: Step-to pattern;Antalgic Gait velocity: decreased         Stairs          General stair comments: declined.  felt comfortable with training yesterday   Wheelchair Mobility    Modified Rankin (Stroke Patients Only)       Balance Overall balance assessment: Modified Independent                                          Cognition Arousal/Alertness: Awake/alert Behavior During Therapy: WFL for tasks assessed/performed Overall Cognitive Status: Within Functional Limits for tasks assessed                                 General Comments: Pt is A and O x 4. cooperative and pleasant        Exercises Total Joint Exercises Ankle Circles/Pumps: AROM;10 reps Quad Sets: AROM;10 reps Gluteal Sets: AROM;10 reps Heel Slides: AROM;10 reps Hip ABduction/ADduction: AROM;10 reps Straight Leg Raises: AAROM;10 reps Goniometric ROM: 3-85    General Comments        Pertinent Vitals/Pain Pain Assessment: Faces Faces Pain Scale: Hurts even more Pain Location: left knee Pain Descriptors / Indicators: Aching Pain Intervention(s): Limited activity within patient's tolerance;Monitored during session;Repositioned;Ice applied    Home Living                          Prior Function            PT Goals (current goals can now be found in the care plan section) Progress towards PT goals: Progressing toward  goals    Frequency    BID      PT Plan Current plan remains appropriate    Co-evaluation              AM-PAC PT "6 Clicks" Mobility   Outcome Measure  Help needed turning from your back to your side while in a flat bed without using bedrails?: A Little Help needed moving from lying on your back to sitting on the side of a flat bed without using bedrails?: A Little Help needed moving to and from a bed to a chair (including a wheelchair)?: None Help needed standing up from a chair using your arms (e.g., wheelchair or bedside chair)?: None Help needed to walk in hospital room?: A Little Help needed  climbing 3-5 steps with a railing? : A Little 6 Click Score: 20    End of Session   Activity Tolerance: Patient tolerated treatment well;Patient limited by pain Patient left: in bed;with call bell/phone within reach;with bed alarm set;with SCD's reapplied;Other (comment) Nurse Communication: Mobility status PT Visit Diagnosis: Other abnormalities of gait and mobility (R26.89);Muscle weakness (generalized) (M62.81);Unsteadiness on feet (R26.81)     Time: 0352-4818 PT Time Calculation (min) (ACUTE ONLY): 27 min  Charges:  $Gait Training: 8-22 mins $Therapeutic Exercise: 8-22 mins                    Danielle Dess, PTA 11/12/21, 9:05 AM

## 2022-01-05 ENCOUNTER — Encounter: Payer: Self-pay | Admitting: Podiatry

## 2022-01-05 ENCOUNTER — Ambulatory Visit: Payer: BC Managed Care – PPO | Admitting: Podiatry

## 2022-01-05 ENCOUNTER — Other Ambulatory Visit: Payer: Self-pay

## 2022-01-05 DIAGNOSIS — L989 Disorder of the skin and subcutaneous tissue, unspecified: Secondary | ICD-10-CM | POA: Diagnosis not present

## 2022-01-05 DIAGNOSIS — M79675 Pain in left toe(s): Secondary | ICD-10-CM

## 2022-01-05 DIAGNOSIS — B351 Tinea unguium: Secondary | ICD-10-CM | POA: Diagnosis not present

## 2022-01-05 MED ORDER — GENTAMICIN SULFATE 0.1 % EX CREA: 1.0000 "application " | TOPICAL_CREAM | Freq: Two times a day (BID) | CUTANEOUS | 1 refills | Status: DC

## 2022-01-05 NOTE — Patient Instructions (Signed)

## 2022-01-05 NOTE — Progress Notes (Signed)
HPI: 57 y.o. female presenting today for evaluation of recurrence of pain associated to the toenail plate to the left second toe.  Patient had the nail avulsed temporarily earlier in 2022 here in the office.  Patient states that when the toenail was removed he felt much better however slowly over time the toenail has regrown and is thick and painful and dystrophic.  She would like to have it permanently removed today  Patient also states that she has a recent history of knee replacement left lower extremity.  Patient states that since surgery she has developed calluses to the plantar aspect of the left great toe.  She would like to have them evaluated.  The very hard and uncomfortable.  She has not done anything for treatment  Past Medical History:  Diagnosis Date   Allergy    Arthritis    Eczema    H/O gastric bypass    Heart murmur    since birth   Persistent headaches    Pre-diabetes    Vitamin D deficiency     Past Surgical History:  Procedure Laterality Date   ABDOMINAL HYSTERECTOMY     ADENIDECTOMY     ANTERIOR CERVICAL DECOMP/DISCECTOMY FUSION  03/13/2012   Procedure: ANTERIOR CERVICAL DECOMPRESSION/DISCECTOMY FUSION 2 LEVELS;  Surgeon: Hewitt Shorts, MD;  Location: [facility];  Service: Neurosurgery;  Laterality: N/A;  Cervical Five-Six Cervical Six-Seven Anterior Cervical Decompression with fusion plating and bonegraft   COLONOSCOPY WITH PROPOFOL N/A 01/07/2018   Procedure: COLONOSCOPY WITH PROPOFOL;  Surgeon: Toledo, Boykin Nearing, MD;  Location: [facility];  Service: Gastroenterology;  Laterality: N/A;   GASTRIC BYPASS  2003   LUMBAR LAMINECTOMY/DECOMPRESSION MICRODISCECTOMY Left 05/14/2013   Procedure: LUMBAR LAMINECTOMY/DECOMPRESSION MICRODISCECTOMY 1 LEVEL;  Surgeon: Hewitt Shorts, MD;  Location: [facility];  Service: Neurosurgery;  Laterality: Left;  LEFT Lumbar four-five4 extraforaminal microdiskectomy   POSTERIOR CERVICAL FUSION/FORAMINOTOMY N/A 11/05/2013    Procedure: Cervical five-six posterior arthrodesis and instrumentation;  Surgeon: Hewitt Shorts, MD;  Location: [facility];  Service: Neurosurgery;  Laterality: N/A;  Cervical five-six posterior arthrodesis and instrumentation   TONSILLECTOMY     TOTAL KNEE ARTHROPLASTY Left 11/09/2021   Procedure: TOTAL KNEE ARTHROPLASTY;  Surgeon: Kennedy Bucker, MD;  Location: [facility];  Service: Orthopedics;  Laterality: Left;    Allergies  Allergen Reactions   Other Itching and Rash    Cat Dander   Fish Allergy Rash   Penicillins Itching and Rash   Shellfish Allergy Rash     Physical Exam: General: The patient is alert and oriented x3 in no acute distress.  Dermatology: Skin is warm, dry and supple bilateral lower extremities. Negative for open lesions or macerations.  Hyperkeratotic dystrophic nail noted to the left second toenail.  Associated tenderness to palpation there is also hyperkeratotic preulcerative callus tissue also noted to the plantar aspect of the left great toe  Vascular: Palpable pedal pulses bilaterally. Capillary refill within normal limits.  Negative for any significant edema or erythema  Neurological: Light touch and protective threshold grossly intact  Musculoskeletal Exam: No pedal deformities noted   Assessment: 1.  Pain due to onychomycotic dystrophic toenail left second digit 2.  Preulcerative callus lesions left great toe 3.  Prediabetes   Plan of Care:  1. Patient evaluated.  2.  Today we discussed the procedure of a total permanent nail avulsion to the left hallux nail plate with application of phenol.  The patient agrees and would like to have  this performed.  All possible complications and details were explained.  The toe was prepped in aseptic manner and digital block performed using 3 mL of 2% lidocaine plain.  The nail was avulsed in its entirety and 3 x 30-second application of phenol followed by an alcohol flush was performed.  Light dressing  applied with post care instructions both verbal and written 3.  Prescription for gentamicin cream applied daily 4.  Excisional debridement of the hyperkeratotic preulcerative callus tissue was performed to the left great toe 5.  Return to clinic in 3 weeks      Felecia Shelling, DPM [facility]  Dr. Felecia Shelling, DPM    [address] [place], [place] [address]                Office [phone]  Fax [phone]

## 2022-01-30 ENCOUNTER — Encounter: Payer: Self-pay | Admitting: Podiatry

## 2022-01-30 ENCOUNTER — Ambulatory Visit: Payer: BC Managed Care – PPO | Admitting: Podiatry

## 2022-01-30 ENCOUNTER — Other Ambulatory Visit: Payer: Self-pay

## 2022-01-30 DIAGNOSIS — L989 Disorder of the skin and subcutaneous tissue, unspecified: Secondary | ICD-10-CM | POA: Diagnosis not present

## 2022-01-30 DIAGNOSIS — L603 Nail dystrophy: Secondary | ICD-10-CM

## 2022-01-30 NOTE — Progress Notes (Signed)
HPI: 57 y.o. female presenting today for follow-up evaluation of a total permanent nail avulsion to the left second digit.  Patient states that she is doing very well.  She has no drainage to the area.  She has been applying the antibiotic cream as instructed.  No new complaints this time  Past Medical History:  Diagnosis Date   Allergy    Arthritis    Eczema    H/O gastric bypass    Heart murmur    since birth   Persistent headaches    Pre-diabetes    Vitamin D deficiency     Past Surgical History:  Procedure Laterality Date   ABDOMINAL HYSTERECTOMY     ADENIDECTOMY     ANTERIOR CERVICAL DECOMP/DISCECTOMY FUSION  03/13/2012   Procedure: ANTERIOR CERVICAL DECOMPRESSION/DISCECTOMY FUSION 2 LEVELS;  Surgeon: Hewitt Shorts, MD;  Location: [facility];  Service: Neurosurgery;  Laterality: N/A;  Cervical Five-Six Cervical Six-Seven Anterior Cervical Decompression with fusion plating and bonegraft   COLONOSCOPY WITH PROPOFOL N/A 01/07/2018   Procedure: COLONOSCOPY WITH PROPOFOL;  Surgeon: Toledo, Boykin Nearing, MD;  Location: [facility];  Service: Gastroenterology;  Laterality: N/A;   GASTRIC BYPASS  2003   LUMBAR LAMINECTOMY/DECOMPRESSION MICRODISCECTOMY Left 05/14/2013   Procedure: LUMBAR LAMINECTOMY/DECOMPRESSION MICRODISCECTOMY 1 LEVEL;  Surgeon: Hewitt Shorts, MD;  Location: [facility];  Service: Neurosurgery;  Laterality: Left;  LEFT Lumbar four-five4 extraforaminal microdiskectomy   POSTERIOR CERVICAL FUSION/FORAMINOTOMY N/A 11/05/2013   Procedure: Cervical five-six posterior arthrodesis and instrumentation;  Surgeon: Hewitt Shorts, MD;  Location: [facility];  Service: Neurosurgery;  Laterality: N/A;  Cervical five-six posterior arthrodesis and instrumentation   TONSILLECTOMY     TOTAL KNEE ARTHROPLASTY Left 11/09/2021   Procedure: TOTAL KNEE ARTHROPLASTY;  Surgeon: Kennedy Bucker, MD;  Location: [facility];  Service: Orthopedics;  Laterality: Left;    Allergies   Allergen Reactions   Other Itching and Rash    Cat Dander   Fish Allergy Rash   Penicillins Itching and Rash   Shellfish Allergy Rash     Physical Exam: General: The patient is alert and oriented x3 in no acute distress.  Dermatology: Absence of the toenail plate noted to the second digit left foot with good routine healing.  No drainage noted.  No erythema around the area. Hyperkeratotic preulcerative callus also noted to the plantar aspect of the IPJ left hallux  Vascular: Palpable pedal pulses bilaterally. Capillary refill within normal limits.  Negative for any significant edema or erythema  Neurological: Light touch and protective threshold grossly intact  Musculoskeletal Exam: No pedal deformities noted   Assessment: 1.  Status post total permanent nail avulsion left second digit performed 01/05/2022 with good routine healing 2.  Preulcerative callus lesion plantar aspect of the IPJ left foot   Plan of Care:  1. Patient evaluated.  2.  Light debridement of the preulcerative callus was performed to the plantar aspect of the great toe. 3.  Continue gentamicin cream daily for an additional week 4.  Return to clinic as needed      Felecia Shelling, DPM [facility]  Dr. Felecia Shelling, DPM    [address], [place] [address]  Office [phone]  Fax [phone]

## 2022-03-26 ENCOUNTER — Other Ambulatory Visit: Payer: Self-pay

## 2022-03-26 ENCOUNTER — Emergency Department
Admission: EM | Admit: 2022-03-26 | Discharge: 2022-03-26 | Disposition: A | Discharge status: Home / Self Care | Payer: BC Managed Care – PPO | Attending: Emergency Medicine | Admitting: Emergency Medicine

## 2022-03-26 DIAGNOSIS — M545 Low back pain, unspecified: Secondary | ICD-10-CM | POA: Insufficient documentation

## 2022-03-26 DIAGNOSIS — G8929 Other chronic pain: Secondary | ICD-10-CM

## 2022-03-26 MED ORDER — OXYCODONE-ACETAMINOPHEN 7.5-325 MG PO TABS
1.0000 | ORAL_TABLET | Freq: Once | ORAL | Status: AC
Start: 2022-03-26 — End: 2022-03-26
  Administered 2022-03-26: 1 via ORAL
  Filled 2022-03-26: qty 1

## 2022-03-26 MED ORDER — METHOCARBAMOL 500 MG PO TABS
1000.0000 mg | ORAL_TABLET | Freq: Once | ORAL | Status: AC
  Administered 2022-03-26: 1000 mg via ORAL
  Filled 2022-03-26: qty 2

## 2022-03-26 MED ORDER — METHOCARBAMOL 500 MG PO TABS: ORAL_TABLET | ORAL | 0 refills | Status: DC

## 2022-03-26 MED ORDER — ETODOLAC 400 MG PO TABS: 400.0000 mg | ORAL_TABLET | Freq: Two times a day (BID) | ORAL | 0 refills | Status: DC

## 2022-03-26 MED ORDER — OXYCODONE-ACETAMINOPHEN 7.5-325 MG PO TABS: 1.0000 | ORAL_TABLET | Freq: Four times a day (QID) | ORAL | 0 refills | Status: DC | PRN

## 2022-03-26 MED ORDER — KETOROLAC TROMETHAMINE 30 MG/ML IJ SOLN
30.0000 mg | Freq: Once | INTRAMUSCULAR | Status: AC
  Administered 2022-03-26: 30 mg via INTRAMUSCULAR
  Filled 2022-03-26: qty 1

## 2022-03-26 NOTE — ED Triage Notes (Signed)
C/O lower back pain shooting up toward neck since Friday. ?

## 2022-03-26 NOTE — Discharge Instructions (Addendum)
Keep your appointment with the neurosurgeon in [place] for reevaluation.  The medication was sent to your pharmacy to take until you are able to see him next Monday.  If you have any Flexeril left do not take that with this medication. ?

## 2022-03-26 NOTE — ED Notes (Signed)
Pt stated pain began during a "restraints" class with [facility] on Friday.  ?

## 2022-03-26 NOTE — ED Provider Notes (Signed)
? ?[facility] ?Provider Note ? ? ? Event Date/Time  ? First MD Initiated Contact with Patient 03/26/22 [phone]   ?  (approximate) ? ? ?History  ? ?Back Pain ? ? ?HPI ? ?Monique Patton is a 57 y.o. female   presents to the ED with complaint of back pain.  States she began experiencing low back pain over the weekend which shoots up to her neck.  Patient denies any recent injury.  Patient has a history of lumbar laminectomy and microdiscectomy in 2014 and an anterior cervical fusion and 2013.  Patient reports that she has a muscle relaxant that she usually takes 3 times a day but has not taken it in the last week.  She denies any urinary symptoms, nausea, vomiting, incontinence of bowel or bladder.  She denies any paresthesias to her lower extremities.  She does report that she is having difficulty with range of motion secondary to discomfort.  Really she rates her pain as 10/10. ? ?  ? ? ?Physical Exam  ? ?Triage Vital Signs: ?ED Triage Vitals  ?Enc Vitals Group  ?   BP 03/26/22 0807 134/79  ?   Pulse Rate 03/26/22 0807 98  ?   Resp 03/26/22 0807 16  ?   Temp 03/26/22 0807 98.6 ?F (37 ?C)  ?   Temp Source 03/26/22 0807 Oral  ?   SpO2 03/26/22 0807 97 %  ?   Weight 03/26/22 0806 281 lb 1.4 oz (127.5 kg)  ?   Height 03/26/22 0806 5\' 6"  (1.676 m)  ?   Head Circumference --   ?   Peak Flow --   ?   Pain Score 03/26/22 0806 10  ?   Pain Loc --   ?   Pain Edu? --   ?   Excl. in [place]? --   ? ? ?Most recent vital signs: ?Vitals:  ? 03/26/22 0807 03/26/22 0808  ?BP: 134/79 134/79  ?Pulse: 98 95  ?Resp: 16 17  ?Temp: 98.6 ?F (37 ?C) 98.6 ?F (37 ?C)  ?SpO2: 97% 99%  ? ? ? ?General: Awake, no distress.  ?CV:  Good peripheral perfusion.  Lungs are clear bilaterally. ?Resp:  Normal effort.  Heart regular rate and rhythm. ?Abd:  No distention.  ?Other:  Examination of the back there is no gross deformity however there is well-healed surgical scars in the lower lumbar region.  There is diffuse tenderness on palpation  of this area and bilateral paravertebral muscles.  Range of motion is slow and guarded secondary to increased pain.  Patient has generalized pain that is bilateral.  Patient is still able to bear weight and is ambulatory with minimal to no assistance. ? ? ?ED Results / Procedures / Treatments  ? ?Labs ?(all labs ordered are listed, but only abnormal results are displayed) ?Labs Reviewed - No data to display ? ? ? ?PROCEDURES: ? ?Critical Care performed:  ? ?Procedures ? ? ?MEDICATIONS ORDERED IN ED: ?Medications  ?oxyCODONE-acetaminophen (PERCOCET) 7.5-325 MG per tablet 1 tablet (1 tablet Oral Given 03/26/22 0848)  ?ketorolac (TORADOL) 30 MG/ML injection 30 mg (30 mg Intramuscular Given 03/26/22 0844)  ?methocarbamol (ROBAXIN) tablet 1,000 mg (1,000 mg Oral Given 03/26/22 0847)  ? ? ? ?IMPRESSION / MDM / ASSESSMENT AND PLAN / ED COURSE  ?I reviewed the triage vital signs and the nursing notes. ? ? ?Differential diagnosis includes, but is not limited to, lumbar strain, acute exacerbation of chronic low back pain. ? ?57 year old female  presents to the ED with complaint of low back pain that has been uncontrolled with over-the-counter medication.  Patient states that there is no radiation into her lower extremities, incontinence of bowel or bladder.  Patient has a history of a lumbar laminectomy and microdiscectomy in 2014 by Dr. Sherwood Gambler in [place].  She currently does not have any medications.  Range of motion was slow and guarded secondary to obvious muscle spasms and an exacerbation of her chronic pain.  Patient was given Toradol 30 mg IM, methocarbamol 1000 mg p.o. and Percocet 7.5 mg p.o. while in the ED.  After approximately 30 minutes she began getting some relief from this medication.  She has an appointment already scheduled with the neurosurgeon on Monday in [place].  A prescription for etodolac 400 mg twice daily with food, methocarbamol to continue every 6 hours if needed for muscle spasms and oxycodone  as needed for severe pain.  She is encouraged to use ice or heat to her back as needed. ? ? ? ?  ? ? ?FINAL CLINICAL IMPRESSION(S) / ED DIAGNOSES  ? ?Final diagnoses:  ?Acute exacerbation of chronic low back pain  ? ? ? ?Rx / DC Orders  ? ?ED Discharge Orders   ? ?      Ordered  ?  oxyCODONE-acetaminophen (PERCOCET) 7.5-325 MG tablet  Every 6 hours PRN       ? 03/26/22 0934  ?  methocarbamol (ROBAXIN) 500 MG tablet       ? 03/26/22 0934  ?  etodolac (LODINE) 400 MG tablet  2 times daily       ? 03/26/22 0934  ? ?  ?  ? ?  ? ? ? ?Note:  This document was prepared using Dragon voice recognition software and may include unintentional dictation errors. ?  ?Johnn Hai, PA-C ?03/26/22 1209 ? ?  ?Lavonia Drafts, MD ?03/26/22 1237 ? ?

## 2022-04-24 ENCOUNTER — Other Ambulatory Visit: Payer: Self-pay | Admitting: Orthopedic Surgery

## 2022-05-17 ENCOUNTER — Encounter
Admission: RE | Admit: 2022-05-17 | Discharge: 2022-05-17 | Disposition: A | Discharge status: Home / Self Care | Payer: BC Managed Care – PPO | Source: Ambulatory Visit | Attending: Orthopedic Surgery | Admitting: Orthopedic Surgery

## 2022-05-17 VITALS — BP 123/81 | HR 87 | Temp 98.0°F | Resp 14 | Ht 66.0 in | Wt 280.0 lb

## 2022-05-17 DIAGNOSIS — Z01818 Encounter for other preprocedural examination: Secondary | ICD-10-CM | POA: Insufficient documentation

## 2022-05-17 DIAGNOSIS — R8290 Unspecified abnormal findings in urine: Secondary | ICD-10-CM | POA: Diagnosis not present

## 2022-05-17 DIAGNOSIS — Z01812 Encounter for preprocedural laboratory examination: Secondary | ICD-10-CM

## 2022-05-17 HISTORY — DX: Anemia, unspecified: D64.9

## 2022-05-17 LAB — TYPE AND SCREEN
ABO/RH(D): O POS
Antibody Screen: NEGATIVE

## 2022-05-17 LAB — COMPREHENSIVE METABOLIC PANEL
ALT: 15 U/L (ref 0–44)
AST: 22 U/L (ref 15–41)
Albumin: 3.7 g/dL (ref 3.5–5.0)
Alkaline Phosphatase: 79 U/L (ref 38–126)
Anion gap: 7 (ref 5–15)
BUN: 17 mg/dL (ref 6–20)
CO2: 27 mmol/L (ref 22–32)
Calcium: 9.2 mg/dL (ref 8.9–10.3)
Chloride: 109 mmol/L (ref 98–111)
Creatinine, Ser: 0.95 mg/dL (ref 0.44–1.00)
GFR, Estimated: >60 mL/min (ref >60)
Glucose, Bld: 77 mg/dL (ref 70–99)
Potassium: 4.3 mmol/L (ref 3.5–5.1)
Sodium: 143 mmol/L (ref 135–145)
Total Bilirubin: 0.5 mg/dL (ref 0.3–1.2)
Total Protein: 6.9 g/dL (ref 6.5–8.1)

## 2022-05-17 LAB — CBC WITH DIFFERENTIAL/PLATELET
Abs Immature Granulocytes: 0.01 10*3/uL (ref 0.00–0.07)
Basophils Absolute: 0 10*3/uL (ref 0.0–0.1)
Basophils Relative: 1 %
Eosinophils Absolute: 0.2 10*3/uL (ref 0.0–0.5)
Eosinophils Relative: 4 %
HCT: 38.3 % (ref 36.0–46.0)
Hemoglobin: 11.7 g/dL — ABNORMAL LOW (ref 12.0–15.0)
Immature Granulocytes: 0 %
Lymphocytes Relative: 35 %
Lymphs Abs: 1.9 10*3/uL (ref 0.7–4.0)
MCH: 26 pg (ref 26.0–34.0)
MCHC: 30.5 g/dL (ref 30.0–36.0)
MCV: 85.1 fL (ref 80.0–100.0)
Monocytes Absolute: 0.4 10*3/uL (ref 0.1–1.0)
Monocytes Relative: 7 %
Neutro Abs: 2.9 10*3/uL (ref 1.7–7.7)
Neutrophils Relative %: 53 %
Platelets: 231 10*3/uL (ref 150–400)
RBC: 4.5 MIL/uL (ref 3.87–5.11)
RDW: 15.9 % — ABNORMAL HIGH (ref 11.5–15.5)
WBC: 5.4 10*3/uL (ref 4.0–10.5)
nRBC: 0 % (ref 0.0–0.2)

## 2022-05-17 LAB — URINALYSIS, ROUTINE W REFLEX MICROSCOPIC
Bilirubin Urine: NEGATIVE
Glucose, UA: NEGATIVE mg/dL
Ketones, ur: NEGATIVE mg/dL
Nitrite: POSITIVE — AB
Protein, ur: NEGATIVE mg/dL
Specific Gravity, Urine: 1.027 (ref 1.005–1.030)
pH: 5 (ref 5.0–8.0)

## 2022-05-17 LAB — SURGICAL PCR SCREEN
MRSA, PCR: NEGATIVE
Staphylococcus aureus: NEGATIVE

## 2022-05-17 NOTE — Patient Instructions (Addendum)
Your procedure is scheduled on: Tuesday, May 30 Report to the Registration Desk on the 1st floor of the [facility] To find out your arrival time, please call [phone] between 1PM - 3PM on: Monday, May 29 If your arrival time is 6:00 am, do not arrive prior to that time as the [facility] entrance doors do not open until 6:00 am.  REMEMBER: Instructions that are not followed completely may result in serious medical risk, up to and including death; or upon the discretion of your surgeon and anesthesiologist your surgery may need to be rescheduled.  Do not eat food after midnight the night before surgery.  No gum chewing, lozengers or hard candies.  You may however, drink CLEAR liquids up to 2 hours before you are scheduled to arrive for your surgery. Do not drink anything within 2 hours of your scheduled arrival time.  Clear liquids include: - water  - apple juice without pulp - gatorade (not RED colors) - black coffee or tea (Do NOT add milk or creamers to the coffee or tea) Do NOT drink anything that is not on this list.  In addition, your doctor has ordered for you to drink the provided  Ensure Pre-Surgery Clear Carbohydrate Drink  Drinking this carbohydrate drink up to two hours before surgery helps to reduce insulin resistance and improve patient outcomes. Please complete drinking 2 hours prior to scheduled arrival time.  DO NOT TAKE ANY MEDICATIONS THE MORNING OF SURGERY   One week prior to surgery: starting May 23 Stop Anti-inflammatories (NSAIDS) such as Advil, Aleve, Ibuprofen, Motrin, Naproxen, Naprosyn and Aspirin based products such as Excedrin, Goodys Powder, BC Powder. Stop ANY OVER THE COUNTER supplements until after surgery.  You may however, continue to take Tylenol if needed for pain up until the day of surgery.  No Alcohol for 24 hours before or after surgery.  No Smoking including e-cigarettes for 24 hours prior to surgery.  No chewable tobacco products  for at least 6 hours prior to surgery.  No nicotine patches on the day of surgery.  Do not use any "recreational" drugs for at least a week prior to your surgery.  Please be advised that the combination of cocaine and anesthesia may have negative outcomes, up to and including death. If you test positive for cocaine, your surgery will be cancelled.  On the morning of surgery brush your teeth with toothpaste and water, you may rinse your mouth with mouthwash if you wish. Do not swallow any toothpaste or mouthwash.  Use CHG Soap as directed on instruction sheet.  Do not wear jewelry, make-up, hairpins, clips or nail polish.  Do not wear lotions, powders, or perfumes.   Do not shave body from the neck down 48 hours prior to surgery just in case you cut yourself which could leave a site for infection.  Also, freshly shaved skin may become irritated if using the CHG soap.  Contact lenses, hearing aids and dentures may not be worn into surgery.  Do not bring valuables to the hospital. [facility] is not responsible for any missing/lost belongings or valuables.   Notify your doctor if there is any change in your medical condition (cold, fever, infection).  Wear comfortable clothing (specific to your surgery type) to the hospital.  After surgery, you can help prevent lung complications by doing breathing exercises.  Take deep breaths and cough every 1-2 hours. Your doctor may order a device called an Incentive Spirometer to help you take deep  breaths.  If you are being admitted to the hospital overnight, leave your suitcase in the car. After surgery it may be brought to your room.  If you are being discharged the day of surgery, you will not be allowed to drive home. You will need a responsible adult (18 years or older) to drive you home and stay with you that night.   If you are taking public transportation, you will need to have a responsible adult (18 years or older) with you. Please  confirm with your physician that it is acceptable to use public transportation.   Please call the Pre-admissions Testing Dept. at [phone] if you have any questions about these instructions.  Surgery Visitation Policy:  Patients undergoing a surgery or procedure may have two family members or support persons with them as long as the person is not COVID-19 positive or experiencing its symptoms.   Inpatient Visitation:    Visiting hours are 7 a.m. to 8 p.m. Up to four visitors are allowed at one time in a patient room, including children. The visitors may rotate out with other people during the day. One designated support person (adult) may remain overnight.

## 2022-05-20 LAB — URINE CULTURE: Culture: >=100000 — AB

## 2022-05-21 ENCOUNTER — Telehealth: Payer: Self-pay | Admitting: Urgent Care

## 2022-05-21 DIAGNOSIS — Z01812 Encounter for preprocedural laboratory examination: Secondary | ICD-10-CM

## 2022-05-21 DIAGNOSIS — N390 Urinary tract infection, site not specified: Secondary | ICD-10-CM

## 2022-05-21 MED ORDER — SULFAMETHOXAZOLE-TRIMETHOPRIM 800-160 MG PO TABS: 1.0000 | ORAL_TABLET | Freq: Two times a day (BID) | ORAL | 0 refills | Status: AC

## 2022-05-21 NOTE — Progress Notes (Signed)
[facility] Perioperative Services: Pre-Admission/Anesthesia Testing  Abnormal Lab Notification and Treatment Plan of Care   Date: 05/21/22  Name: Monique Patton MRN:   [id number]  Re: Abnormal labs noted during PAT appointment   Notified:  Provider Name Provider Role Notification Mode  Kennedy Bucker, MD Orthopedics (surgeon) Routed and/or faxed via [facility]   Abnormal Lab Value(s):   Lab Results  Component Value Date   COLORURINE YELLOW (A) 05/17/2022   APPEARANCEUR HAZY (A) 05/17/2022   LABSPEC 1.027 05/17/2022   PHURINE 5.0 05/17/2022   GLUCOSEU NEGATIVE 05/17/2022   HGBUR SMALL (A) 05/17/2022   BILIRUBINUR NEGATIVE 05/17/2022   KETONESUR NEGATIVE 05/17/2022   PROTEINUR NEGATIVE 05/17/2022   UROBILINOGEN 0.2 02/17/2021   NITRITE POSITIVE (A) 05/17/2022   LEUKOCYTESUR SMALL (A) 05/17/2022   EPIU 0-5 05/17/2022   WBCU 21-50 05/17/2022   RBCU 0-5 05/17/2022   BACTERIA RARE (A) 05/17/2022   CULT >=100,000 COLONIES/mL ESCHERICHIA COLI (A) 05/17/2022    Clinical Information and Notes:  Patient is scheduled for an elective TOTAL RIGHT KNEE ARTHROPLASTY on 05/29/2022.    UA performed in PAT consistent with/concerning for infection.  No leukocytosis noted on CBC; WBC 5400 Renal function: Estimated Creatinine Clearance: 88 mL/min (by C-G formula based on SCr of 0.95 mg/dL). Urine C&S added to assess for pathogenically significant growth.  Impression and Plan:  Monique Patton with a UA that was (+) for infection; (+) for nitrites and LE,  21-50 WBC/hpf, and bacteria. Reflex culture was sent that grew out significant GNR colony count (Escherichia coli). Contacted patient to discuss. Patient reporting that she is experiencing urinary frequency, urgency, and minor dysuria. She denies abdominal pain, back pain, and N/V/F. She has a history of recurrent UTI as per review of her records.Patient with surgery scheduled soon. In efforts to avoid delaying patient's  procedure, or have her experience any potentially significant perioperative complications related to the aforementioned, I would like to proceed with treatment for urinary tract infection.  Allergies reviewed. Culture report also reviewed to ensure culture appropriate coverage is being provided. Will treat with a 3 day course of SMZ-TMP. Patient encouraged to complete the entire course of antibiotics even if she begins to feel better.  Meds ordered this encounter  Medications   sulfamethoxazole-trimethoprim (BACTRIM DS) 800-160 MG tablet    Sig: Take 1 tablet by mouth 2 (two) times daily for 3 days. Increase WATER intake while taking this medication.    Dispense:  6 tablet    Refill:  0   Patient encouraged to increase her fluid intake as much as possible. Discussed that water is always best to flush the urinary tract. She was advised to avoid caffeine containing fluids until her infections clears, as caffeine can cause her to experience painful bladder spasms.   May use Tylenol as needed for pain/fever should she experience these symptoms.   Patient instructed to call surgeon's office or PAT with any questions or concerns related to the above outlined course of treatment. Additionally, she was instructed to call if she feels like she is getting worse overall while on treatment. Results and treatment plan of care forwarded to primary attending surgeon to make them aware.   Encounter Diagnoses  Name Primary?   Pre-operative laboratory examination Yes   E. coli UTI (urinary tract infection)     Quentin Mulling, MSN, APRN, FNP-C, CEN [facility]  Peri-operative Services Nurse Practitioner Phone: [phone] Fax: [phone] 05/21/22 10:35 AM  NOTE: This note has been prepared using Scientist, clinical (histocompatibility and immunogenetics). Despite my best ability to proofread, there is always the potential that unintentional transcriptional errors may still occur from this process.

## 2022-05-29 ENCOUNTER — Ambulatory Visit: Payer: BC Managed Care – PPO | Admitting: Certified Registered"

## 2022-05-29 ENCOUNTER — Other Ambulatory Visit: Payer: Self-pay

## 2022-05-29 ENCOUNTER — Observation Stay: Payer: BC Managed Care – PPO

## 2022-05-29 ENCOUNTER — Encounter
Admission: RE | Disposition: A | Discharge status: Home Health | Payer: Self-pay | Source: Home / Self Care | Attending: Orthopedic Surgery

## 2022-05-29 ENCOUNTER — Encounter: Payer: Self-pay | Admitting: Orthopedic Surgery

## 2022-05-29 ENCOUNTER — Observation Stay
Admission: RE | Admit: 2022-05-29 | Discharge: 2022-05-31 | Disposition: A | Discharge status: Home Health | Payer: BC Managed Care – PPO | Attending: Orthopedic Surgery | Admitting: Orthopedic Surgery

## 2022-05-29 ENCOUNTER — Ambulatory Visit: Payer: BC Managed Care – PPO | Admitting: Urgent Care

## 2022-05-29 DIAGNOSIS — Z96651 Presence of right artificial knee joint: Secondary | ICD-10-CM

## 2022-05-29 DIAGNOSIS — M1711 Unilateral primary osteoarthritis, right knee: Secondary | ICD-10-CM | POA: Diagnosis not present

## 2022-05-29 DIAGNOSIS — Z96652 Presence of left artificial knee joint: Secondary | ICD-10-CM | POA: Diagnosis not present

## 2022-05-29 DIAGNOSIS — Z79899 Other long term (current) drug therapy: Secondary | ICD-10-CM | POA: Insufficient documentation

## 2022-05-29 DIAGNOSIS — Z01812 Encounter for preprocedural laboratory examination: Secondary | ICD-10-CM

## 2022-05-29 HISTORY — PX: TOTAL KNEE ARTHROPLASTY: SHX125

## 2022-05-29 LAB — CBC
HCT: 36.5 % (ref 36.0–46.0)
Hemoglobin: 11.4 g/dL — ABNORMAL LOW (ref 12.0–15.0)
MCH: 26.2 pg (ref 26.0–34.0)
MCHC: 31.2 g/dL (ref 30.0–36.0)
MCV: 83.9 fL (ref 80.0–100.0)
Platelets: 197 10*3/uL (ref 150–400)
RBC: 4.35 MIL/uL (ref 3.87–5.11)
RDW: 15.9 % — ABNORMAL HIGH (ref 11.5–15.5)
WBC: 6.1 10*3/uL (ref 4.0–10.5)
nRBC: 0 % (ref 0.0–0.2)

## 2022-05-29 LAB — CREATININE, SERUM
Creatinine, Ser: 0.81 mg/dL (ref 0.44–1.00)
GFR, Estimated: >60 mL/min (ref >60)

## 2022-05-29 SURGERY — ARTHROPLASTY, KNEE, TOTAL
Anesthesia: Spinal | Site: Knee | Laterality: Right

## 2022-05-29 MED ORDER — SODIUM CHLORIDE 0.9 % IR SOLN: Status: DC | PRN

## 2022-05-29 MED ORDER — ONDANSETRON HCL 4 MG/2ML IJ SOLN
INTRAMUSCULAR | Status: DC | PRN
  Administered 2022-05-29: 4 mg via INTRAVENOUS

## 2022-05-29 MED ORDER — GLYCOPYRROLATE 0.2 MG/ML IJ SOLN
INTRAMUSCULAR | Status: DC | PRN
  Administered 2022-05-29: .2 mg via INTRAVENOUS

## 2022-05-29 MED ORDER — FLEET ENEMA 7-19 GM/118ML RE ENEM: 1.0000 | ENEMA | Freq: Once | RECTAL | Status: DC | PRN

## 2022-05-29 MED ORDER — ONDANSETRON HCL 4 MG/2ML IJ SOLN
INTRAMUSCULAR | Status: AC
  Filled 2022-05-29: qty 2

## 2022-05-29 MED ORDER — FENTANYL CITRATE (PF) 100 MCG/2ML IJ SOLN
INTRAMUSCULAR | Status: AC
  Filled 2022-05-29: qty 2

## 2022-05-29 MED ORDER — PHENOL 1.4 % MT LIQD: 1.0000 | OROMUCOSAL | Status: DC | PRN

## 2022-05-29 MED ORDER — CEFAZOLIN SODIUM-DEXTROSE 2-4 GM/100ML-% IV SOLN
2.0000 g | Freq: Four times a day (QID) | INTRAVENOUS | Status: AC
  Administered 2022-05-29 – 2022-05-30 (×2): 2 g via INTRAVENOUS
  Filled 2022-05-29 (×2): qty 100

## 2022-05-29 MED ORDER — VITAMIN D 25 MCG (1000 UNIT) PO TABS
5000.0000 [IU] | ORAL_TABLET | Freq: Every day | ORAL | Status: DC
  Administered 2022-05-29 – 2022-05-31 (×3): 5000 [IU] via ORAL
  Filled 2022-05-29 (×3): qty 5

## 2022-05-29 MED ORDER — ONDANSETRON HCL 4 MG/2ML IJ SOLN: 4.0000 mg | Freq: Once | INTRAMUSCULAR | Status: DC | PRN

## 2022-05-29 MED ORDER — ZOLPIDEM TARTRATE 5 MG PO TABS: 5.0000 mg | ORAL_TABLET | Freq: Every evening | ORAL | Status: DC | PRN

## 2022-05-29 MED ORDER — BISACODYL 5 MG PO TBEC
5.0000 mg | DELAYED_RELEASE_TABLET | Freq: Every day | ORAL | Status: DC | PRN
  Administered 2022-05-30: 5 mg via ORAL
  Filled 2022-05-29: qty 1

## 2022-05-29 MED ORDER — BUPIVACAINE-EPINEPHRINE (PF) 0.25% -1:200000 IJ SOLN
INTRAMUSCULAR | Status: DC | PRN
  Administered 2022-05-29: 91 mL via INTRAMUSCULAR

## 2022-05-29 MED ORDER — OXYCODONE HCL 5 MG/5ML PO SOLN: 5.0000 mg | Freq: Once | ORAL | Status: DC | PRN

## 2022-05-29 MED ORDER — BUPIVACAINE HCL (PF) 0.5 % IJ SOLN
INTRAMUSCULAR | Status: AC
  Filled 2022-05-29: qty 10

## 2022-05-29 MED ORDER — PROPOFOL 1000 MG/100ML IV EMUL
INTRAVENOUS | Status: AC
  Filled 2022-05-29: qty 100

## 2022-05-29 MED ORDER — MIDAZOLAM HCL 2 MG/2ML IJ SOLN
INTRAMUSCULAR | Status: AC
  Filled 2022-05-29: qty 2

## 2022-05-29 MED ORDER — DEXMEDETOMIDINE HCL IN NACL 200 MCG/50ML IV SOLN
INTRAVENOUS | Status: DC | PRN
  Administered 2022-05-29 (×2): 12 ug via INTRAVENOUS

## 2022-05-29 MED ORDER — SODIUM CHLORIDE 0.9 % IV SOLN
INTRAVENOUS | Status: DC
Start: 2022-05-29 — End: 2022-05-31

## 2022-05-29 MED ORDER — ROSUVASTATIN CALCIUM 10 MG PO TABS
20.0000 mg | ORAL_TABLET | Freq: Every day | ORAL | Status: DC
  Administered 2022-05-29 – 2022-05-30 (×2): 20 mg via ORAL
  Filled 2022-05-29 (×2): qty 2

## 2022-05-29 MED ORDER — DOCUSATE SODIUM 100 MG PO CAPS
100.0000 mg | ORAL_CAPSULE | Freq: Two times a day (BID) | ORAL | Status: DC
  Administered 2022-05-29 – 2022-05-31 (×5): 100 mg via ORAL
  Filled 2022-05-29 (×5): qty 1

## 2022-05-29 MED ORDER — FENTANYL CITRATE (PF) 100 MCG/2ML IJ SOLN
INTRAMUSCULAR | Status: DC | PRN
  Administered 2022-05-29 (×2): 50 ug via INTRAVENOUS

## 2022-05-29 MED ORDER — CHLORHEXIDINE GLUCONATE 0.12 % MT SOLN: 15.0000 mL | Freq: Once | OROMUCOSAL | Status: AC

## 2022-05-29 MED ORDER — MENTHOL 3 MG MT LOZG: 1.0000 | LOZENGE | OROMUCOSAL | Status: DC | PRN

## 2022-05-29 MED ORDER — ORAL CARE MOUTH RINSE: 15.0000 mL | Freq: Once | OROMUCOSAL | Status: AC

## 2022-05-29 MED ORDER — CEFAZOLIN IN SODIUM CHLORIDE 3-0.9 GM/100ML-% IV SOLN
3.0000 g | INTRAVENOUS | Status: DC
  Filled 2022-05-29: qty 100

## 2022-05-29 MED ORDER — ACETAMINOPHEN 10 MG/ML IV SOLN
INTRAVENOUS | Status: DC | PRN
  Administered 2022-05-29: 1000 mg via INTRAVENOUS

## 2022-05-29 MED ORDER — DEXAMETHASONE SODIUM PHOSPHATE 10 MG/ML IJ SOLN
INTRAMUSCULAR | Status: DC | PRN
  Administered 2022-05-29: 10 mg via INTRAVENOUS

## 2022-05-29 MED ORDER — BUPIVACAINE HCL (PF) 0.5 % IJ SOLN
INTRAMUSCULAR | Status: DC | PRN
  Administered 2022-05-29: 2.3 mL via INTRATHECAL

## 2022-05-29 MED ORDER — FENTANYL CITRATE (PF) 100 MCG/2ML IJ SOLN: 25.0000 ug | INTRAMUSCULAR | Status: DC | PRN

## 2022-05-29 MED ORDER — METOCLOPRAMIDE HCL 5 MG PO TABS
5.0000 mg | ORAL_TABLET | Freq: Three times a day (TID) | ORAL | Status: DC | PRN
  Filled 2022-05-29: qty 2

## 2022-05-29 MED ORDER — LIDOCAINE HCL (PF) 2 % IJ SOLN
INTRAMUSCULAR | Status: AC
  Filled 2022-05-29: qty 5

## 2022-05-29 MED ORDER — ACETAMINOPHEN 10 MG/ML IV SOLN: 1000.0000 mg | Freq: Once | INTRAVENOUS | Status: DC | PRN

## 2022-05-29 MED ORDER — MIDAZOLAM HCL 5 MG/5ML IJ SOLN
INTRAMUSCULAR | Status: DC | PRN
  Administered 2022-05-29: 2 mg via INTRAVENOUS
  Administered 2022-05-29 (×2): 1 mg via INTRAVENOUS

## 2022-05-29 MED ORDER — MORPHINE SULFATE (PF) 10 MG/ML IV SOLN
INTRAVENOUS | Status: AC
  Filled 2022-05-29: qty 1

## 2022-05-29 MED ORDER — MAGNESIUM HYDROXIDE 400 MG/5ML PO SUSP
30.0000 mL | Freq: Every day | ORAL | Status: DC
  Administered 2022-05-29 – 2022-05-30 (×2): 30 mL via ORAL
  Filled 2022-05-29 (×2): qty 30

## 2022-05-29 MED ORDER — LEVOCETIRIZINE DIHYDROCHLORIDE 5 MG PO TABS
5.0000 mg | ORAL_TABLET | Freq: Every evening | ORAL | Status: DC
Start: 2022-05-29 — End: 2022-05-29

## 2022-05-29 MED ORDER — GLYCOPYRROLATE 0.2 MG/ML IJ SOLN
INTRAMUSCULAR | Status: AC
  Filled 2022-05-29: qty 1

## 2022-05-29 MED ORDER — OXYCODONE HCL 5 MG PO TABS
5.0000 mg | ORAL_TABLET | ORAL | Status: DC | PRN
  Administered 2022-05-29 – 2022-05-31 (×4): 10 mg via ORAL
  Filled 2022-05-29 (×3): qty 2

## 2022-05-29 MED ORDER — OXYCODONE HCL 5 MG PO TABS
10.0000 mg | ORAL_TABLET | ORAL | Status: DC | PRN
  Administered 2022-05-30: 15 mg via ORAL
  Administered 2022-05-30: 10 mg via ORAL
  Filled 2022-05-29: qty 3
  Filled 2022-05-29 (×2): qty 2

## 2022-05-29 MED ORDER — LACTATED RINGERS IV SOLN: INTRAVENOUS | Status: DC

## 2022-05-29 MED ORDER — PROPOFOL 500 MG/50ML IV EMUL
INTRAVENOUS | Status: DC | PRN
  Administered 2022-05-29: 50 ug/kg/min via INTRAVENOUS

## 2022-05-29 MED ORDER — FAMOTIDINE 20 MG PO TABS
ORAL_TABLET | ORAL | Status: AC
  Administered 2022-05-29: 20 mg via ORAL
  Filled 2022-05-29: qty 1

## 2022-05-29 MED ORDER — DIPHENHYDRAMINE HCL 12.5 MG/5ML PO ELIX: 12.5000 mg | ORAL_SOLUTION | ORAL | Status: DC | PRN

## 2022-05-29 MED ORDER — METOCLOPRAMIDE HCL 5 MG/ML IJ SOLN: 5.0000 mg | Freq: Three times a day (TID) | INTRAMUSCULAR | Status: DC | PRN

## 2022-05-29 MED ORDER — ENOXAPARIN SODIUM 30 MG/0.3ML IJ SOSY
30.0000 mg | PREFILLED_SYRINGE | Freq: Two times a day (BID) | INTRAMUSCULAR | Status: DC
Start: 2022-05-30 — End: 2022-05-31
  Administered 2022-05-30 – 2022-05-31 (×3): 30 mg via SUBCUTANEOUS
  Filled 2022-05-29 (×3): qty 0.3

## 2022-05-29 MED ORDER — LORATADINE 10 MG PO TABS
10.0000 mg | ORAL_TABLET | Freq: Every evening | ORAL | Status: DC
  Administered 2022-05-29 – 2022-05-30 (×2): 10 mg via ORAL
  Filled 2022-05-29 (×2): qty 1

## 2022-05-29 MED ORDER — ONDANSETRON HCL 4 MG/2ML IJ SOLN: 4.0000 mg | Freq: Four times a day (QID) | INTRAMUSCULAR | Status: DC | PRN

## 2022-05-29 MED ORDER — HYDROMORPHONE HCL 1 MG/ML IJ SOLN
0.5000 mg | INTRAMUSCULAR | Status: DC | PRN
  Administered 2022-05-29 – 2022-05-30 (×2): 1 mg via INTRAVENOUS
  Administered 2022-05-30: 0.5 mg via INTRAVENOUS
  Administered 2022-05-30: 1 mg via INTRAVENOUS
  Filled 2022-05-29 (×4): qty 1

## 2022-05-29 MED ORDER — ACETAMINOPHEN 325 MG PO TABS: 325.0000 mg | ORAL_TABLET | Freq: Four times a day (QID) | ORAL | Status: DC | PRN

## 2022-05-29 MED ORDER — TRAMADOL HCL 50 MG PO TABS
50.0000 mg | ORAL_TABLET | Freq: Four times a day (QID) | ORAL | Status: DC
  Administered 2022-05-29 – 2022-05-31 (×8): 50 mg via ORAL
  Filled 2022-05-29 (×8): qty 1

## 2022-05-29 MED ORDER — DEXTROSE 5 % IV SOLN
INTRAVENOUS | Status: DC | PRN
  Administered 2022-05-29: 3 g via INTRAVENOUS

## 2022-05-29 MED ORDER — ALUM & MAG HYDROXIDE-SIMETH 200-200-20 MG/5ML PO SUSP: 30.0000 mL | ORAL | Status: DC | PRN

## 2022-05-29 MED ORDER — DEXAMETHASONE SODIUM PHOSPHATE 10 MG/ML IJ SOLN
INTRAMUSCULAR | Status: AC
  Filled 2022-05-29: qty 1

## 2022-05-29 MED ORDER — SENNOSIDES-DOCUSATE SODIUM 8.6-50 MG PO TABS
1.0000 | ORAL_TABLET | Freq: Every evening | ORAL | Status: DC | PRN
  Administered 2022-05-30: 1 via ORAL
  Filled 2022-05-29: qty 1

## 2022-05-29 MED ORDER — ONDANSETRON HCL 4 MG PO TABS: 4.0000 mg | ORAL_TABLET | Freq: Four times a day (QID) | ORAL | Status: DC | PRN

## 2022-05-29 MED ORDER — CYCLOBENZAPRINE HCL 10 MG PO TABS
10.0000 mg | ORAL_TABLET | Freq: Three times a day (TID) | ORAL | Status: DC
  Administered 2022-05-29 – 2022-05-31 (×6): 10 mg via ORAL
  Filled 2022-05-29 (×6): qty 1

## 2022-05-29 MED ORDER — CHLORHEXIDINE GLUCONATE 0.12 % MT SOLN
OROMUCOSAL | Status: AC
  Administered 2022-05-29: 15 mL via OROMUCOSAL
  Filled 2022-05-29: qty 15

## 2022-05-29 MED ORDER — OXYCODONE HCL 5 MG PO TABS: 5.0000 mg | ORAL_TABLET | Freq: Once | ORAL | Status: DC | PRN

## 2022-05-29 MED ORDER — FAMOTIDINE 20 MG PO TABS: 20.0000 mg | ORAL_TABLET | Freq: Once | ORAL | Status: AC

## 2022-05-29 MED ORDER — SURGIPHOR WOUND IRRIGATION SYSTEM - OPTIME: TOPICAL | Status: DC | PRN

## 2022-05-29 MED ORDER — EPHEDRINE 5 MG/ML INJ
INTRAVENOUS | Status: AC
  Filled 2022-05-29: qty 5

## 2022-05-29 MED ORDER — UBROGEPANT 100 MG PO TABS: 1.0000 | ORAL_TABLET | ORAL | Status: DC | PRN

## 2022-05-29 SURGICAL SUPPLY — 69 items
BLADE SAGITTAL 25.0X1.19X90 (BLADE) ×2 IMPLANT
BLADE SAW 90X13X1.19 OSCILLAT (BLADE) ×2 IMPLANT
BNDG ELASTIC 6X5.8 VLCR STR LF (GAUZE/BANDAGES/DRESSINGS) ×2 IMPLANT
CANISTER WOUND CARE 500ML ATS (WOUND CARE) ×2 IMPLANT
CEMENT HV SMART SET (Cement) ×4 IMPLANT
CHLORAPREP W/TINT 26 (MISCELLANEOUS) ×4 IMPLANT
COOLER POLAR GLACIER W/PUMP (MISCELLANEOUS) ×2 IMPLANT
CUFF TOURN SGL QUICK 24 (TOURNIQUET CUFF)
CUFF TOURN SGL QUICK 34 (TOURNIQUET CUFF)
CUFF TRNQT CYL 24X4X16.5-23 (TOURNIQUET CUFF) IMPLANT
CUFF TRNQT CYL 34X4.125X (TOURNIQUET CUFF) IMPLANT
DRAPE 3/4 80X56 (DRAPES) ×4 IMPLANT
DRSG MEPILEX SACRM 8.7X9.8 (GAUZE/BANDAGES/DRESSINGS) ×2 IMPLANT
ELECT CAUTERY BLADE 6.4 (BLADE) ×2 IMPLANT
ELECT REM PT RETURN 9FT ADLT (ELECTROSURGICAL) ×2
ELECTRODE REM PT RTRN 9FT ADLT (ELECTROSURGICAL) ×1 IMPLANT
FEMORAL COMP CEMENTED SZ5 (Femur) ×1 IMPLANT
GAUZE 4X4 16PLY ~~LOC~~+RFID DBL (SPONGE) ×1 IMPLANT
GAUZE XEROFORM 1X8 LF (GAUZE/BANDAGES/DRESSINGS) ×1 IMPLANT
GLOVE BIOGEL PI IND STRL 9 (GLOVE) ×1 IMPLANT
GLOVE BIOGEL PI INDICATOR 9 (GLOVE) ×1
GLOVE SURG ORTHO 8.0 STRL STRW (GLOVE) ×2 IMPLANT
GLOVE SURG SYN 9.0  PF PI (GLOVE) ×1
GLOVE SURG SYN 9.0 PF PI (GLOVE) ×1 IMPLANT
GLOVE SURG UNDER LTX SZ8 (GLOVE) ×2 IMPLANT
GOWN SRG 2XL LVL 4 RGLN SLV (GOWNS) ×1 IMPLANT
GOWN STRL NON-REIN 2XL LVL4 (GOWNS) ×1
GOWN STRL REUS W/ TWL LRG LVL3 (GOWN DISPOSABLE) ×1 IMPLANT
GOWN STRL REUS W/ TWL XL LVL3 (GOWN DISPOSABLE) ×1 IMPLANT
GOWN STRL REUS W/TWL LRG LVL3 (GOWN DISPOSABLE) ×1
GOWN STRL REUS W/TWL XL LVL3 (GOWN DISPOSABLE) ×1
HOLDER FOLEY CATH W/STRAP (MISCELLANEOUS) ×1 IMPLANT
HOOD PEEL AWAY FLYTE STAYCOOL (MISCELLANEOUS) ×4 IMPLANT
INSERT TIBIAL SZ4 RIGHT 10MM (Insert) ×1 IMPLANT
IV NS IRRIG 3000ML ARTHROMATIC (IV SOLUTION) ×2 IMPLANT
KIT PREVENA INCISION MGT20CM45 (CANNISTER) ×2 IMPLANT
KIT TURNOVER KIT A (KITS) ×2 IMPLANT
MANIFOLD NEPTUNE II (INSTRUMENTS) ×4 IMPLANT
NDL SAFETY ECLIPSE 18X1.5 (NEEDLE) ×1 IMPLANT
NDL SPNL 20GX3.5 QUINCKE YW (NEEDLE) ×1 IMPLANT
NEEDLE HYPO 18GX1.5 SHARP (NEEDLE) ×1
NEEDLE SPNL 20GX3.5 QUINCKE YW (NEEDLE) ×2 IMPLANT
NS IRRIG 1000ML POUR BTL (IV SOLUTION) ×2 IMPLANT
PACK TOTAL KNEE (MISCELLANEOUS) ×2 IMPLANT
PAD WRAPON POLAR KNEE (MISCELLANEOUS) ×1 IMPLANT
PAD WRAPON POLOR MULTI XL (MISCELLANEOUS) IMPLANT
PATELLA RESURFACING MEDACTA SZ (Bone Implant) ×1 IMPLANT
PULSAVAC PLUS IRRIG FAN TIP (DISPOSABLE) ×2
SCALPEL PROTECTED #10 DISP (BLADE) ×4 IMPLANT
SOLUTION IRRIG SURGIPHOR (IV SOLUTION) ×2 IMPLANT
STAPLER SKIN PROX 35W (STAPLE) ×2 IMPLANT
STEM EXTENSION 11MMX30MM (Stem) ×1 IMPLANT
SUCTION FRAZIER HANDLE 10FR (MISCELLANEOUS) ×2
SUCTION TUBE FRAZIER 10FR DISP (MISCELLANEOUS) ×1 IMPLANT
SUT DVC 2 QUILL PDO  T11 36X36 (SUTURE) ×1
SUT DVC 2 QUILL PDO T11 36X36 (SUTURE) ×1 IMPLANT
SUT ETHIBOND 2 V 37 (SUTURE) ×1 IMPLANT
SUT V-LOC 90 ABS DVC 3-0 CL (SUTURE) ×2 IMPLANT
SYR 20ML LL LF (SYRINGE) ×2 IMPLANT
SYR 50ML LL SCALE MARK (SYRINGE) ×4 IMPLANT
TIBIAL TRAY FIXED 4 MEDACTA 02 (Joint) ×1 IMPLANT
TIP FAN IRRIG PULSAVAC PLUS (DISPOSABLE) ×1 IMPLANT
TOWEL OR 17X26 4PK STRL BLUE (TOWEL DISPOSABLE) ×1 IMPLANT
TOWER CARTRIDGE SMART MIX (DISPOSABLE) ×2 IMPLANT
TRAY FOLEY MTR SLVR 16FR STAT (SET/KITS/TRAYS/PACK) ×2 IMPLANT
WATER STERILE IRR 1000ML POUR (IV SOLUTION) ×2 IMPLANT
WRAP-ON POLOR PAD MULTI XL (MISCELLANEOUS) ×1
WRAPON POLAR PAD KNEE (MISCELLANEOUS)
WRAPON POLOR PAD MULTI XL (MISCELLANEOUS) ×1

## 2022-05-29 NOTE — Anesthesia Procedure Notes (Signed)
Spinal  Patient location during procedure: OR Reason for block: surgical anesthesia Staffing Performed: anesthesiologist  Anesthesiologist: Zak, Arthur, MD Resident/CRNA: Logan, Benjamin, CRNA Preanesthetic Checklist Completed: patient identified, IV checked, site marked, risks and benefits discussed, surgical consent, monitors and equipment checked, pre-op evaluation and timeout performed Spinal Block Patient position: sitting Prep: ChloraPrep, DuraPrep and site prepped and draped Patient monitoring: heart rate, continuous pulse ox, blood pressure and cardiac monitor Approach: midline Location: L3-4 Injection technique: single-shot Needle Needle type: Quincke  Needle gauge: 22 G Needle length: 12.7 cm Assessment Events: CSF return Additional Notes Negative paresthesia. Negative blood return. Positive free-flowing CSF. Expiration date of kit checked and confirmed. Patient tolerated procedure well, without complications.      

## 2022-05-29 NOTE — H&P (Signed)
Chief Complaint  Patient presents with   Pre-op Exam  Scheduled for right TKA 05/29/22 with Dr. Rosita Kea    History of the Present Illness: Monique Patton is a 57 y.o. female here today for history and physical for right total knee arthroplasty with Dr. Kennedy Bucker on 05/29/2022. Patient has advanced right knee osteoarthritis with near complete loss of joint space in the medial compartment with tricompartmental degenerative changes. Her pain is severe and interfering with quality of life and activities of daily living. Knee is buckling and giving away on her as well as catching and locking. She is underwent a successful left total knee arthroplasty in November 2022 and is doing well from this. She continues with home exercises for her left knee and is seeing continued improvement. Her right knee is severely painful along the medial joint line. She had cortisone injections in the past initially with some relief but most recently no improvement. Patient will have pain in the right knee with rest as well as with activity.  I have reviewed past medical, surgical, social and family history, and allergies as documented in the EMR.  Past Medical History: Past Medical History:  Diagnosis Date   Arthritis   Migraine headache   Obesity   Past Surgical History: Past Surgical History:  Procedure Laterality Date   neck surgery 11/03/2013   COLONOSCOPY 01/07/2018  Normal/ No specimens collected/PHx Cp/ Repeat in 5 years/ TKT   REVISION TOTAL KNEE ARTHROPLASTY Left 11/09/2021  Tramain Gershman   back surgery   GASTRIC BYPASS OPEN   HYSTERECTOMY   sleep apnea  uvula   Past Family History: Family History  Problem Relation Age of Onset   Stroke Father   Diabetes type II Father   High blood pressure (Hypertension) Father   Myocardial Infarction (Heart attack) Father   Medications: Current Outpatient Medications Ordered in Epic  Medication Sig Dispense Refill   cholecalciferol (VITAMIN D3) 5,000 unit capsule  Take by mouth   cyclobenzaprine (FLEXERIL) 10 MG tablet Take 10 mg by mouth 3 (three) times daily.   ibuprofen (MOTRIN) 200 MG tablet Take 800 mg by mouth as needed for Pain   levocetirizine (XYZAL) 5 MG tablet Take 1 tablet by mouth once daily   OZEMPIC 0.25 mg or 0.5 mg(2 mg/1.5 mL) pen injector   rosuvastatin (CRESTOR) 20 MG tablet Take 1 tablet by mouth once daily   ubrogepant (UBRELVY) 100 mg Tab Take 1 tablet by mouth as needed   No current Epic-ordered facility-administered medications on file.   Allergies: Allergies  Allergen Reactions   Other Itching and Rash  Cat Dander   Animal Dander Rash   Tramadol Rash   Penicillins Itching and Rash   Shellfish Containing Products Rash    Body mass index is 43.23 kg/m.  Review of Systems: A comprehensive 14 point ROS was performed, reviewed, and the pertinent orthopaedic findings are documented in the HPI.  Vitals:  05/21/22 0816  BP: (!) 140/84    General Physical Examination:   General:  Well developed, well nourished, no apparent distress, normal affect, normal gait with no antalgic component.   HEENT: Head normocephalic, atraumatic, PERRL.   Abdomen: Soft, non tender, non distended, Bowel sounds present.  Heart: Examination of the heart reveals regular, rate, and rhythm. There is no murmur noted on ascultation. There is a normal apical pulse.  Lungs: Lungs are clear to auscultation. There is no wheeze, rhonchi, or crackles. There is normal expansion of bilateral chest walls.   Right  knee:  On exam, right knee has crepitation with range of motion. Varus deformity that is passively correctable. Right knee range of motion is 0-110 degrees. Left knee has 0-110 degrees range of motion. No crepitation. Stable to exam.  Radiographs: AP lateral and sunrise views of the right knee reviewed by me from 12/22/2021 show near complete loss of joint space in the medial compartment with spurring and sclerotic changes.  Significant spurring in the lateral compartment along the lateral femoral condyle and lateral tibial plateau. No significant valgus or varus deformity. Advanced patellofemoral arthritis with sclerotic changes and spurring  Assessment: ICD-10-CM  1. Primary osteoarthritis of right knee M17.11   Plan:  84. 57 year old female with advanced right knee osteoarthritis. Pain is severe and interfering with quality of life and activities daily living. No relief with conservative treatment. Risks, benefits, complications of a right total knee arthroplasty have been discussed with the patient and patient has agreed and consented to the procedure with Dr. Kennedy Bucker on 05/29/2022.   Electronically signed by Patience Musca, PA at 05/21/2022 8:44 AM EDT  Reviewed  H+P. No changes noted.

## 2022-05-29 NOTE — Transfer of Care (Signed)
Immediate Anesthesia Transfer of Care Note  Patient: Monique Patton  Procedure(s) Performed: TOTAL KNEE ARTHROPLASTY (Right: Knee)  Patient Location: PACU  Anesthesia Type:General  Level of Consciousness: awake, drowsy and patient cooperative  Airway & Oxygen Therapy: Patient Spontanous Breathing  Post-op Assessment: Report given to RN and Post -op Vital signs reviewed and stable  Post vital signs: Reviewed and stable  Last Vitals:  Vitals Value Taken Time  BP 122/74 05/29/22 1316  Temp 36.5 C 05/29/22 1316  Pulse 79 05/29/22 1318  Resp 30 05/29/22 1318  SpO2 96 % 05/29/22 1318  Vitals shown include unvalidated device data.  Last Pain:  Vitals:   05/29/22 0816  TempSrc: Temporal  PainSc: 4          Complications: No notable events documented.

## 2022-05-29 NOTE — Plan of Care (Signed)

## 2022-05-29 NOTE — Anesthesia Preprocedure Evaluation (Signed)
Anesthesia Evaluation  Patient identified by MRN, date of birth, ID band Patient awake    Reviewed: Allergy & Precautions, NPO status , Patient's Chart, lab work & pertinent test results  History of Anesthesia Complications Negative for: history of anesthetic complications  Airway Mallampati: II  TM Distance: >3 FB Neck ROM: Full    Dental no notable dental hx. (+) Teeth Intact   Pulmonary neg pulmonary ROS, neg sleep apnea, neg COPD, Patient abstained from smoking.Not current smoker, former smoker,    Pulmonary exam normal breath sounds clear to auscultation       Cardiovascular Exercise Tolerance: Good METS(-) hypertension(-) CAD and (-) Past MI negative cardio ROS  (-) dysrhythmias  Rhythm:Regular Rate:Normal - Systolic murmurs    Neuro/Psych  Headaches, negative neurological ROS  negative psych ROS   GI/Hepatic neg GERD  ,(+)     (-) substance abuse  ,   Endo/Other  neg diabetesMorbid obesity  Renal/GU negative Renal ROS     Musculoskeletal  (+) Arthritis ,   Abdominal (+) + obese,   Peds  Hematology  (+) Blood dyscrasia, anemia ,   Anesthesia Other Findings Past Medical History: No date: Allergy No date: Anemia No date: Arthritis No date: Eczema No date: H/O gastric bypass No date: Heart murmur     Comment:  since birth No date: Persistent headaches No date: Pre-diabetes No date: Vitamin D deficiency  Reproductive/Obstetrics                             Anesthesia Physical Anesthesia Plan  ASA: 3  Anesthesia Plan: Spinal   Post-op Pain Management: Ofirmev IV (intra-op)*   Induction: Intravenous  PONV Risk Score and Plan: 2 and Ondansetron, Dexamethasone, Propofol infusion, TIVA and Midazolam  Airway Management Planned: Natural Airway  Additional Equipment: None  Intra-op Plan:   Post-operative Plan:   Informed Consent: I have reviewed the patients History  and Physical, chart, labs and discussed the procedure including the risks, benefits and alternatives for the proposed anesthesia with the patient or authorized representative who has indicated his/her understanding and acceptance.       Plan Discussed with: CRNA and Surgeon  Anesthesia Plan Comments: (Discussed R/B/A of neuraxial anesthesia technique with patient: - rare risks of spinal/epidural hematoma, nerve damage, infection - Risk of PDPH - Risk of nausea and vomiting - Risk of conversion to general anesthesia and its associated risks, including sore throat, damage to lips/eyes/teeth/oropharynx, and rare risks such as cardiac and respiratory events. - Risk of allergic reactions  Discussed the role of CRNA in patient's perioperative care.  Patient voiced understanding.)        Anesthesia Quick Evaluation

## 2022-05-29 NOTE — Evaluation (Signed)
Physical Therapy Evaluation Patient Details Name: Monique Patton MRN: [id number] DOB: Aug 14, 1965 Today's Date: 05/29/2022  History of Present Illness  57 y/o female s/p R TKA 05/29/22; had L TKA 11/22.  Clinical Impression  Pt with exquisite pain on first attempt to see, even after oral and IV meds reporting 8+/10 pain t/o session.  Despite this she showed great effort and was able to do some exercises and some labored ambulation that brought on nausea and ultimately she needed to stop and sit.  Pt did relatively well with L TKA ~6 months ago, she expects to be able to more fully participate tomorrow.       Recommendations for follow up therapy are one component of a multi-disciplinary discharge planning process, led by the attending physician.  Recommendations may be updated based on patient status, additional functional criteria and insurance authorization.  Follow Up Recommendations Follow physician's recommendations for discharge plan and follow up therapies    Assistance Recommended at Discharge PRN  Patient can return home with the following  Assistance with cooking/housework;Assist for transportation    Equipment Recommendations None recommended by PT  Recommendations for Other Services       Functional Status Assessment Patient has had a recent decline in their functional status and demonstrates the ability to make significant improvements in function in a reasonable and predictable amount of time.     Precautions / Restrictions Precautions Precautions: Fall;Knee Precaution Booklet Issued: Yes (comment) Restrictions Weight Bearing Restrictions: Yes RLE Weight Bearing: Weight bearing as tolerated      Mobility  Bed Mobility Overal bed mobility: Modified Independent             General bed mobility comments: slow and bed rail reliant, but able to attain EOB w/o assist    Transfers Overall transfer level: Needs assistance Equipment used: Rolling walker (2  wheels) Transfers: Sit to/from Stand Sit to Stand: Min guard, Min assist, From elevated surface           General transfer comment: Pt unable to rise to standing from standard height bed, raised ~2" and with heavy forward lean  and light guidance to keep hips forward pt able to rise to standing    Ambulation/Gait Ambulation/Gait assistance: Min assist Gait Distance (Feet): 10 Feet Assistive device: Rolling walker (2 wheels)         General Gait Details: Very slow and guarded.  She was able to accept weight relatively well and showed appropriate knee flexion mechanics on the L but hihghly reliant on the walker.  Pt feeling nauseated and needing to sit after relatively modest effort.  Stairs            Wheelchair Mobility    Modified Rankin (Stroke Patients Only)       Balance Overall balance assessment: Modified Independent                                           Pertinent Vitals/Pain Pain Assessment Pain Assessment: 0-10 Pain Score: 9     Home Living Family/patient expects to be discharged to:: Private residence (Simultaneous filing. User may not have seen previous data.) Living Arrangements: Spouse/significant other (Simultaneous filing. User may not have seen previous data.) Available Help at Discharge: Family;Available 24 hours/day Type of Home: House Home Access: Stairs to enter Entrance Stairs-Rails: Right Entrance Stairs-Number of Steps: 2   Home Layout: One  level Home Equipment: Agricultural consultant (2 wheels);Cane - single point      Prior Function Prior Level of Function : Independent/Modified Independent                     Hand Dominance        Extremity/Trunk Assessment   Upper Extremity Assessment Upper Extremity Assessment: Overall WFL for tasks assessed    Lower Extremity Assessment Lower Extremity Assessment: Overall WFL for tasks assessed (expected post-op weakness, AAROM SLRs only)       Communication    Communication: No difficulties  Cognition Arousal/Alertness: Suspect due to medications, Lethargic Behavior During Therapy: WFL for tasks assessed/performed Overall Cognitive Status: Within Functional Limits for tasks assessed                                          General Comments General comments (skin integrity, edema, etc.): motivated but very pain limited t/o the session    Exercises Total Joint Exercises Ankle Circles/Pumps: AROM, 10 reps Quad Sets: Strengthening, 10 reps Heel Slides: AAROM, 5 reps Hip ABduction/ADduction: Strengthening, 10 reps Straight Leg Raises: AAROM, 5 reps Knee Flexion: PROM, 5 reps Goniometric ROM: 2-84   Assessment/Plan    PT Assessment Patient needs continued PT services  PT Problem List Decreased strength;Decreased range of motion;Decreased activity tolerance;Decreased balance;Decreased mobility;Decreased knowledge of use of DME;Decreased safety awareness       PT Treatment Interventions Gait training;Therapeutic activities;Therapeutic exercise;Stair training;DME instruction;Functional mobility training;Balance training;Patient/family education    PT Goals (Current goals can be found in the Care Plan section)  Acute Rehab PT Goals Patient Stated Goal: go home PT Goal Formulation: With patient Time For Goal Achievement: 06/12/22 Potential to Achieve Goals: Good    Frequency BID     Co-evaluation               AM-PAC PT "6 Clicks" Mobility  Outcome Measure Help needed turning from your back to your side while in a flat bed without using bedrails?: None Help needed moving from lying on your back to sitting on the side of a flat bed without using bedrails?: None Help needed moving to and from a bed to a chair (including a wheelchair)?: A Little Help needed standing up from a chair using your arms (e.g., wheelchair or bedside chair)?: A Little Help needed to walk in hospital room?: A Lot Help needed climbing 3-5  steps with a railing? : A Lot 6 Click Score: 18    End of Session Equipment Utilized During Treatment: Gait belt Activity Tolerance: Patient limited by fatigue;Patient limited by pain Patient left: with chair alarm set;with call bell/phone within reach;with family/visitor present Nurse Communication: Mobility status PT Visit Diagnosis: Muscle weakness (generalized) (M62.81);Difficulty in walking, not elsewhere classified (R26.2);Pain Pain - Right/Left: Right Pain - part of body: Knee    Time: 7564-3329 PT Time Calculation (min) (ACUTE ONLY): 41 min   Charges:   PT Evaluation $PT Eval Low Complexity: 1 Low PT Treatments $Gait Training: 8-22 mins $Therapeutic Exercise: 8-22 mins        Malachi Pro, DPT 05/29/2022, 6:21 PM

## 2022-05-29 NOTE — Op Note (Signed)
05/29/2022  1:20 PM  PATIENT:  Monique Patton  57 y.o. female  PRE-OPERATIVE DIAGNOSIS:  Primary osteoarthritis of right knee  M17.11  POST-OPERATIVE DIAGNOSIS:  Primary osteoarthritis of right knee  M17.11  PROCEDURE:  Procedure(s): TOTAL KNEE ARTHROPLASTY (Right)  SURGEON: Leitha Schuller, MD  ASSISTANTS: Cranston Neighbor, PA-C  ANESTHESIA:   spinal  EBL:  Total I/O In: 1450 [I.V.:1300; IV Piggyback:150] Out: 225 [Urine:150; Blood:75]  BLOOD ADMINISTERED:none  DRAINS:  Incisional wound VAC    LOCAL MEDICATIONS USED:  MARCAINE    and OTHER Exparel morphine Toradol  SPECIMEN:  No Specimen  DISPOSITION OF SPECIMEN:  N/A  COUNTS:  YES  TOURNIQUET:   Total Tourniquet Time Documented: Thigh (Right) - 69 minutes Total: Thigh (Right) - 69 minutes   IMPLANTS: Medacta GM K sphere right 5 femur, right 4 tibia, short stem with 10 mm insert and size 3 patella all components cemented  DICTATION: .Dragon Dictation  patient was brought to the operating room and spinal anesthesia was obtained.  After prepping and draping the right leg in sterile fashion, and after patient identification and timeout procedures were completed, tourniquet was raised  and midline skin incision was made followed by medial parapatellar arthrotomy with severe medial compartment osteoarthritis, a severe patellofemoral arthritis and severe lateral compartment arthritis, partial synovectomy was also carried out there was exposed bone in all compartments.   There was extensive inflammatory tissue throughout the joint the ACL and PCL and fat pad were excised along with anterior horns of the meniscus. The proximal tibia cutting guide from  the extra medullary  system was applied and the proximal tibia cut carried out.  The distal femoral cut was carried out in a similar fashion using the intramedullary guide.  9 mm distal femur cut was made and sizing guide applied and sized to a size 5.     The 5 femoral cutting guide  applied with anterior posterior and chamfer cuts made.  The posterior horns of the menisci were removed at this point.   Injection of the above medication was carried out after the femoral and tibial cuts were carried out.  The 4 baseplate trial was placed pinned into position and proximal tibial preparation carried out with drilling hand reaming and the keel punch followed by placement of the 5 femur and sizing the tibial insert size 10 millimeter gave the best fit with stability and full extension.  The distal femoral drill holes were made in the notch cut for the trochlear groove was then carried out with trials were then removed the patella was cut using the patellar cutting guide and it sized to a size 3 after drill holes have been made  The knee was irrigated with pulsatile lavage and the bony surfaces dried the tibial component was cemented into place first.  Excess cement was removed and the polyethylene insert placed with a torque screw placed with a torque screwdriver tightened.  The distal femoral component was placed and the knee was held in extension as the patellar button was clamped into place.  After the cement was set, excess cement was removed and the knee was again irrigated thoroughly thoroughly irrigated.  The tourniquet was let down and hemostasis checked with electrocautery. The arthrotomy was repaired with a heavy Quill suture, along with #2 Ethibond, followed by 3-0 V lock subcuticular closure, skin staples followed by incisional wound VAC and Polar Care.Marland Kitchen  PLAN OF CARE: Admit for overnight observation  PATIENT DISPOSITION:  PACU -  hemodynamically stable.

## 2022-05-30 ENCOUNTER — Encounter: Payer: Self-pay | Admitting: Orthopedic Surgery

## 2022-05-30 DIAGNOSIS — M1711 Unilateral primary osteoarthritis, right knee: Secondary | ICD-10-CM | POA: Diagnosis not present

## 2022-05-30 LAB — CBC
HCT: 29 % — ABNORMAL LOW (ref 36.0–46.0)
Hemoglobin: 9.4 g/dL — ABNORMAL LOW (ref 12.0–15.0)
MCH: 26.7 pg (ref 26.0–34.0)
MCHC: 32.4 g/dL (ref 30.0–36.0)
MCV: 82.4 fL (ref 80.0–100.0)
Platelets: 211 10*3/uL (ref 150–400)
RBC: 3.52 MIL/uL — ABNORMAL LOW (ref 3.87–5.11)
RDW: 15.9 % — ABNORMAL HIGH (ref 11.5–15.5)
WBC: 7.9 10*3/uL (ref 4.0–10.5)
nRBC: 0 % (ref 0.0–0.2)

## 2022-05-30 LAB — BASIC METABOLIC PANEL
Anion gap: 9 (ref 5–15)
BUN: 14 mg/dL (ref 6–20)
CO2: 24 mmol/L (ref 22–32)
Calcium: 8.6 mg/dL — ABNORMAL LOW (ref 8.9–10.3)
Chloride: 102 mmol/L (ref 98–111)
Creatinine, Ser: 0.79 mg/dL (ref 0.44–1.00)
GFR, Estimated: >60 mL/min (ref >60)
Glucose, Bld: 124 mg/dL — ABNORMAL HIGH (ref 70–99)
Potassium: 4 mmol/L (ref 3.5–5.1)
Sodium: 135 mmol/L (ref 135–145)

## 2022-05-30 NOTE — Anesthesia Postprocedure Evaluation (Signed)
Anesthesia Post Note  Patient: KEYANNA SANDEFER  Procedure(s) Performed: TOTAL KNEE ARTHROPLASTY (Right: Knee)  Patient location during evaluation: Nursing Unit Anesthesia Type: Spinal Level of consciousness: oriented and awake and alert Pain management: pain level controlled Vital Signs Assessment: post-procedure vital signs reviewed and stable Respiratory status: spontaneous breathing and respiratory function stable Cardiovascular status: blood pressure returned to baseline and stable Postop Assessment: no headache, no backache, no apparent nausea or vomiting and patient able to bend at knees Anesthetic complications: no   No notable events documented.   Last Vitals:  Vitals:   05/29/22 1938 05/30/22 0433  BP: (!) 156/85 101/79  Pulse: 82 (!) 106  Resp: 18 17  Temp: 36.7 C 37.2 C  SpO2: 100% 99%    Last Pain:  Vitals:   05/30/22 0657  TempSrc:   PainSc: 7                  Rikayla Demmon [address]

## 2022-05-30 NOTE — Progress Notes (Signed)
   Subjective: 1 Day Post-Op Procedure(s) (LRB): TOTAL KNEE ARTHROPLASTY (Right) Patient reports pain as mild to moderate  Patient is well, and has had no acute complaints or problems Denies any CP, SOB, ABD pain. We will continue therapy today.  Plan is to go Home after hospital stay.  Objective: Vital signs in last 24 hours: Temp:  [97.1 F (36.2 C)-98.9 F (37.2 C)] 98.9 F (37.2 C) (05/31 0433) Pulse Rate:  [58-106] 106 (05/31 0433) Resp:  [12-31] 17 (05/31 0433) BP: (101-156)/(65-88) 101/79 (05/31 0433) SpO2:  [98 %-100 %] 99 % (05/31 0433) Weight:  [409 kg] 127 kg (05/30 0816)  Intake/Output from previous day: 05/30 0701 - 05/31 0700 In: 3273.3 [P.O.:100; I.V.:2134.5; IV Piggyback:888.8] Out: 1525 [Urine:1300; Drains:150; Blood:75] Intake/Output this shift: No intake/output data recorded.  Recent Labs    05/29/22 1455  HGB 11.4*   Recent Labs    05/29/22 1455  WBC 6.1  RBC 4.35  HCT 36.5  PLT 197   Recent Labs    05/29/22 1455  CREATININE 0.81   No results for input(s): LABPT, INR in the last 72 hours.  EXAM General - Patient is Alert, Appropriate, and Oriented Extremity - Neurovascular intact Sensation intact distally Intact pulses distally Dorsiflexion/Plantar flexion intact Dressing - dressing C/D/I, provena intact with 150 cc drainage Motor Function - intact, moving foot and toes well on exam.   Past Medical History:  Diagnosis Date   Allergy    Anemia    Arthritis    Eczema    H/O gastric bypass    Heart murmur    since birth   Persistent headaches    Pre-diabetes    Vitamin D deficiency     Assessment/Plan:   1 Day Post-Op Procedure(s) (LRB): TOTAL KNEE ARTHROPLASTY (Right) Principal Problem:   S/P TKR (total knee replacement) using cement, right  Estimated body mass index is 45.19 kg/m as calculated from the following:   Height as of this encounter: 5\' 6"  (1.676 m).   Weight as of this encounter: 127 kg. Advance diet Up  with therapy Work on BM VSS, BP soft. Monitor VS with PT.  Labs pending Pain controlled CM to assist with discharge to home with HHPT. Most likely patient will discharge home tomorrow.  DVT Prophylaxis - Lovenox, TED hose, and SCDs Weight-Bearing as tolerated to right leg   T. , PA-C [facility] 05/30/2022, 7:14 AM

## 2022-05-30 NOTE — Evaluation (Signed)
Occupational Therapy Evaluation Patient Details Name: Monique Patton MRN: [id number] DOB: [birth year] Today's Date: 05/30/2022   History of Present Illness 57 y/o female s/p R TKA 05/29/22; had L TKA 11/22.   Clinical Impression   Pt was seen for OT evaluation this date. Prior to hospital admission, pt was independent with all ADLs. Pt lives with spouse in one level house. Pt reported 7/10 pain at the start of session - increased to 9/10 pain with mobility. Pt presents to acute OT demonstrating impaired ADL performance and functional mobility 2/2 decreased activity tolerance and R TKR - functional strength/ROM/balance deficits. Pt currently requires MIN A to don/doff polar care - pt was able to explain and demonstrate use of polar care; MIN A + RW for STS with vcs for safe hand placement; CGA + RW for toilet t/f; and CGA + RW for pericare and grooming tasks in standing. Pt experienced nausea with mobility, resolved with ginger ale. Pt would benefit from skilled OT to address noted impairments and functional limitations (see below for any additional details). Upon hospital discharge, recommend no OT follow up.      Recommendations for follow up therapy are one component of a multi-disciplinary discharge planning process, led by the attending physician.  Recommendations may be updated based on patient status, additional functional criteria and insurance authorization.   Follow Up Recommendations  No OT follow up    Assistance Recommended at Discharge Intermittent Supervision/Assistance  Patient can return home with the following A little help with walking and/or transfers;A little help with bathing/dressing/bathroom;Assistance with cooking/housework;Assist for transportation    Functional Status Assessment  Patient has had a recent decline in their functional status and demonstrates the ability to make significant improvements in function in a reasonable and predictable amount of time.  Equipment  Recommendations  BSC/3in1    Recommendations for Other Services       Precautions / Restrictions Precautions Precautions: Fall;Knee Precaution Booklet Issued: Yes (comment) Restrictions Weight Bearing Restrictions: Yes RLE Weight Bearing: Weight bearing as tolerated      Mobility Bed Mobility Overal bed mobility: Modified Independent             General bed mobility comments: MOD I for Sit<>supine with extra time and bed sheet as a leg lifter for RLE.    Transfers Overall transfer level: Needs assistance Equipment used: Rolling walker (2 wheels) Transfers: Sit to/from Stand Sit to Stand: Min assist                  Balance Overall balance assessment: Modified Independent                                         ADL either performed or assessed with clinical judgement   ADL Overall ADL's : Needs assistance/impaired                                       General ADL Comments: MIN A to don/doff polar care - pt was able to explain and demonstrate use of polar care. MIN A + RW for STS with vcs for safe hand placement. CGA + RW for toilet t/f. CGA + RW for pericare and grooming tasks in standing.      Pertinent Vitals/Pain Pain Assessment Pain Assessment: 0-10 Pain Score: 9  (Pt  reported pain as a 7 at start of session and increased to a 9 with mobility) Pain Location: R knee Pain Descriptors / Indicators: Grimacing, Discomfort Pain Intervention(s): Limited activity within patient's tolerance, Repositioned, Ice applied     Hand Dominance     Extremity/Trunk Assessment Upper Extremity Assessment Upper Extremity Assessment: Overall WFL for tasks assessed   Lower Extremity Assessment Lower Extremity Assessment: Defer to PT evaluation       Communication Communication Communication: No difficulties   Cognition Arousal/Alertness: Awake/alert Behavior During Therapy: WFL for tasks assessed/performed Overall Cognitive  Status: Within Functional Limits for tasks assessed                                       General Comments  Shortly after receiving IV diaudid pt feeling poorly/nauseated just laying in bed BP was 84/51, later she had multiple readings in supine and sitting with 100s/70s but with checking multiple times during/after standing activities she consistently dropped 70s+/- over 50s+/-            Home Living Family/patient expects to be discharged to:: Private residence Living Arrangements: Spouse/significant other Available Help at Discharge: Family;Available 24 hours/day Type of Home: House Home Access: Stairs to enter [employer] of Steps: 2   Home Layout: One level     Bathroom Shower/Tub: Tub/shower unit         Home Equipment: Agricultural consultant (2 wheels);Shower seat          Prior Functioning/Environment Prior Level of Function : Independent/Modified Independent               ADLs Comments: Independent with ADLs        OT Problem List: Decreased strength;Decreased activity tolerance;Decreased safety awareness;Decreased range of motion      OT Treatment/Interventions: Self-care/ADL training;Therapeutic exercise;Energy conservation;DME and/or AE instruction;Therapeutic activities;Patient/family education;Balance training    OT Goals(Current goals can be found in the care plan section) Acute Rehab OT Goals Patient Stated Goal: To go home OT Goal Formulation: With patient Time For Goal Achievement: 06/13/22 Potential to Achieve Goals: Good ADL Goals Pt Will Perform Grooming: with modified independence;standing (will tolerate >10 minutes) Pt Will Perform Lower Body Dressing: with modified independence;with adaptive equipment;sit to/from stand Pt Will Transfer to Toilet: with modified independence;ambulating;regular height toilet  OT Frequency: Min 2X/week       AM-PAC OT "6 Clicks" Daily Activity     Outcome Measure Help from another  person eating meals?: None Help from another person taking care of personal grooming?: A Little Help from another person toileting, which includes using toliet, bedpan, or urinal?: A Little Help from another person bathing (including washing, rinsing, drying)?: A Little Help from another person to put on and taking off regular upper body clothing?: A Little Help from another person to put on and taking off regular lower body clothing?: A Little 6 Click Score: 19   End of Session Equipment Utilized During Treatment: Gait belt;Rolling walker (2 wheels) Nurse Communication: Patient requests pain meds  Activity Tolerance: Patient tolerated treatment well;Patient limited by pain Patient left: in bed;with call bell/phone within reach;with bed alarm set;with SCD's reapplied (Polar care reapplied)  OT Visit Diagnosis: Unsteadiness on feet (R26.81)                Time: 3559-7416 OT Time Calculation (min): 36 min Charges:  OT General Charges $OT Visit: 1 Visit OT Evaluation $OT  Eval Moderate Complexity: 1 Mod OT Treatments $Self Care/Home Management : 23-37 mins  [employer] Raysha Tilmon, OTDS  [employer] Margarie Mcguirt 05/30/2022, 1:30 PM

## 2022-05-30 NOTE — Progress Notes (Signed)
Physical Therapy Treatment Patient Details Name: Monique Patton MRN: [id number] DOB: 24-Jul-1965 Today's Date: 05/30/2022   History of Present Illness 57 y/o female s/p R TKA 05/29/22; had L TKA 11/22.    PT Comments    Pt motivated to work with PT and as we were able to organize pain meds well she was eager to show some good improvement with today's session.  Unfortunately PT session was limited due to continued severity of pain as well as low BPs with symptomatic lightheadeness, cold-sweats and need to sit.  Pt with multiple BPs with 100s/70s in sitting and during light activity but consistently had drop in O2 with standing and could not tolerate any real prolonged upright effort.  Good effort but much more limited than typical POD1 expectations.    Recommendations for follow up therapy are one component of a multi-disciplinary discharge planning process, led by the attending physician.  Recommendations may be updated based on patient status, additional functional criteria and insurance authorization.  Follow Up Recommendations  Follow physician's recommendations for discharge plan and follow up therapies     Assistance Recommended at Discharge Intermittent Supervision/Assistance  Patient can return home with the following Assistance with cooking/housework;Assist for transportation   Equipment Recommendations  None recommended by PT    Recommendations for Other Services       Precautions / Restrictions Precautions Precautions: Fall;Knee Precaution Booklet Issued: Yes (comment) Restrictions Weight Bearing Restrictions: Yes RLE Weight Bearing: Weight bearing as tolerated     Mobility  Bed Mobility Overal bed mobility: Modified Independent             General bed mobility comments: able to get up to EOB w/o assist, more confident and less UE reliant today    Transfers Overall transfer level: Needs assistance Equipment used: Rolling walker (2 wheels) Transfers: Sit  to/from Stand Sit to Stand: Min guard, Min assist           General transfer comment: Pt motivated to get up from low bed setting, she needed heavy forward lean and UE use and some extra time to get R LE back under her once standing but ultimately did rise w/o direct assist    Ambulation/Gait Ambulation/Gait assistance: Min assist Gait Distance (Feet): 20 Feet Assistive device: Rolling walker (2 wheels)         General Gait Details: Pt continues to struggle with pain, nausea and low BP with most actitiy.  She was able to walk ~15 ft then another 20 ft after seated rest break but each time she had some lightheadedness, cold sweats, drop in BP (generally in the 70-80s over 50-60s) after these relatively brief standing/ambulation efforts.  Pt needing/requesting to sit each time despite clearly being motivated to do more if she were medically able.   Stairs             Wheelchair Mobility    Modified Rankin (Stroke Patients Only)       Balance Overall balance assessment: Modified Independent                                          Cognition Arousal/Alertness: Awake/alert Behavior During Therapy: WFL for tasks assessed/performed Overall Cognitive Status: Within Functional Limits for tasks assessed  Exercises Total Joint Exercises Ankle Circles/Pumps: AROM, 10 reps Quad Sets: Strengthening, 15 reps Short Arc Quad: AAROM, 10 reps (Pt with a lot of pain with this, unable to go through full against gravity range w/o assist.) Heel Slides: AAROM, 5 reps (considerable pain with knee flexion activity) Hip ABduction/ADduction: Strengthening, 10 reps Straight Leg Raises: AAROM, 5 reps (pt unable to lift against gravity) Knee Flexion: PROM, 5 reps Goniometric ROM: 2-78    General Comments General comments (skin integrity, edema, etc.): Shortly after receiving IV diaudid pt feeling poorly/nauseated  just laying in bed BP was 84/51, later she had multiple readings in supine and sitting with 100s/70s but with checking multiple times during/after standing activities she consistently dropped 70s+/- over 50s+/-      Pertinent Vitals/Pain Pain Assessment Pain Assessment: 0-10 Pain Score: 5  (5/10 on arrival, increased significantly with increased activity/ROM) Pain Location: R knee    Home Living                          Prior Function            PT Goals (current goals can now be found in the care plan section) Progress towards PT goals: Progressing toward goals (slow progress 2/2 pain and BP)    Frequency    BID      PT Plan Current plan remains appropriate    Co-evaluation              AM-PAC PT "6 Clicks" Mobility   Outcome Measure  Help needed turning from your back to your side while in a flat bed without using bedrails?: None Help needed moving from lying on your back to sitting on the side of a flat bed without using bedrails?: None Help needed moving to and from a bed to a chair (including a wheelchair)?: A Little Help needed standing up from a chair using your arms (e.g., wheelchair or bedside chair)?: A Little Help needed to walk in hospital room?: A Lot Help needed climbing 3-5 steps with a railing? : A Lot 6 Click Score: 18    End of Session Equipment Utilized During Treatment: Gait belt Activity Tolerance: Patient limited by fatigue;Patient limited by pain Patient left: with chair alarm set;with call bell/phone within reach;with family/visitor present Nurse Communication: Mobility status (low BP, nausea) PT Visit Diagnosis: Muscle weakness (generalized) (M62.81);Difficulty in walking, not elsewhere classified (R26.2);Pain Pain - Right/Left: Right Pain - part of body: Knee     Time: 0830-0928 PT Time Calculation (min) (ACUTE ONLY): 58 min  Charges:  $Gait Training: 8-22 mins $Therapeutic Exercise: 8-22 mins $Therapeutic Activity:  23-37 mins                      Malachi Pro, DPT 05/30/2022, 11:49 AM

## 2022-05-30 NOTE — Progress Notes (Signed)
Met with the patient in the room at the bedside The patient lives at Home with her husband The patient  currently has DME including Rolling walker, cane, and 3 in 1 The patient will not need additional DME They have transportation with her husband They can afford their medication  They are set up with [facility] for Home health services   Admitted for: Right total knee

## 2022-05-30 NOTE — Progress Notes (Signed)
Physical Therapy Treatment Patient Details Name: Monique Patton MRN: [id number] DOB: 09/30/1965 Today's Date: 05/30/2022   History of Present Illness 57 y/o female s/p R TKA 05/29/22; had L TKA 11/22.    PT Comments    Pt still having considerable pain, but clearly feeling much better and ultimately she was able to circumambulate the nurses' station with consistent speed and cadence.  Good overall effort with stable vitals t;o the session.  She continues to have a lot of stiffness/pain hesitation with ROM tasks and struggles with against gravity movement but far and away her best PT session yet.  Hoping to continue improving and be able to discharge tomorrow per continued progress.  Recommendations for follow up therapy are one component of a multi-disciplinary discharge planning process, led by the attending physician.  Recommendations may be updated based on patient status, additional functional criteria and insurance authorization.  Follow Up Recommendations  Follow physician's recommendations for discharge plan and follow up therapies     Assistance Recommended at Discharge Intermittent Supervision/Assistance  Patient can return home with the following Assistance with cooking/housework;Assist for transportation   Equipment Recommendations  None recommended by PT    Recommendations for Other Services       Precautions / Restrictions Precautions Precautions: Fall;Knee Precaution Booklet Issued: Yes (comment) Restrictions Weight Bearing Restrictions: Yes RLE Weight Bearing: Weight bearing as tolerated     Mobility  Bed Mobility Overal bed mobility: Modified Independent             General bed mobility comments: Pt was able to get up to sitting w/o assist, c/o increased pain as R knee bends off EOB    Transfers Overall transfer level: Needs assistance Equipment used: Rolling walker (2 wheels) Transfers: Sit to/from Stand Sit to Stand: Min assist            General transfer comment: Again heavy forward lean with labored effort to get R LE under her once up, but able to rise from standard height bed w/o direct assist    Ambulation/Gait Ambulation/Gait assistance: Supervision Gait Distance (Feet): 200 Feet Assistive device: Rolling walker (2 wheels)         General Gait Details: Pt able to do a prolonged bout of ambulation with consistent cadence and appropriate amount of walker reliance.  Her vitals were stable t/o and though she had R knee pain with WBing she had no limp or signficant hesitation.   Stairs             Wheelchair Mobility    Modified Rankin (Stroke Patients Only)       Balance Overall balance assessment: Modified Independent                                          Cognition Arousal/Alertness: Awake/alert Behavior During Therapy: WFL for tasks assessed/performed Overall Cognitive Status: Within Functional Limits for tasks assessed                                          Exercises Total Joint Exercises Ankle Circles/Pumps: AROM, 10 reps Quad Sets: Strengthening, 15 reps Short Arc Quad: AAROM, 10 reps Heel Slides: AAROM, 5 reps (considerable pain with knee flexion activity) Hip ABduction/ADduction: Strengthening, 10 reps Straight Leg Raises: AAROM, 5 reps (pt unable to  lift against gravity) Knee Flexion: PROM, 5 reps Goniometric ROM: 2-78    General Comments General comments (skin integrity, edema, etc.): Shortly after receiving IV diaudid pt feeling poorly/nauseated just laying in bed BP was 84/51, later she had multiple readings in supine and sitting with 100s/70s but with checking multiple times during/after standing activities she consistently dropped 70s+/- over 50s+/-      Pertinent Vitals/Pain Pain Assessment Pain Assessment: 0-10 Pain Score: 7  Pain Location: R knee    Home Living Family/patient expects to be discharged to:: Private residence Living  Arrangements: Spouse/significant other Available Help at Discharge: Family;Available 24 hours/day Type of Home: House Home Access: Stairs to enter   [employer] of Steps: 2   Home Layout: One level Home Equipment: Agricultural consultant (2 wheels);Shower seat      Prior Function            PT Goals (current goals can now be found in the care plan section) Progress towards PT goals: Progressing toward goals    Frequency    BID      PT Plan Current plan remains appropriate    Co-evaluation              AM-PAC PT "6 Clicks" Mobility   Outcome Measure  Help needed turning from your back to your side while in a flat bed without using bedrails?: None Help needed moving from lying on your back to sitting on the side of a flat bed without using bedrails?: None Help needed moving to and from a bed to a chair (including a wheelchair)?: A Little Help needed standing up from a chair using your arms (e.g., wheelchair or bedside chair)?: A Little Help needed to walk in hospital room?: A Little Help needed climbing 3-5 steps with a railing? : A Little 6 Click Score: 20    End of Session Equipment Utilized During Treatment: Gait belt Activity Tolerance: Patient limited by fatigue;Patient limited by pain Patient left: with chair alarm set;with call bell/phone within reach;with family/visitor present Nurse Communication: Mobility status PT Visit Diagnosis: Muscle weakness (generalized) (M62.81);Difficulty in walking, not elsewhere classified (R26.2);Pain Pain - Right/Left: Right Pain - part of body: Knee     Time: 5631-4970 PT Time Calculation (min) (ACUTE ONLY): 31 min  Charges:  $Gait Training: 8-22 mins $Therapeutic Exercise: 8-22 mins $Therapeutic Activity: 23-37 mins                     Malachi Pro, DPT 05/30/2022, 2:41 PM

## 2022-05-31 DIAGNOSIS — M1711 Unilateral primary osteoarthritis, right knee: Secondary | ICD-10-CM | POA: Diagnosis not present

## 2022-05-31 MED ORDER — DOCUSATE SODIUM 100 MG PO CAPS: 100.0000 mg | ORAL_CAPSULE | Freq: Two times a day (BID) | ORAL | 0 refills | Status: DC

## 2022-05-31 MED ORDER — TRAMADOL HCL 50 MG PO TABS: 50.0000 mg | ORAL_TABLET | Freq: Four times a day (QID) | ORAL | 0 refills | Status: DC | PRN

## 2022-05-31 MED ORDER — ACETAMINOPHEN 500 MG PO TABS: 1000.0000 mg | ORAL_TABLET | Freq: Four times a day (QID) | ORAL | 0 refills | Status: DC

## 2022-05-31 MED ORDER — OXYCODONE HCL 5 MG PO TABS: 5.0000 mg | ORAL_TABLET | ORAL | 0 refills | Status: DC | PRN

## 2022-05-31 MED ORDER — ENOXAPARIN SODIUM 40 MG/0.4ML IJ SOSY: 40.0000 mg | PREFILLED_SYRINGE | INTRAMUSCULAR | 0 refills | Status: DC

## 2022-05-31 MED ORDER — SENNOSIDES-DOCUSATE SODIUM 8.6-50 MG PO TABS: 1.0000 | ORAL_TABLET | Freq: Every evening | ORAL | Status: DC | PRN

## 2022-05-31 MED ORDER — ACETAMINOPHEN 500 MG PO TABS
1000.0000 mg | ORAL_TABLET | Freq: Four times a day (QID) | ORAL | Status: DC
  Administered 2022-05-31: 1000 mg via ORAL
  Filled 2022-05-31: qty 2

## 2022-05-31 MED ORDER — ONDANSETRON HCL 4 MG PO TABS: 4.0000 mg | ORAL_TABLET | Freq: Four times a day (QID) | ORAL | 0 refills | Status: DC | PRN

## 2022-05-31 NOTE — Discharge Summary (Signed)
Physician Discharge Summary  Patient ID: Monique Patton MRN: [id number] DOB/AGE: Dec 09, 1965 57 y.o.  Admit date: 05/29/2022 Discharge date: 05/31/2022  Admission Diagnoses:  S/P TKR (total knee replacement) using cement, right [Z96.651]   Discharge Diagnoses: Patient Active Problem List   Diagnosis Date Noted   S/P TKR (total knee replacement) using cement, right 05/29/2022   S/P TKR (total knee replacement) using cement, left 11/09/2021   Arthritis 03/09/2021   Body mass index (BMI) 50.0-59.9, adult (HCC) 08/02/2020   Full thickness rotator cuff tear 05/21/2019   Impingement syndrome of shoulder region 01/29/2019   Cervicogenic headache 07/16/2017   Lumbago with sciatica 06/15/2016   Vitamin D deficiency 11/19/2014   DDD (degenerative disc disease), cervical 02/09/2014   Displacement of lumbar intervertebral disc without myelopathy 07/17/2013   Cervical disc herniation 02/20/2012   Neck pain 02/12/2012   Back pain 02/12/2012   H/O gastric bypass     Past Medical History:  Diagnosis Date   Allergy    Anemia    Arthritis    Eczema    H/O gastric bypass    Heart murmur    since birth   Persistent headaches    Pre-diabetes    Vitamin D deficiency      Transfusion: none   Consultants (if any):   Discharged Condition: Improved  Hospital Course: Monique Patton is an 57 y.o. female who was admitted 05/29/2022 with a diagnosis of S/P TKR (total knee replacement) using cement, right and went to the operating room on 05/29/2022 and underwent the above named procedures.    Surgeries: Procedure(s): TOTAL KNEE ARTHROPLASTY on 05/29/2022 Patient tolerated the surgery well. Taken to PACU where she was stabilized and then transferred to the orthopedic floor.  Started on Lovenox 30 mg q 12 hrs. Foot pumps applied bilaterally at 80 mm. Heels elevated on bed with rolled towels. No evidence of DVT. Negative Homan. Physical therapy started on day #1 for gait training and transfer. OT  started day #1 for ADL and assisted devices.  Patient's foley was d/c on day #1. Patient's IV was d/c on day #2.  On post op day #2 patient was stable and ready for discharge to home with HHPT.    She was given perioperative antibiotics:  Anti-infectives (From admission, onward)    Start     Dose/Rate Route Frequency Ordered Stop   05/29/22 1800  ceFAZolin (ANCEF) IVPB 2g/100 mL premix        2 g 200 mL/hr over 30 Minutes Intravenous Every 6 hours 05/29/22 1423 05/30/22 0030   05/29/22 0600  ceFAZolin (ANCEF) IVPB 3g/100 mL premix  Status:  Discontinued        3 g 200 mL/hr over 30 Minutes Intravenous On call to O.R. 05/29/22 0044 05/29/22 1415     .  She was given sequential compression devices, early ambulation, and Lovenox TEDs for DVT prophylaxis.  She benefited maximally from the hospital stay and there were no complications.    Recent vital signs:  Vitals:   05/31/22 0555 05/31/22 0824  BP: 138/68 127/67  Pulse: (!) 107 (!) 101  Resp: 18 16  Temp: 99.2 F (37.3 C) 100.1 F (37.8 C)  SpO2: 94% 100%    Recent laboratory studies:  Lab Results  Component Value Date   HGB 9.4 (L) 05/30/2022   HGB 11.4 (L) 05/29/2022   HGB 11.7 (L) 05/17/2022   Lab Results  Component Value Date   WBC 7.9 05/30/2022   PLT 211  05/30/2022   No results found for: INR Lab Results  Component Value Date   NA 135 05/30/2022   K 4.0 05/30/2022   CL 102 05/30/2022   CO2 24 05/30/2022   BUN 14 05/30/2022   CREATININE 0.79 05/30/2022   GLUCOSE 124 (H) 05/30/2022    Discharge Medications:   Allergies as of 05/31/2022       Reactions   Other Itching, Rash   Cat Dander   Fish Allergy Rash   Penicillins Itching, Rash   TOLERATED CEFAZOLIN   Shellfish Allergy Rash        Medication List     TAKE these medications    acetaminophen 500 MG tablet Commonly known as: TYLENOL Take 2 tablets (1,000 mg total) by mouth every 6 (six) hours.   cyclobenzaprine 10 MG  tablet Commonly known as: FLEXERIL Take 10 mg by mouth 3 (three) times daily.   docusate sodium 100 MG capsule Commonly known as: COLACE Take 1 capsule (100 mg total) by mouth 2 (two) times daily.   enoxaparin 40 MG/0.4ML injection Commonly known as: LOVENOX Inject 0.4 mLs (40 mg total) into the skin daily for 14 days.   ferrous sulfate 325 (65 FE) MG tablet Take 325 mg by mouth daily.   levocetirizine 5 MG tablet Commonly known as: XYZAL Take 5 mg by mouth every evening.   ondansetron 4 MG tablet Commonly known as: ZOFRAN Take 1 tablet (4 mg total) by mouth every 6 (six) hours as needed for nausea.   oxyCODONE 5 MG immediate release tablet Commonly known as: Oxy IR/ROXICODONE Take 1-2 tablets (5-10 mg total) by mouth every 4 (four) hours as needed for moderate pain (pain score 4-6).   rosuvastatin 20 MG tablet Commonly known as: CRESTOR Take 20 mg by mouth at bedtime.   senna-docusate 8.6-50 MG tablet Commonly known as: Senokot-S Take 1 tablet by mouth at bedtime as needed for mild constipation.   traMADol 50 MG tablet Commonly known as: ULTRAM Take 1 tablet (50 mg total) by mouth every 6 (six) hours as needed.   Ubrelvy 100 MG Tabs Generic drug: Ubrogepant Take 1 tablet by mouth as needed.   Vitamin D3 125 MCG (5000 UT) Caps Take 5,000 Units by mouth daily.               Durable Medical Equipment  (From admission, onward)           Start     Ordered   05/29/22 1424  DME Walker rolling  Once       Question Answer Comment  Walker: With 5 Inch Wheels   Patient needs a walker to treat with the following condition S/P TKR (total knee replacement) using cement, right      05/29/22 1423   05/29/22 1424  DME 3 n 1  Once        05/29/22 1423   05/29/22 1424  DME Bedside commode  Once       Question:  Patient needs a bedside commode to treat with the following condition  Answer:  S/P TKR (total knee replacement) using cement, right   05/29/22 1423             Diagnostic Studies: DG Knee 1-2 Views Right  Result Date: 05/29/2022 CLINICAL DATA:  Postop right knee replacement EXAM: RIGHT KNEE - 1-2 VIEW COMPARISON:  Knee radiographs 05/08/2010 FINDINGS: Postsurgical changes reflecting right knee arthroplasty are seen. Hardware alignment is within expected limits, without evidence of complication. Skin  staples and surgical tubing are noted. There is expected surrounding postoperative soft tissue swelling and small locules of gas. IMPRESSION: Status post right knee arthroplasty without evidence of immediate complication. Electronically Signed   By: Lesia HausenPeter  Noone M.D.   On: 05/29/2022 13:47    Disposition:      Follow-up Information     Evon SlackGaines, Gaberial Cada C, PA-C Follow up in 2 week(s).   Specialties: Orthopedic Surgery, Emergency Medicine Contact information: [address] [place] [address] [phone]                  Signed: Patience MuscaGAINES, Brice Kossman CHRISTOPHER 05/31/2022, 11:24 AM

## 2022-05-31 NOTE — Discharge Instructions (Signed)

## 2022-05-31 NOTE — Progress Notes (Signed)
   Subjective: 2 Days Post-Op Procedure(s) (LRB): TOTAL KNEE ARTHROPLASTY (Right) Patient reports pain as mild Patient is well, and has had no acute complaints or problems Denies any CP, SOB, ABD pain. We will continue therapy today.  Plan is to go Home after hospital stay.  Objective: Vital signs in last 24 hours: Temp:  [98.3 F (36.8 C)-100.1 F (37.8 C)] 100.1 F (37.8 C) (06/01 0824) Pulse Rate:  [101-109] 101 (06/01 0824) Resp:  [16-20] 16 (06/01 0824) BP: (127-138)/(67-73) 127/67 (06/01 0824) SpO2:  [93 %-100 %] 100 % (06/01 0824)  Intake/Output from previous day: 05/31 0701 - 06/01 0700 In: 120 [P.O.:120] Out: -  Intake/Output this shift: No intake/output data recorded.  Recent Labs    05/29/22 1455 05/30/22 0830  HGB 11.4* 9.4*   Recent Labs    05/29/22 1455 05/30/22 0830  WBC 6.1 7.9  RBC 4.35 3.52*  HCT 36.5 29.0*  PLT 197 211   Recent Labs    05/29/22 1455 05/30/22 0830  NA  --  135  K  --  4.0  CL  --  102  CO2  --  24  BUN  --  14  CREATININE 0.81 0.79  GLUCOSE  --  124*  CALCIUM  --  8.6*   No results for input(s): LABPT, INR in the last 72 hours.  EXAM General - Patient is Alert, Appropriate, and Oriented Extremity - Neurovascular intact Sensation intact distally Intact pulses distally Dorsiflexion/Plantar flexion intact Dressing - dressing C/D/I, provena intact with 150 cc drainage Motor Function - intact, moving foot and toes well on exam.   Past Medical History:  Diagnosis Date   Allergy    Anemia    Arthritis    Eczema    H/O gastric bypass    Heart murmur    since birth   Persistent headaches    Pre-diabetes    Vitamin D deficiency     Assessment/Plan:   2 Days Post-Op Procedure(s) (LRB): TOTAL KNEE ARTHROPLASTY (Right) Principal Problem:   S/P TKR (total knee replacement) using cement, right  Estimated body mass index is 45.19 kg/m as calculated from the following:   Height as of this encounter: 5\' 6"   (1.676 m).   Weight as of this encounter: 127 kg. Advance diet Up with therapy Continue to work on bowel movement VSS, blood pressure improved Labs are stable Pain well controlled CM to assist with discharge to home with HHPT today.  DVT Prophylaxis - Lovenox, TED hose, and SCDs Weight-Bearing as tolerated to right leg   T. Rachelle Hora, PA-C [place] 05/31/2022, 11:30 AM

## 2022-05-31 NOTE — Progress Notes (Signed)
Physical Therapy Treatment Patient Details Name: Monique Patton MRN: [id number] DOB: Aug 01, 1965 Today's Date: 05/31/2022   History of Present Illness 57 y/o female s/p R TKA 05/29/22; had L TKA 11/22.    PT Comments    Pt continues to have considerable pain that does limit her mobility/tolerance; never the less she did show good effort and was able to safely ambulate ~300 ft and negotiate up/down steps w/o physical assist.  She still struggles with against gravity R LE acts (no SLRs or even SAQ w/o assist).  Pt making slow but steady gains, eager to go home.   Recommendations for follow up therapy are one component of a multi-disciplinary discharge planning process, led by the attending physician.  Recommendations may be updated based on patient status, additional functional criteria and insurance authorization.  Follow Up Recommendations  Follow physician's recommendations for discharge plan and follow up therapies     Assistance Recommended at Discharge Intermittent Supervision/Assistance  Patient can return home with the following Assistance with cooking/housework;Assist for transportation   Equipment Recommendations  None recommended by PT    Recommendations for Other Services       Precautions / Restrictions Precautions Precautions: Fall;Knee Precaution Booklet Issued: Yes (comment) Restrictions Weight Bearing Restrictions: Yes RLE Weight Bearing: Weight bearing as tolerated     Mobility  Bed Mobility Overal bed mobility: Needs Assistance Bed Mobility: Sit to Supine       Sit to supine: Min assist (light assist to get R LE back up into bed)        Transfers Overall transfer level: Needs assistance Equipment used: Rolling walker (2 wheels) Transfers: Sit to/from Stand Sit to Stand: Min guard           General transfer comment: able to rise w/o direct assist, per her ususal heavy forward lean and UE reliance    Ambulation/Gait Ambulation/Gait assistance:  Supervision Gait Distance (Feet): 300 Feet Assistive device: Rolling walker (2 wheels)         General Gait Details: Prolonged bout of ambulation with consistent cadence and appropriate amount of walker reliance.  Her vitals were stable t/o and though she had R knee pain with WBing she had only limp/hesitation.  HR up to 130s with the effort   Stairs Stairs: Yes Stairs assistance: Min guard Stair Management: One rail Left, Sideways, Step to pattern Number of Stairs: 4 General stair comments: Pt able to negotiate steps with single L rail, initially struggled to cross mid line with lead leg but with cuing and further explanation she did manage to negotiate up/down w/o phyiscal assist   Wheelchair Mobility    Modified Rankin (Stroke Patients Only)       Balance Overall balance assessment: Modified Independent                                          Cognition Arousal/Alertness: Awake/alert Behavior During Therapy: WFL for tasks assessed/performed Overall Cognitive Status: Within Functional Limits for tasks assessed                                          Exercises Total Joint Exercises Quad Sets: Strengthening, 10 reps Short Arc Quad: 10 reps, AAROM (pt struggles with any against gravity motion) Heel Slides: AAROM, 5 reps Hip ABduction/ADduction: Strengthening,  10 reps Straight Leg Raises: AAROM, 5 reps (still unable to lift actively) Knee Flexion: PROM, 5 reps Goniometric ROM: 2-73    General Comments General comments (skin integrity, edema, etc.): Pt continues to have pain that does limit her, but she remains highly motivated to do as much as she can      Pertinent Vitals/Pain Pain Assessment Pain Assessment: 0-10 Pain Score: 6  Pain Location: R knee    Home Living                          Prior Function            PT Goals (current goals can now be found in the care plan section) Progress towards PT goals:  Progressing toward goals    Frequency    BID      PT Plan Current plan remains appropriate    Co-evaluation              AM-PAC PT "6 Clicks" Mobility   Outcome Measure  Help needed turning from your back to your side while in a flat bed without using bedrails?: None Help needed moving from lying on your back to sitting on the side of a flat bed without using bedrails?: None Help needed moving to and from a bed to a chair (including a wheelchair)?: A Little Help needed standing up from a chair using your arms (e.g., wheelchair or bedside chair)?: A Little Help needed to walk in hospital room?: A Little Help needed climbing 3-5 steps with a railing? : A Little 6 Click Score: 20    End of Session Equipment Utilized During Treatment: Gait belt Activity Tolerance: Patient limited by pain Patient left: with chair alarm set;with call bell/phone within reach;with family/visitor present Nurse Communication: Mobility status PT Visit Diagnosis: Muscle weakness (generalized) (M62.81);Difficulty in walking, not elsewhere classified (R26.2);Pain Pain - Right/Left: Right Pain - part of body: Knee     Time: 7106-2694 PT Time Calculation (min) (ACUTE ONLY): 57 min  Charges:  $Gait Training: 23-37 mins $Therapeutic Exercise: 8-22 mins $Therapeutic Activity: 8-22 mins                     Malachi Pro, DPT 05/31/2022, 12:08 PM

## 2022-05-31 NOTE — Progress Notes (Signed)
Discharge instructions reviewed with pt. She verbalized understanding of instructions. Extra honeycomb dsg and packet given to pt.

## 2022-06-04 ENCOUNTER — Other Ambulatory Visit: Payer: Self-pay | Admitting: Orthopedic Surgery

## 2022-06-04 ENCOUNTER — Ambulatory Visit
Admission: RE | Admit: 2022-06-04 | Discharge: 2022-06-04 | Disposition: A | Discharge status: Home / Self Care | Payer: BC Managed Care – PPO | Source: Ambulatory Visit | Attending: Orthopedic Surgery | Admitting: Orthopedic Surgery

## 2022-06-04 DIAGNOSIS — M7989 Other specified soft tissue disorders: Secondary | ICD-10-CM | POA: Diagnosis present

## 2022-06-04 DIAGNOSIS — M79661 Pain in right lower leg: Secondary | ICD-10-CM | POA: Diagnosis present

## 2022-06-04 DIAGNOSIS — I824Z1 Acute embolism and thrombosis of unspecified deep veins of right distal lower extremity: Secondary | ICD-10-CM

## 2022-07-06 ENCOUNTER — Other Ambulatory Visit: Payer: Self-pay | Admitting: Nurse Practitioner

## 2022-07-06 DIAGNOSIS — Z1231 Encounter for screening mammogram for malignant neoplasm of breast: Secondary | ICD-10-CM

## 2022-07-31 ENCOUNTER — Ambulatory Visit
Admission: RE | Admit: 2022-07-31 | Discharge: 2022-07-31 | Disposition: A | Discharge status: Home / Self Care | Payer: BC Managed Care – PPO | Source: Ambulatory Visit | Attending: Nurse Practitioner | Admitting: Nurse Practitioner

## 2022-07-31 DIAGNOSIS — Z1231 Encounter for screening mammogram for malignant neoplasm of breast: Secondary | ICD-10-CM | POA: Diagnosis not present

## 2022-09-18 ENCOUNTER — Other Ambulatory Visit: Payer: Self-pay | Admitting: Orthopedic Surgery

## 2022-09-18 DIAGNOSIS — M4807 Spinal stenosis, lumbosacral region: Secondary | ICD-10-CM

## 2022-10-01 ENCOUNTER — Ambulatory Visit
Admission: RE | Admit: 2022-10-01 | Discharge: 2022-10-01 | Disposition: A | Discharge status: Home / Self Care | Payer: BC Managed Care – PPO | Source: Ambulatory Visit | Attending: Orthopedic Surgery | Admitting: Orthopedic Surgery

## 2022-10-01 DIAGNOSIS — M4807 Spinal stenosis, lumbosacral region: Secondary | ICD-10-CM

## 2023-01-30 IMAGING — DX DG KNEE 1-2V*R*
2 series · 2 of 2 positions shown · non-contrast
Comparison: Knee radiographs 05/08/2010

CLINICAL DATA: Postop right knee replacement

EXAM:
RIGHT KNEE - 1-2 VIEW

[knee ap]
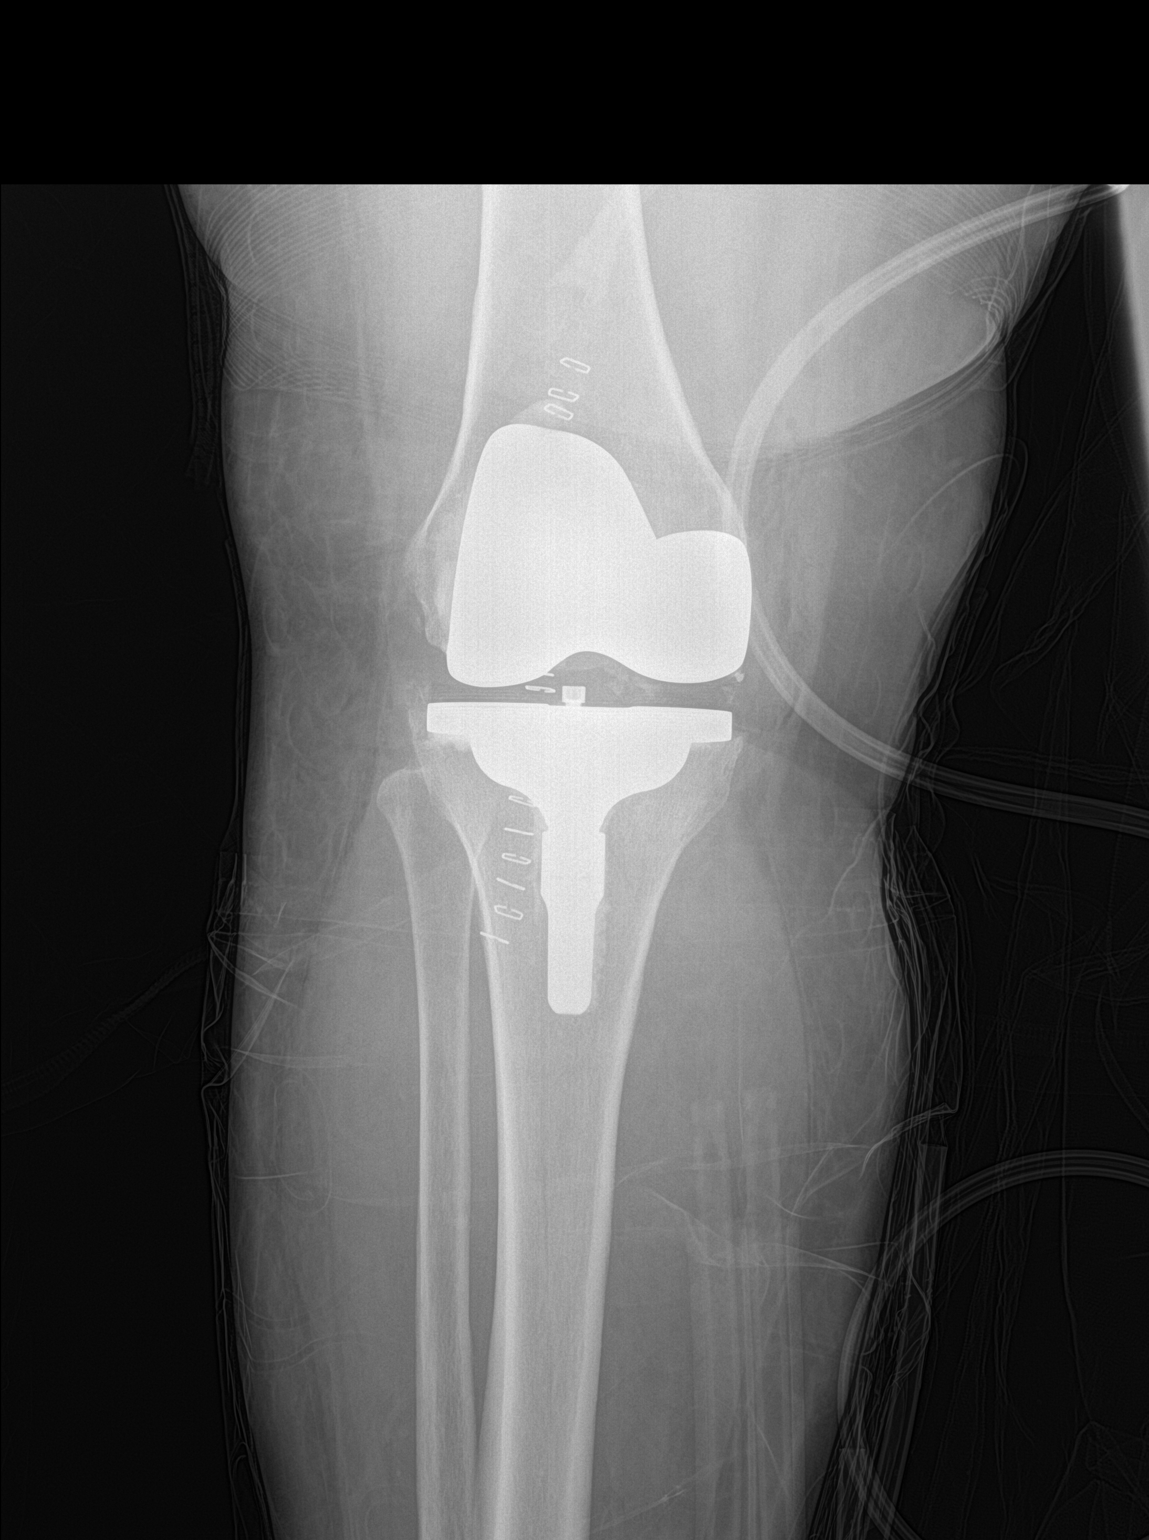

[knee lat]
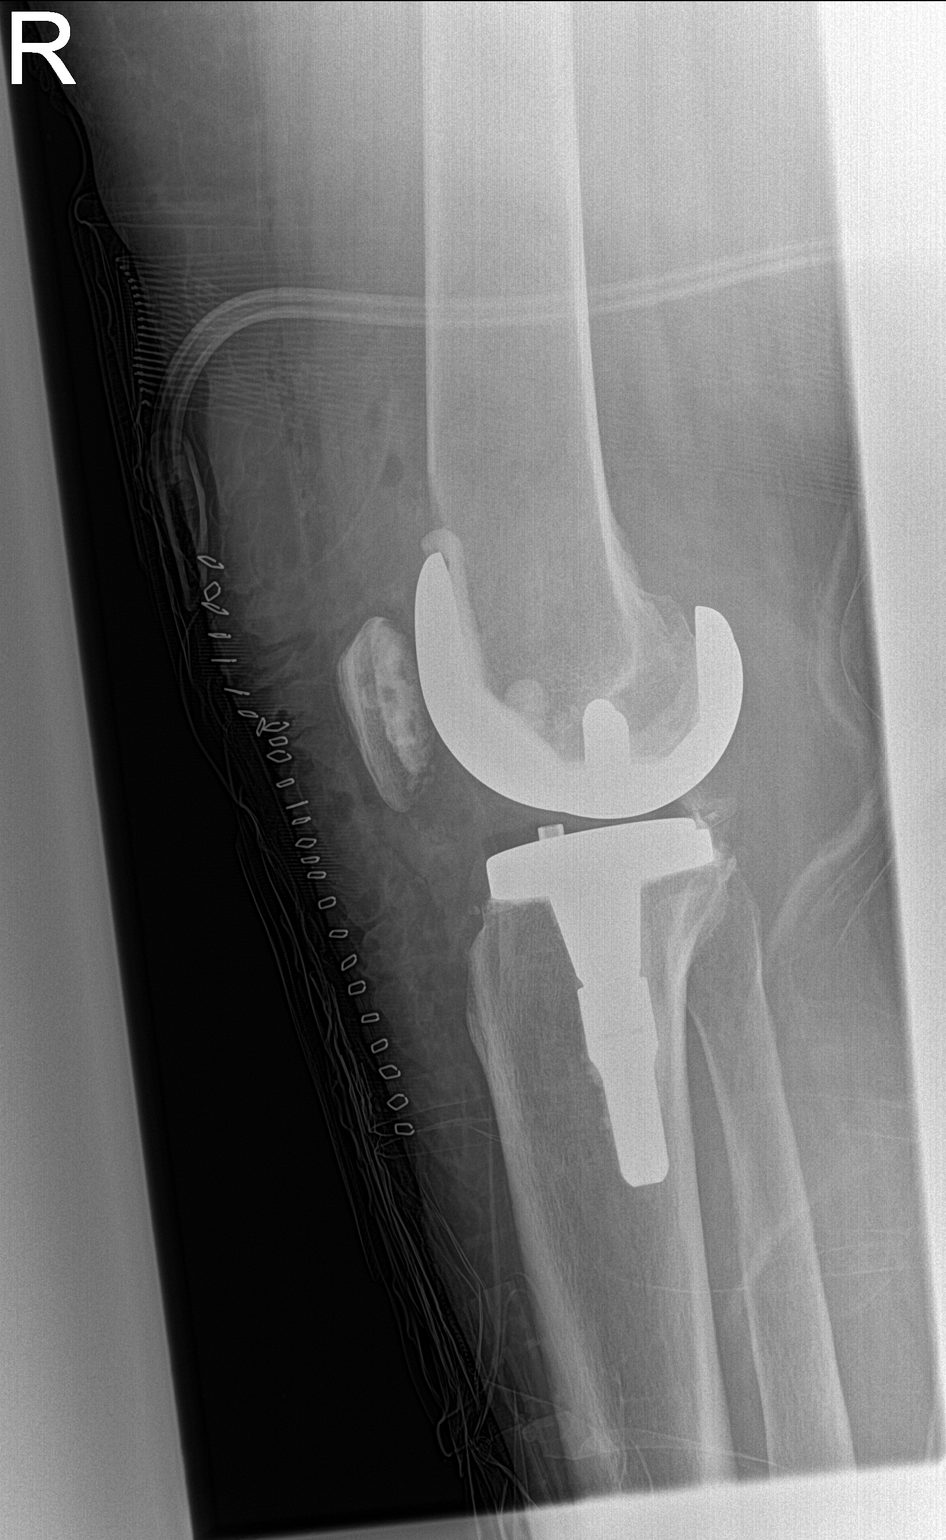

[2 of 2 positions shown; findings below may reference images not displayed]

FINDINGS: Postsurgical changes reflecting right knee arthroplasty are seen.
Hardware alignment is within expected limits, without evidence of
complication. Skin staples and surgical tubing are noted. There is
expected surrounding postoperative soft tissue swelling and small
locules of gas.
IMPRESSION: Status post right knee arthroplasty without evidence of immediate
complication.

## 2023-02-05 IMAGING — US US EXTREM LOW VENOUS*R*
1 series · 13 of 24 positions shown · non-contrast
Comparison: None Available.

CLINICAL DATA: Pain and swelling of right lower extremity.

EXAM:
Right LOWER EXTREMITY VENOUS DOPPLER ULTRASOUND
TECHNIQUE: Gray-scale sonography with compression, as well as color and duplex
ultrasound, were performed to evaluate the deep venous system(s)
from the level of the common femoral vein through the popliteal and
proximal calf veins.

[Series 1: us venous img lower uni right (dvt) · portal-venous · 13 of 45 slices shown]
[im 1/45]
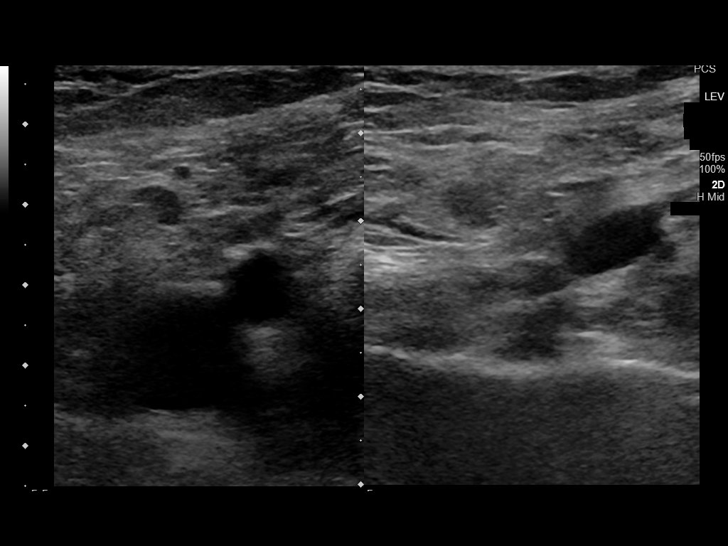
[im 4/45]
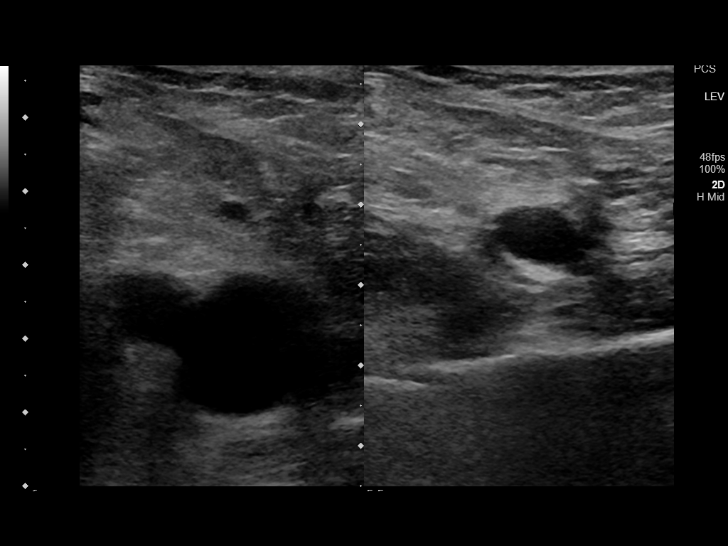
[im 8/45]
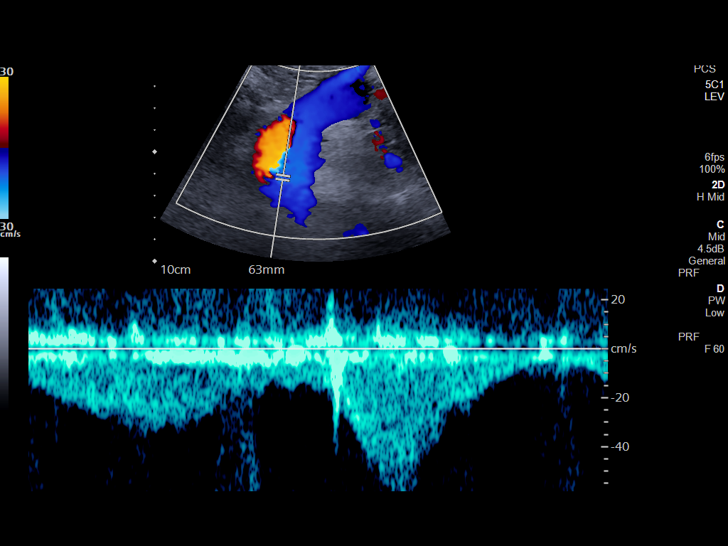
[im 12/45]
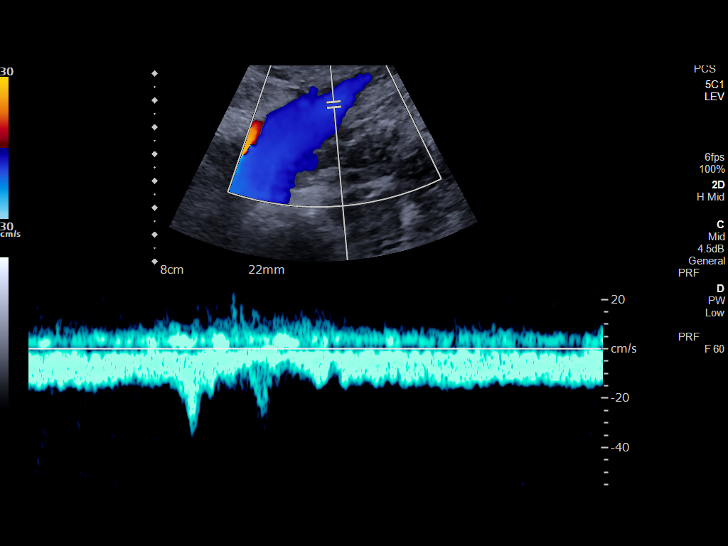
[im 16/45]
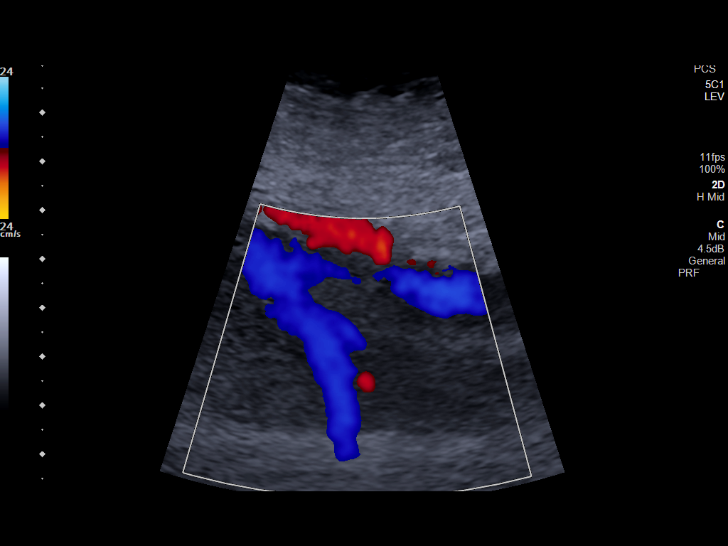
[im 20/45]
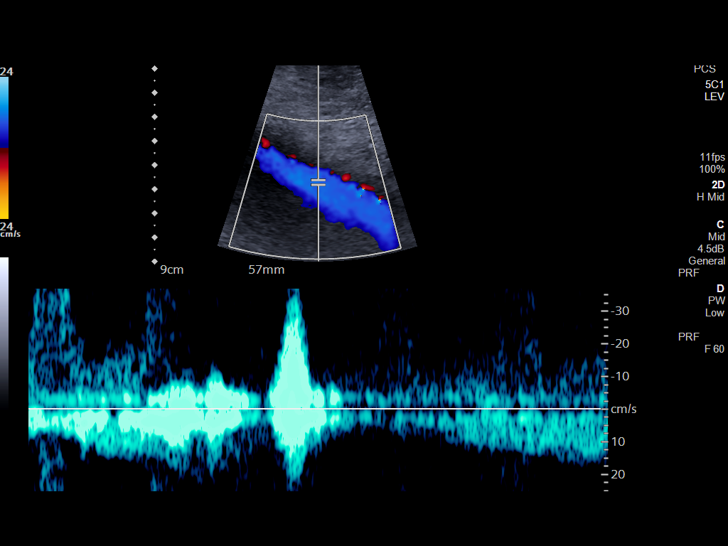
[im 23/45]
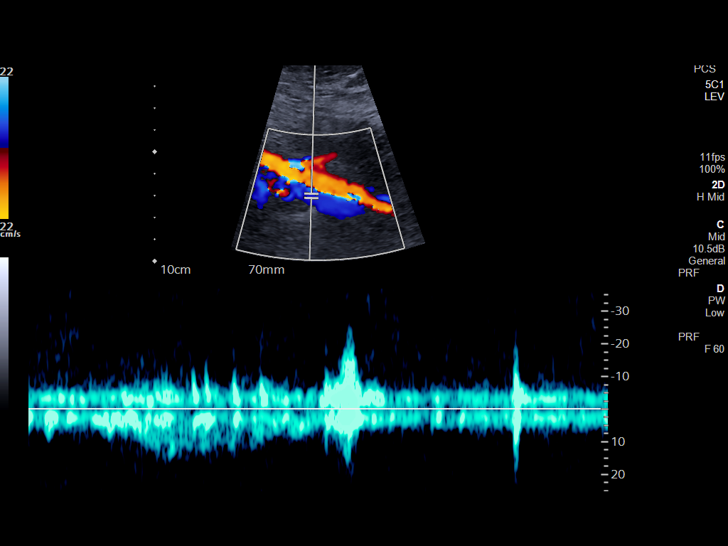
[im 25/45]
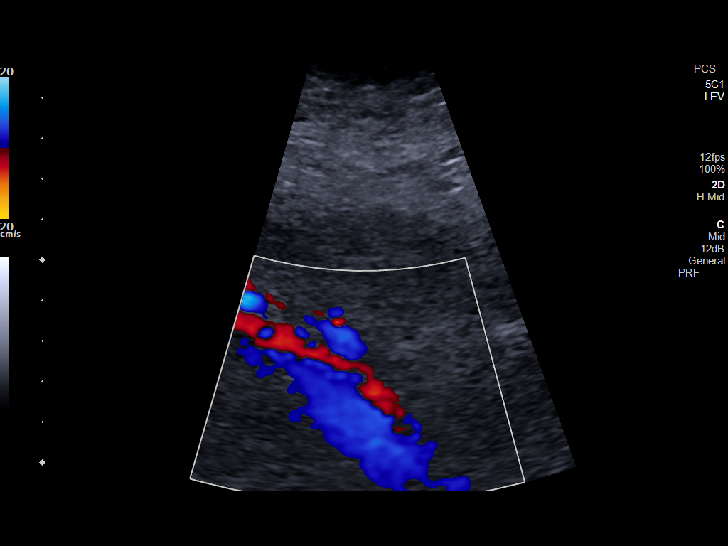
[im 29/45]
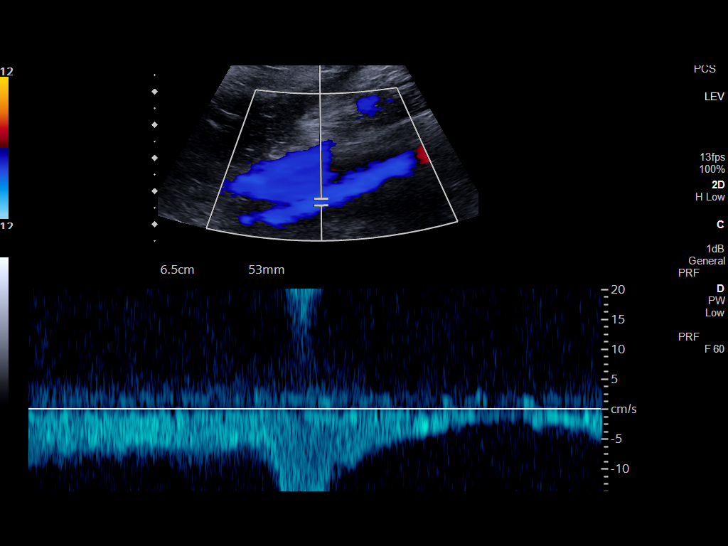
[im 33/45]
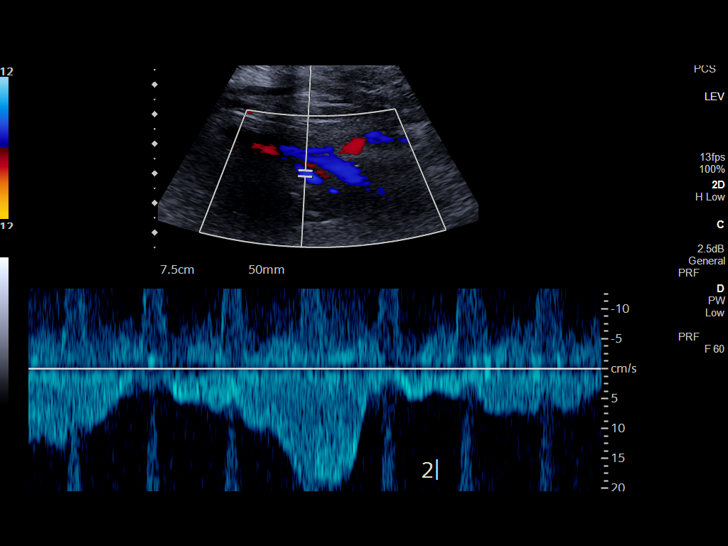
[im 37/45]
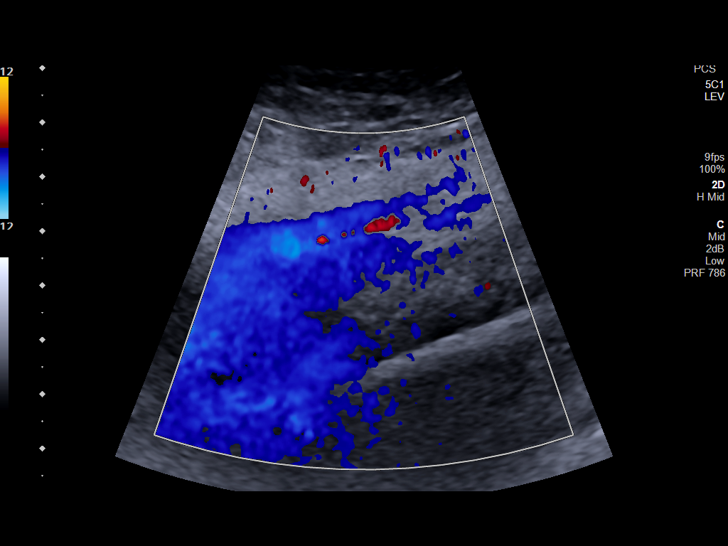
[im 41/45]
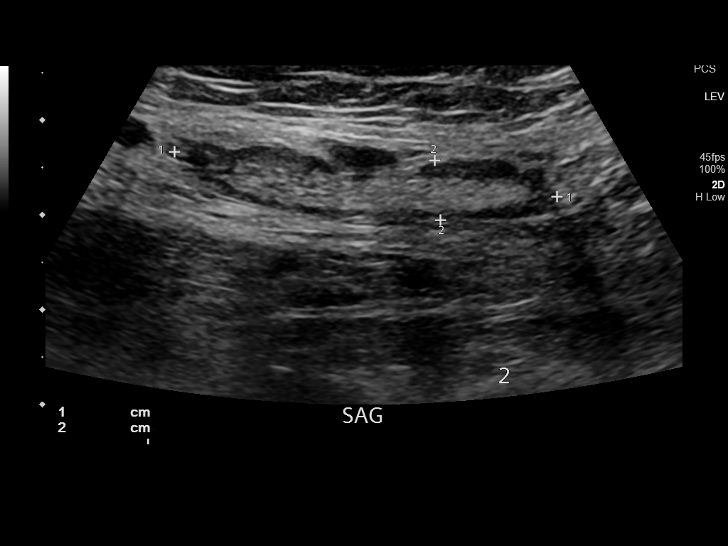
[im 45/45]
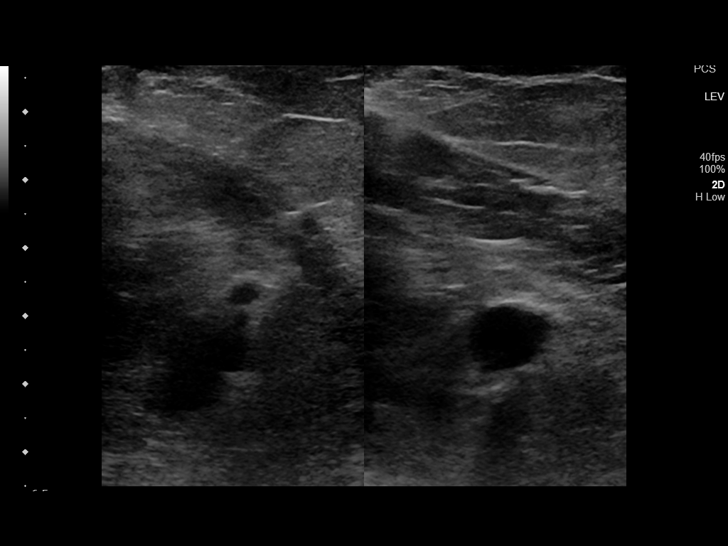

[13 of 24 positions shown; findings below may reference images not displayed]

FINDINGS: VENOUS

Normal compressibility of the common femoral, superficial femoral,
and popliteal veins, as well as the visualized calf veins.
Visualized portions of profunda femoral vein and great saphenous
vein unremarkable. No filling defects to suggest DVT on grayscale or
color Doppler imaging. Doppler waveforms show normal direction of
venous flow, normal respiratory plasticity and response to
augmentation.

Limited views of the contralateral common femoral vein are
unremarkable.

OTHER

None.

Limitations: Prominent right inguinal lymph nodes measure up to
cm in short axis with a preserved fatty hilum, commonly reactive.
IMPRESSION: No evidence of deep venous thrombosis.

Prominent right inguinal lymph nodes measure up to 1.7 cm in short
axis with preserved fatty hilum, commonly reactive.

## 2023-02-12 ENCOUNTER — Other Ambulatory Visit: Payer: Self-pay | Admitting: Nurse Practitioner

## 2023-04-17 ENCOUNTER — Other Ambulatory Visit: Payer: BC Managed Care – PPO

## 2023-04-17 DIAGNOSIS — E785 Hyperlipidemia, unspecified: Secondary | ICD-10-CM

## 2023-04-17 DIAGNOSIS — I10 Essential (primary) hypertension: Secondary | ICD-10-CM

## 2023-04-17 DIAGNOSIS — E119 Type 2 diabetes mellitus without complications: Secondary | ICD-10-CM

## 2023-04-18 LAB — LIPID PANEL
Chol/HDL Ratio: 1.9 ratio (ref 0.0–4.4)
Cholesterol, Total: 123 mg/dL (ref 100–199)
HDL: 66 mg/dL (ref >39)
LDL Chol Calc (NIH): 45 mg/dL (ref 0–99)
Triglycerides: 55 mg/dL (ref 0–149)
VLDL Cholesterol Cal: 12 mg/dL (ref 5–40)

## 2023-04-18 LAB — CMP14+EGFR
ALT: 12 IU/L (ref 0–32)
AST: 16 IU/L (ref 0–40)
Albumin/Globulin Ratio: 1.9 (ref 1.2–2.2)
Albumin: 3.9 g/dL (ref 3.8–4.9)
Alkaline Phosphatase: 98 IU/L (ref 44–121)
BUN/Creatinine Ratio: 18 (ref 9–23)
BUN: 16 mg/dL (ref 6–24)
Bilirubin Total: 0.4 mg/dL (ref 0.0–1.2)
CO2: 25 mmol/L (ref 20–29)
Calcium: 9.4 mg/dL (ref 8.7–10.2)
Chloride: 107 mmol/L — ABNORMAL HIGH (ref 96–106)
Creatinine, Ser: 0.89 mg/dL (ref 0.57–1.00)
Globulin, Total: 2.1 g/dL (ref 1.5–4.5)
Glucose: 86 mg/dL (ref 70–99)
Potassium: 4.6 mmol/L (ref 3.5–5.2)
Sodium: 145 mmol/L — ABNORMAL HIGH (ref 134–144)
Total Protein: 6 g/dL (ref 6.0–8.5)
eGFR: 75 mL/min/{1.73_m2} (ref >59)

## 2023-04-18 LAB — CBC WITH DIFFERENTIAL
Basophils Absolute: 0.1 10*3/uL (ref 0.0–0.2)
Basos: 1 %
EOS (ABSOLUTE): 0.3 10*3/uL (ref 0.0–0.4)
Eos: 5 %
Hematocrit: 38.2 % (ref 34.0–46.6)
Hemoglobin: 12.2 g/dL (ref 11.1–15.9)
Immature Grans (Abs): 0 10*3/uL (ref 0.0–0.1)
Immature Granulocytes: 0 %
Lymphocytes Absolute: 2.4 10*3/uL (ref 0.7–3.1)
Lymphs: 47 %
MCH: 27 pg (ref 26.6–33.0)
MCHC: 31.9 g/dL (ref 31.5–35.7)
MCV: 85 fL (ref 79–97)
Monocytes Absolute: 0.5 10*3/uL (ref 0.1–0.9)
Monocytes: 9 %
Neutrophils Absolute: 2 10*3/uL (ref 1.4–7.0)
Neutrophils: 38 %
RBC: 4.52 x10E6/uL (ref 3.77–5.28)
RDW: 14 % (ref 11.7–15.4)
WBC: 5.2 10*3/uL (ref 3.4–10.8)

## 2023-04-18 LAB — TSH: TSH: 1.75 u[IU]/mL (ref 0.450–4.500)

## 2023-04-18 LAB — HEMOGLOBIN A1C
Est. average glucose Bld gHb Est-mCnc: 123 mg/dL
Hgb A1c MFr Bld: 5.9 % — ABNORMAL HIGH (ref 4.8–5.6)

## 2023-04-22 ENCOUNTER — Encounter: Payer: Self-pay | Admitting: Nurse Practitioner

## 2023-04-22 ENCOUNTER — Ambulatory Visit (INDEPENDENT_AMBULATORY_CARE_PROVIDER_SITE_OTHER): Payer: BC Managed Care – PPO | Admitting: Nurse Practitioner

## 2023-04-22 VITALS — BP 124/76 | HR 100 | Ht 66.0 in | Wt 275.0 lb

## 2023-04-22 DIAGNOSIS — G43809 Other migraine, not intractable, without status migrainosus: Secondary | ICD-10-CM

## 2023-04-22 DIAGNOSIS — R7303 Prediabetes: Secondary | ICD-10-CM

## 2023-04-22 DIAGNOSIS — E782 Mixed hyperlipidemia: Secondary | ICD-10-CM

## 2023-04-22 DIAGNOSIS — G43909 Migraine, unspecified, not intractable, without status migrainosus: Secondary | ICD-10-CM | POA: Insufficient documentation

## 2023-04-22 DIAGNOSIS — J301 Allergic rhinitis due to pollen: Secondary | ICD-10-CM | POA: Insufficient documentation

## 2023-04-22 MED ORDER — VITAMIN D3 125 MCG (5000 UT) PO CAPS: 5000.0000 [IU] | ORAL_CAPSULE | Freq: Every day | ORAL | 3 refills | Status: DC

## 2023-04-22 MED ORDER — FERROUS SULFATE 325 (65 FE) MG PO TABS: 325.0000 mg | ORAL_TABLET | Freq: Every day | ORAL | 3 refills | Status: DC

## 2023-04-22 MED ORDER — MOUNJARO 7.5 MG/0.5ML ~~LOC~~ SOAJ
7.5000 mg | SUBCUTANEOUS | 3 refills | Status: DC
Start: 2023-04-22 — End: 2023-05-20

## 2023-04-22 NOTE — Progress Notes (Signed)
Established Patient Office Visit  Subjective:  Patient ID: Monique Patton, female    DOB: 1965/07/23  Age: 58 y.o. MRN: [id number]  Chief Complaint  Patient presents with   Follow-up    3 month follow up, discuss lab results.    3 month follow up and review fasting labs    No other concerns at this time.   Past Medical History:  Diagnosis Date   Allergy    Anemia    Arthritis    Eczema    H/O gastric bypass    Heart murmur    since birth   Persistent headaches    Pre-diabetes    Vitamin D deficiency     Past Surgical History:  Procedure Laterality Date   ABDOMINAL HYSTERECTOMY     ADENIDECTOMY     ANTERIOR CERVICAL DECOMP/DISCECTOMY FUSION  03/13/2012   Procedure: ANTERIOR CERVICAL DECOMPRESSION/DISCECTOMY FUSION 2 LEVELS;  Surgeon: Hewitt Shorts, MD;  Location: [facility];  Service: Neurosurgery;  Laterality: N/A;  Cervical Five-Six Cervical Six-Seven Anterior Cervical Decompression with fusion plating and bonegraft   COLONOSCOPY WITH PROPOFOL N/A 01/07/2018   Procedure: COLONOSCOPY WITH PROPOFOL;  Surgeon: Toledo, Boykin Nearing, MD;  Location: [facility];  Service: Gastroenterology;  Laterality: N/A;   GASTRIC BYPASS  2003   LUMBAR LAMINECTOMY/DECOMPRESSION MICRODISCECTOMY Left 05/14/2013   Procedure: LUMBAR LAMINECTOMY/DECOMPRESSION MICRODISCECTOMY 1 LEVEL;  Surgeon: Hewitt Shorts, MD;  Location: [facility];  Service: Neurosurgery;  Laterality: Left;  LEFT Lumbar four-five4 extraforaminal microdiskectomy   POSTERIOR CERVICAL FUSION/FORAMINOTOMY N/A 11/05/2013   Procedure: Cervical five-six posterior arthrodesis and instrumentation;  Surgeon: Hewitt Shorts, MD;  Location: [facility];  Service: Neurosurgery;  Laterality: N/A;  Cervical five-six posterior arthrodesis and instrumentation   TONSILLECTOMY     TOTAL KNEE ARTHROPLASTY Left 11/09/2021   Procedure: TOTAL KNEE ARTHROPLASTY;  Surgeon: Kennedy Bucker, MD;  Location: [facility];  Service:  Orthopedics;  Laterality: Left;   TOTAL KNEE ARTHROPLASTY Right 05/29/2022   Procedure: TOTAL KNEE ARTHROPLASTY;  Surgeon: Kennedy Bucker, MD;  Location: [facility];  Service: Orthopedics;  Laterality: Right;    Social History   Socioeconomic History   Marital status: Married    Spouse name: Onalee Hua   Number of children: 1   Years of education: Not on file   Highest education level: Not on file  Occupational History   Not on file  Tobacco Use   Smoking status: Former    Packs/day: 0.10    Years: 35.00    Additional pack years: 0.00    Total pack years: 3.50    Types: Cigarettes    Quit date: 01/07/2010    Years since quitting: 13.2   Smokeless tobacco: Never  Vaping Use   Vaping Use: Never used  Substance and Sexual Activity   Alcohol use: No   Drug use: No   Sexual activity: Not on file  Other Topics Concern   Not on file  Social History Narrative   Not on file   Social Determinants of Health   Financial Resource Strain: Not on file  Food Insecurity: Not on file  Transportation Needs: Not on file  Physical Activity: Not on file  Stress: Not on file  Social Connections: Not on file  Intimate Partner Violence: Not on file    Family History  Problem Relation Age of Onset   Hyperlipidemia Father    Hypertension Father    Diabetes Father    Breast cancer Neg Hx  Allergies  Allergen Reactions   Other Itching and Rash    Cat Dander   Fish Allergy Rash   Penicillins Itching and Rash    TOLERATED CEFAZOLIN   Shellfish Allergy Rash    Review of Systems  Constitutional: Negative.   HENT: Negative.    Eyes: Negative.   Respiratory: Negative.    Cardiovascular: Negative.   Gastrointestinal: Negative.   Genitourinary: Negative.   Musculoskeletal: Negative.   Skin: Negative.   Neurological: Negative.   Endo/Heme/Allergies: Negative.   Psychiatric/Behavioral: Negative.         Objective:   BP 124/76   Pulse 100   Ht  (1.676 m)   Wt 275 lb (124.7  kg)   SpO2 98%   BMI 44.39 kg/m   Vitals:   04/22/23 1410  BP: 124/76  Pulse: 100  Height:  (1.676 m)  Weight: 275 lb (124.7 kg)  SpO2: 98%  BMI (Calculated): 44.41    Physical Exam Vitals reviewed.  Constitutional:      Appearance: Normal appearance.  HENT:     Head: Normocephalic.     Nose: Nose normal.     Mouth/Throat:     Mouth: Mucous membranes are moist.  Eyes:     Pupils: Pupils are equal, round, and reactive to light.  Cardiovascular:     Rate and Rhythm: Normal rate and regular rhythm.  Pulmonary:     Effort: Pulmonary effort is normal.     Breath sounds: Normal breath sounds.  Abdominal:     General: Bowel sounds are normal.     Palpations: Abdomen is soft.  Musculoskeletal:        General: Normal range of motion.     Cervical back: Normal range of motion and neck supple.  Skin:    General: Skin is warm and dry.  Neurological:     Mental Status: She is alert and oriented to person, place, and time.  Psychiatric:        Mood and Affect: Mood normal.        Behavior: Behavior normal.      No results found for any visits on 04/22/23.  Recent Results (from the past 2160 hour(s))  Hemoglobin A1c     Status: Abnormal   Collection Time: 04/17/23  9:18 AM  Result Value Ref Range   Hgb A1c MFr Bld 5.9 (H) 4.8 - 5.6 %    Comment:          Prediabetes: 5.7 - 6.4          Diabetes: >6.4          Glycemic control for adults with diabetes: <7.0    Est. average glucose Bld gHb Est-mCnc 123 mg/dL  TSH     Status: None   Collection Time: 04/17/23  9:18 AM  Result Value Ref Range   TSH 1.750 0.450 - 4.500 uIU/mL  CMP14+EGFR     Status: Abnormal   Collection Time: 04/17/23  9:18 AM  Result Value Ref Range   Glucose 86 70 - 99 mg/dL   BUN 16 6 - 24 mg/dL   Creatinine, Ser 3.66 0.57 - 1.00 mg/dL   eGFR 75 >44 IH/KVQ/2.59   BUN/Creatinine Ratio 18 9 - 23   Sodium 145 (H) 134 - 144 mmol/L   Potassium 4.6 3.5 - 5.2 mmol/L   Chloride 107 (H) 96 - 106  mmol/L   CO2 25 20 - 29 mmol/L   Calcium 9.4 8.7 - 10.2 mg/dL  Total Protein 6.0 6.0 - 8.5 g/dL   Albumin 3.9 3.8 - 4.9 g/dL   Globulin, Total 2.1 1.5 - 4.5 g/dL   Albumin/Globulin Ratio 1.9 1.2 - 2.2   Bilirubin Total 0.4 0.0 - 1.2 mg/dL   Alkaline Phosphatase 98 44 - 121 IU/L   AST 16 0 - 40 IU/L   ALT 12 0 - 32 IU/L  Lipid panel     Status: None   Collection Time: 04/17/23  9:18 AM  Result Value Ref Range   Cholesterol, Total 123 100 - 199 mg/dL   Triglycerides 55 0 - 149 mg/dL   HDL 66 >16 mg/dL   VLDL Cholesterol Cal 12 5 - 40 mg/dL   LDL Chol Calc (NIH) 45 0 - 99 mg/dL   Chol/HDL Ratio 1.9 0.0 - 4.4 ratio    Comment:                                   T. Chol/HDL Ratio                                             Men  Women                               1/2 Avg.Risk  3.4    3.3                                   Avg.Risk  5.0    4.4                                2X Avg.Risk  9.6    7.1                                3X Avg.Risk 23.4   11.0   CBC With Differential     Status: None   Collection Time: 04/17/23  9:18 AM  Result Value Ref Range   WBC 5.2 3.4 - 10.8 x10E3/uL   RBC 4.52 3.77 - 5.28 x10E6/uL   Hemoglobin 12.2 11.1 - 15.9 g/dL   Hematocrit 10.9 60.4 - 46.6 %   MCV 85 79 - 97 fL   MCH 27.0 26.6 - 33.0 pg   MCHC 31.9 31.5 - 35.7 g/dL   RDW 54.0 98.1 - 19.1 %   Neutrophils 38 Not Estab. %   Lymphs 47 Not Estab. %   Monocytes 9 Not Estab. %   Eos 5 Not Estab. %   Basos 1 Not Estab. %   Neutrophils Absolute 2.0 1.4 - 7.0 x10E3/uL   Lymphocytes Absolute 2.4 0.7 - 3.1 x10E3/uL   Monocytes Absolute 0.5 0.1 - 0.9 x10E3/uL   EOS (ABSOLUTE) 0.3 0.0 - 0.4 x10E3/uL   Basophils Absolute 0.1 0.0 - 0.2 x10E3/uL   Immature Granulocytes 0 Not Estab. %   Immature Grans (Abs) 0.0 0.0 - 0.1 x10E3/uL      Assessment & Plan:   Problem List Items Addressed This Visit       Cardiovascular and Mediastinum   Migraine  Relevant Medications   gabapentin (NEURONTIN) 300  MG capsule     Respiratory   Allergic rhinitis due to pollen     Other   Mixed hyperlipidemia - Primary   Prediabetes   Relevant Medications   tirzepatide (MOUNJARO) 7.5 MG/0.5ML Pen    No follow-ups on file.   Total time spent: 35 minutes  Orson Eva, NP  04/22/2023

## 2023-04-22 NOTE — Patient Instructions (Signed)
1) Vit B complex daily 2) Refilled vit D3, ferrous sulfate and mounjaro 3) Follow up appt in 3 months, 2 weeks, fasting labs prior

## 2023-05-17 ENCOUNTER — Other Ambulatory Visit: Payer: Self-pay | Admitting: Nurse Practitioner

## 2023-05-17 DIAGNOSIS — R7303 Prediabetes: Secondary | ICD-10-CM

## 2023-06-28 ENCOUNTER — Other Ambulatory Visit: Payer: Self-pay | Admitting: Student

## 2023-06-28 DIAGNOSIS — M7522 Bicipital tendinitis, left shoulder: Secondary | ICD-10-CM

## 2023-06-28 DIAGNOSIS — M19012 Primary osteoarthritis, left shoulder: Secondary | ICD-10-CM

## 2023-06-28 DIAGNOSIS — M7580 Other shoulder lesions, unspecified shoulder: Secondary | ICD-10-CM

## 2023-07-08 ENCOUNTER — Ambulatory Visit
Admission: RE | Admit: 2023-07-08 | Discharge: 2023-07-08 | Disposition: A | Discharge status: Home / Self Care | Payer: BC Managed Care – PPO | Source: Ambulatory Visit | Attending: Student | Admitting: Student

## 2023-07-08 DIAGNOSIS — M19012 Primary osteoarthritis, left shoulder: Secondary | ICD-10-CM

## 2023-07-08 DIAGNOSIS — M7580 Other shoulder lesions, unspecified shoulder: Secondary | ICD-10-CM

## 2023-07-08 DIAGNOSIS — M7522 Bicipital tendinitis, left shoulder: Secondary | ICD-10-CM

## 2023-07-13 ENCOUNTER — Other Ambulatory Visit: Payer: BC Managed Care – PPO

## 2023-07-18 ENCOUNTER — Ambulatory Visit: Payer: BC Managed Care – PPO | Admitting: Student in an Organized Health Care Education/Training Program

## 2023-07-22 ENCOUNTER — Ambulatory Visit: Payer: BC Managed Care – PPO | Admitting: Nurse Practitioner

## 2023-08-05 ENCOUNTER — Other Ambulatory Visit: Payer: BC Managed Care – PPO

## 2023-08-05 DIAGNOSIS — E782 Mixed hyperlipidemia: Secondary | ICD-10-CM

## 2023-08-05 DIAGNOSIS — R7303 Prediabetes: Secondary | ICD-10-CM

## 2023-08-05 DIAGNOSIS — I10 Essential (primary) hypertension: Secondary | ICD-10-CM

## 2023-08-05 DIAGNOSIS — Z6841 Body Mass Index (BMI) 40.0 and over, adult: Secondary | ICD-10-CM

## 2023-08-08 ENCOUNTER — Encounter: Payer: Self-pay | Admitting: Cardiology

## 2023-08-08 ENCOUNTER — Ambulatory Visit (INDEPENDENT_AMBULATORY_CARE_PROVIDER_SITE_OTHER): Payer: BC Managed Care – PPO | Admitting: Cardiology

## 2023-08-08 VITALS — BP 132/78 | HR 101 | Ht 67.0 in | Wt 281.6 lb

## 2023-08-08 DIAGNOSIS — R7303 Prediabetes: Secondary | ICD-10-CM

## 2023-08-08 DIAGNOSIS — E782 Mixed hyperlipidemia: Secondary | ICD-10-CM | POA: Diagnosis not present

## 2023-08-08 MED ORDER — LEVOCETIRIZINE DIHYDROCHLORIDE 5 MG PO TABS: 5.0000 mg | ORAL_TABLET | Freq: Every evening | ORAL | 1 refills | Status: DC

## 2023-08-08 MED ORDER — ROSUVASTATIN CALCIUM 20 MG PO TABS: 20.0000 mg | ORAL_TABLET | Freq: Every day | ORAL | 1 refills | Status: DC

## 2023-08-08 NOTE — Progress Notes (Signed)
Established Patient Office Visit  Subjective:  Patient ID: Monique Patton, female    DOB: [birth year]  Age: 58 y.o. MRN: [id number]  Chief Complaint  Patient presents with   Follow-up    3 month follow up    Patient in office for 3 month follow up. Patient doing well, no complaints. Up to date on colonoscopy, mammogram and pap smear.  Recent lab work. Hgb A1c improved. Continue diet and exercise.  Lipid panel well controlled.  Creat increased, sodium elevated. Decrease salt intake and increase water intake.    No other concerns at this time.   Past Medical History:  Diagnosis Date   Allergy    Anemia    Arthritis    Eczema    H/O gastric bypass    Heart murmur    since birth   Persistent headaches    Pre-diabetes    Vitamin D deficiency     Past Surgical History:  Procedure Laterality Date   ABDOMINAL HYSTERECTOMY     ADENIDECTOMY     ANTERIOR CERVICAL DECOMP/DISCECTOMY FUSION  03/13/2012   Procedure: ANTERIOR CERVICAL DECOMPRESSION/DISCECTOMY FUSION 2 LEVELS;  Surgeon: Hewitt Shorts, MD;  Location: [facility];  Service: Neurosurgery;  Laterality: N/A;  Cervical Five-Six Cervical Six-Seven Anterior Cervical Decompression with fusion plating and bonegraft   COLONOSCOPY WITH PROPOFOL N/A 01/07/2018   Procedure: COLONOSCOPY WITH PROPOFOL;  Surgeon: Toledo, Boykin Nearing, MD;  Location: [facility];  Service: Gastroenterology;  Laterality: N/A;   GASTRIC BYPASS  2003   LUMBAR LAMINECTOMY/DECOMPRESSION MICRODISCECTOMY Left 05/14/2013   Procedure: LUMBAR LAMINECTOMY/DECOMPRESSION MICRODISCECTOMY 1 LEVEL;  Surgeon: Hewitt Shorts, MD;  Location: [facility];  Service: Neurosurgery;  Laterality: Left;  LEFT Lumbar four-five4 extraforaminal microdiskectomy   POSTERIOR CERVICAL FUSION/FORAMINOTOMY N/A 11/05/2013   Procedure: Cervical five-six posterior arthrodesis and instrumentation;  Surgeon: Hewitt Shorts, MD;  Location: [facility];  Service: Neurosurgery;   Laterality: N/A;  Cervical five-six posterior arthrodesis and instrumentation   TONSILLECTOMY     TOTAL KNEE ARTHROPLASTY Left 11/09/2021   Procedure: TOTAL KNEE ARTHROPLASTY;  Surgeon: Kennedy Bucker, MD;  Location: [facility];  Service: Orthopedics;  Laterality: Left;   TOTAL KNEE ARTHROPLASTY Right 05/29/2022   Procedure: TOTAL KNEE ARTHROPLASTY;  Surgeon: Kennedy Bucker, MD;  Location: [facility];  Service: Orthopedics;  Laterality: Right;    Social History   Socioeconomic History   Marital status: Married    Spouse name: Onalee Hua   Number of children: 1   Years of education: Not on file   Highest education level: Not on file  Occupational History   Not on file  Tobacco Use   Smoking status: Former    Current packs/day: 0.00    Average packs/day: 0.1 packs/day for 35.0 years (3.5 ttl pk-yrs)    Types: Cigarettes    Start date: 01/07/1975    Quit date: 01/07/2010    Years since quitting: 13.5   Smokeless tobacco: Never  Vaping Use   Vaping status: Never Used  Substance and Sexual Activity   Alcohol use: No   Drug use: No   Sexual activity: Not on file  Other Topics Concern   Not on file  Social History Narrative   Not on file   Social Determinants of Health   Financial Resource Strain: Not on file  Food Insecurity: Not on file  Transportation Needs: Not on file  Physical Activity: Not on file  Stress: Not on file  Social Connections: Not on file  Intimate Partner Violence: Not on file    Family History  Problem Relation Age of Onset   Hyperlipidemia Father    Hypertension Father    Diabetes Father    Breast cancer Neg Hx     Allergies  Allergen Reactions   Other Itching and Rash    Cat Dander   Fish Allergy Rash   Penicillins Itching and Rash    TOLERATED CEFAZOLIN   Shellfish Allergy Rash    Review of Systems  Constitutional: Negative.   HENT: Negative.    Eyes: Negative.   Respiratory: Negative.  Negative for shortness of breath.   Cardiovascular:  Negative.  Negative for chest pain.  Gastrointestinal: Negative.  Negative for abdominal pain, constipation and diarrhea.  Genitourinary: Negative.   Musculoskeletal:  Negative for joint pain and myalgias.  Skin: Negative.   Neurological: Negative.  Negative for dizziness and headaches.  Endo/Heme/Allergies: Negative.   All other systems reviewed and are negative.      Objective:   BP 132/78   Pulse (!) 101   Ht 5\' 7"  (1.702 m)   Wt 281 lb 9.6 oz (127.7 kg)   SpO2 99%   BMI 44.10 kg/m   Vitals:   08/08/23 1312  BP: 132/78  Pulse: (!) 101  Height: 5\' 7"  (1.702 m)  Weight: 281 lb 9.6 oz (127.7 kg)  SpO2: 99%  BMI (Calculated): 44.09    Physical Exam Vitals and nursing note reviewed.  Constitutional:      Appearance: Normal appearance. She is normal weight.  HENT:     Head: Normocephalic and atraumatic.     Nose: Nose normal.     Mouth/Throat:     Mouth: Mucous membranes are moist.  Eyes:     Extraocular Movements: Extraocular movements intact.     Conjunctiva/sclera: Conjunctivae normal.     Pupils: Pupils are equal, round, and reactive to light.  Cardiovascular:     Rate and Rhythm: Normal rate and regular rhythm.     Pulses: Normal pulses.     Heart sounds: Normal heart sounds.  Pulmonary:     Effort: Pulmonary effort is normal.     Breath sounds: Normal breath sounds.  Abdominal:     General: Abdomen is flat. Bowel sounds are normal.     Palpations: Abdomen is soft.  Musculoskeletal:        General: Normal range of motion.     Cervical back: Normal range of motion.  Skin:    General: Skin is warm and dry.  Neurological:     General: No focal deficit present.     Mental Status: She is alert and oriented to person, place, and time.  Psychiatric:        Mood and Affect: Mood normal.        Behavior: Behavior normal.        Thought Content: Thought content normal.        Judgment: Judgment normal.      No results found for any visits on  08/08/23.  Recent Results (from the past 2160 hour(s))  Hemoglobin A1c     Status: Abnormal   Collection Time: 08/05/23  9:40 AM  Result Value Ref Range   Hgb A1c MFr Bld 5.7 (H) 4.8 - 5.6 %    Comment:          Prediabetes: 5.7 - 6.4          Diabetes: >6.4          Glycemic  control for adults with diabetes: <7.0    Est. average glucose Bld gHb Est-mCnc 117 mg/dL  TSH     Status: None   Collection Time: 08/05/23  9:40 AM  Result Value Ref Range   TSH 1.830 0.450 - 4.500 uIU/mL  CMP14+EGFR     Status: Abnormal   Collection Time: 08/05/23  9:40 AM  Result Value Ref Range   Glucose 89 70 - 99 mg/dL   BUN 17 6 - 24 mg/dL   Creatinine, Ser 1.61 (H) 0.57 - 1.00 mg/dL   eGFR 61 >09 UE/AVW/0.98   BUN/Creatinine Ratio 16 9 - 23   Sodium 146 (H) 134 - 144 mmol/L   Potassium 4.4 3.5 - 5.2 mmol/L   Chloride 105 96 - 106 mmol/L   CO2 23 20 - 29 mmol/L   Calcium 9.7 8.7 - 10.2 mg/dL   Total Protein 6.1 6.0 - 8.5 g/dL   Albumin 4.0 3.8 - 4.9 g/dL   Globulin, Total 2.1 1.5 - 4.5 g/dL   Bilirubin Total 0.3 0.0 - 1.2 mg/dL   Alkaline Phosphatase 89 44 - 121 IU/L   AST 25 0 - 40 IU/L   ALT 14 0 - 32 IU/L  Lipid panel     Status: None   Collection Time: 08/05/23  9:40 AM  Result Value Ref Range   Cholesterol, Total 173 100 - 199 mg/dL   Triglycerides 57 0 - 149 mg/dL   HDL 70 >11 mg/dL   VLDL Cholesterol Cal 11 5 - 40 mg/dL   LDL Chol Calc (NIH) 92 0 - 99 mg/dL   Chol/HDL Ratio 2.5 0.0 - 4.4 ratio    Comment:                                   T. Chol/HDL Ratio                                             Men  Women                               1/2 Avg.Risk  3.4    3.3                                   Avg.Risk  5.0    4.4                                2X Avg.Risk  9.6    7.1                                3X Avg.Risk 23.4   11.0   CBC with Diff     Status: None   Collection Time: 08/05/23  9:40 AM  Result Value Ref Range   WBC 6.4 3.4 - 10.8 x10E3/uL   RBC 4.49 3.77 - 5.28  x10E6/uL   Hemoglobin 12.4 11.1 - 15.9 g/dL   Hematocrit 91.4 78.2 - 46.6 %   MCV 86 79 - 97 fL   MCH 27.6 26.6 - 33.0 pg  MCHC 32.0 31.5 - 35.7 g/dL   RDW 16.1 09.6 - 04.5 %   Platelets 240 150 - 450 x10E3/uL   Neutrophils 47 Not Estab. %   Lymphs 40 Not Estab. %   Monocytes 8 Not Estab. %   Eos 4 Not Estab. %   Basos 1 Not Estab. %   Neutrophils Absolute 3.1 1.4 - 7.0 x10E3/uL   Lymphocytes Absolute 2.6 0.7 - 3.1 x10E3/uL   Monocytes Absolute 0.5 0.1 - 0.9 x10E3/uL   EOS (ABSOLUTE) 0.3 0.0 - 0.4 x10E3/uL   Basophils Absolute 0.0 0.0 - 0.2 x10E3/uL   Immature Granulocytes 0 Not Estab. %   Immature Grans (Abs) 0.0 0.0 - 0.1 x10E3/uL      Assessment & Plan:  Increase water intake. Decrease sodium intake.  Continue diet and exercise. Continue all medications.  Problem List Items Addressed This Visit       Other   Mixed hyperlipidemia - Primary   Relevant Medications   rosuvastatin (CRESTOR) 20 MG tablet   Prediabetes    Return in about 4 months (around 12/08/2023) for with fasting labs prior.   Total time spent: 25 minutes  [employer], NP  08/08/2023   This document may have been prepared by Dragon Voice Recognition software and as such may include unintentional dictation errors.

## 2023-08-19 ENCOUNTER — Other Ambulatory Visit: Payer: Self-pay | Admitting: Surgery

## 2023-08-20 ENCOUNTER — Other Ambulatory Visit: Payer: Self-pay

## 2023-08-20 ENCOUNTER — Encounter
Admission: RE | Admit: 2023-08-20 | Discharge: 2023-08-20 | Disposition: A | Discharge status: Home / Self Care | Payer: BC Managed Care – PPO | Source: Ambulatory Visit | Attending: Surgery | Admitting: Surgery

## 2023-08-20 HISTORY — DX: Hyperlipidemia, unspecified: E78.5

## 2023-08-20 HISTORY — DX: Other cervical disc degeneration, unspecified cervical region: M50.30

## 2023-08-20 HISTORY — DX: Complete rotator cuff tear or rupture of unspecified shoulder, not specified as traumatic: M75.120

## 2023-08-20 HISTORY — DX: Impingement syndrome of left shoulder: M75.42

## 2023-08-20 HISTORY — DX: Sleep apnea, unspecified: G47.30

## 2023-08-20 NOTE — Patient Instructions (Addendum)
Your procedure is scheduled on: 08/22/23 - Thursday Report to the Registration Desk on the 1st floor of the [facility]. To find out your arrival time, please call [phone] between 1PM - 3PM on: 08/21/23 - Wednesday If your arrival time is 6:00 am, do not arrive before that time as the [facility] entrance doors do not open until 6:00 am.  REMEMBER: Instructions that are not followed completely may result in serious medical risk, up to and including death; or upon the discretion of your surgeon and anesthesiologist your surgery may need to be rescheduled.  Do not eat food after midnight the night before surgery.  No gum chewing or hard candies.  You may however, drink CLEAR liquids up to 2 hours before you are scheduled to arrive for your surgery. Do not drink anything within 2 hours of your scheduled arrival time.  Clear liquids include: - water  - apple juice without pulp - gatorade (not RED colors) - black coffee or tea (Do NOT add milk or creamers to the coffee or tea) Do NOT drink anything that is not on this list.  In addition, your doctor has ordered for you to drink the provided:  Ensure Pre-Surgery Clear Carbohydrate Drink  Drinking this carbohydrate drink up to two hours before surgery helps to reduce insulin resistance and improve patient outcomes. Please complete drinking 2 hours before scheduled arrival time.  One week prior to surgery: Stop Anti-inflammatories (NSAIDS) such as Advil, Aleve, Ibuprofen, Motrin, Naproxen, Naprosyn and Aspirin based products such as Excedrin, Goody's Powder, BC Powder.  Stop ANY OVER THE COUNTER supplements until after surgery.  You may take Tylenol if needed for pain up until the day of surgery.   TAKE ONLY THESE MEDICATIONS THE MORNING OF SURGERY WITH A SIP OF WATER:  gabapentin (NEURONTIN)   No Alcohol for 24 hours before or after surgery.  No Smoking including e-cigarettes for 24 hours before surgery.  No chewable tobacco  products for at least 6 hours before surgery.  No nicotine patches on the day of surgery.  Do not use any "recreational" drugs for at least a week (preferably 2 weeks) before your surgery.  Please be advised that the combination of cocaine and anesthesia may have negative outcomes, up to and including death. If you test positive for cocaine, your surgery will be cancelled.  On the morning of surgery brush your teeth with toothpaste and water, you may rinse your mouth with mouthwash if you wish. Do not swallow any toothpaste or mouthwash.  Use CHG Soap or wipes as directed on instruction sheet.  Do not wear jewelry, make-up, hairpins, clips or nail polish.  Do not wear lotions, powders, or perfumes.   Do not shave body hair from the neck down 48 hours before surgery.  Contact lenses, hearing aids and dentures may not be worn into surgery.  Do not bring valuables to the hospital. [facility] is not responsible for any missing/lost belongings or valuables.   Notify your doctor if there is any change in your medical condition (cold, fever, infection).  Wear comfortable clothing (specific to your surgery type) to the hospital.  After surgery, you can help prevent lung complications by doing breathing exercises.  Take deep breaths and cough every 1-2 hours. Your doctor may order a device called an Incentive Spirometer to help you take deep breaths. When coughing or sneezing, hold a pillow firmly against your incision with both hands. This is called "splinting." Doing this helps protect your  incision. It also decreases belly discomfort.  If you are being admitted to the hospital overnight, leave your suitcase in the car. After surgery it may be brought to your room.  In case of increased patient census, it may be necessary for you, the patient, to continue your postoperative care in the Same Day Surgery department.  If you are being discharged the day of surgery, you will not be allowed to  drive home. You will need a responsible individual to drive you home and stay with you for 24 hours after surgery.   If you are taking public transportation, you will need to have a responsible individual with you.  Please call the Pre-admissions Testing Dept. at [phone] if you have any questions about these instructions.  Surgery Visitation Policy:  Patients having surgery or a procedure may have two visitors.  Children under the age of 62 must have an adult with them who is not the patient.  Inpatient Visitation:    Visiting hours are 7 a.m. to 8 p.m. Up to four visitors are allowed at one time in a patient room. The visitors may rotate out with other people during the day.  One visitor age 48 or older may stay with the patient overnight and must be in the room by 8 p.m.    Preparing for Surgery with CHLORHEXIDINE GLUCONATE (CHG) Soap  Chlorhexidine Gluconate (CHG) Soap  o An antiseptic cleaner that kills germs and bonds with the skin to continue killing germs even after washing  o Used for showering the night before surgery and morning of surgery  Before surgery, you can play an important role by reducing the number of germs on your skin.  CHG (Chlorhexidine gluconate) soap is an antiseptic cleanser which kills germs and bonds with the skin to continue killing germs even after washing.  Please do not use if you have an allergy to CHG or antibacterial soaps. If your skin becomes reddened/irritated stop using the CHG.  1. Shower the NIGHT BEFORE SURGERY and the MORNING OF SURGERY with CHG soap.  2. If you choose to wash your hair, wash your hair first as usual with your normal shampoo.  3. After shampooing, rinse your hair and body thoroughly to remove the shampoo.  4. Use CHG as you would any other liquid soap. You can apply CHG directly to the skin and wash gently with a scrungie or a clean washcloth.  5. Apply the CHG soap to your body only from the neck down. Do  not use on open wounds or open sores. Avoid contact with your eyes, ears, mouth, and genitals (private parts). Wash face and genitals (private parts) with your normal soap.  6. Wash thoroughly, paying special attention to the area where your surgery will be performed.  7. Thoroughly rinse your body with warm water.  8. Do not shower/wash with your normal soap after using and rinsing off the CHG soap.  9. Pat yourself dry with a clean towel.  10. Wear clean pajamas to bed the night before surgery.  12. Place clean sheets on your bed the night of your first shower and do not sleep with pets.  13. Shower again with the CHG soap on the day of surgery prior to arriving at the hospital.  14. Do not apply any deodorants/lotions/powders.  15. Please wear clean clothes to the hospital.

## 2023-08-22 ENCOUNTER — Other Ambulatory Visit: Payer: Self-pay

## 2023-08-22 ENCOUNTER — Ambulatory Visit: Payer: BC Managed Care – PPO | Admitting: Certified Registered"

## 2023-08-22 ENCOUNTER — Encounter: Payer: Self-pay | Admitting: Surgery

## 2023-08-22 ENCOUNTER — Ambulatory Visit
Admission: RE | Admit: 2023-08-22 | Discharge: 2023-08-22 | Disposition: A | Discharge status: Home / Self Care | Payer: BC Managed Care – PPO | Source: Ambulatory Visit | Attending: Surgery | Admitting: Surgery

## 2023-08-22 ENCOUNTER — Ambulatory Visit: Payer: BC Managed Care – PPO

## 2023-08-22 ENCOUNTER — Encounter
Admission: RE | Disposition: A | Discharge status: Home / Self Care | Payer: Self-pay | Source: Ambulatory Visit | Attending: Surgery

## 2023-08-22 DIAGNOSIS — G4730 Sleep apnea, unspecified: Secondary | ICD-10-CM | POA: Diagnosis not present

## 2023-08-22 DIAGNOSIS — Z87891 Personal history of nicotine dependence: Secondary | ICD-10-CM | POA: Diagnosis not present

## 2023-08-22 DIAGNOSIS — M75122 Complete rotator cuff tear or rupture of left shoulder, not specified as traumatic: Secondary | ICD-10-CM | POA: Insufficient documentation

## 2023-08-22 DIAGNOSIS — Z9884 Bariatric surgery status: Secondary | ICD-10-CM | POA: Diagnosis not present

## 2023-08-22 DIAGNOSIS — E119 Type 2 diabetes mellitus without complications: Secondary | ICD-10-CM | POA: Diagnosis not present

## 2023-08-22 DIAGNOSIS — Z981 Arthrodesis status: Secondary | ICD-10-CM | POA: Insufficient documentation

## 2023-08-22 DIAGNOSIS — M25812 Other specified joint disorders, left shoulder: Secondary | ICD-10-CM | POA: Diagnosis not present

## 2023-08-22 DIAGNOSIS — M19012 Primary osteoarthritis, left shoulder: Secondary | ICD-10-CM | POA: Diagnosis not present

## 2023-08-22 HISTORY — PX: SHOULDER ARTHROSCOPY WITH SUBACROMIAL DECOMPRESSION, ROTATOR CUFF REPAIR AND BICEP TENDON REPAIR: SHX5687

## 2023-08-22 LAB — GLUCOSE, CAPILLARY: Glucose-Capillary: 94 mg/dL (ref 70–99)

## 2023-08-22 SURGERY — SHOULDER ARTHROSCOPY WITH SUBACROMIAL DECOMPRESSION, ROTATOR CUFF REPAIR AND BICEP TENDON REPAIR
Anesthesia: General | Laterality: Left

## 2023-08-22 MED ORDER — BUPIVACAINE LIPOSOME 1.3 % IJ SUSP
INTRAMUSCULAR | Status: DC | PRN
  Administered 2023-08-22: 20 mL

## 2023-08-22 MED ORDER — BUPIVACAINE-EPINEPHRINE 0.5% -1:200000 IJ SOLN
INTRAMUSCULAR | Status: DC | PRN
  Administered 2023-08-22: 30 mL

## 2023-08-22 MED ORDER — MIDAZOLAM HCL 2 MG/2ML IJ SOLN
1.0000 mg | Freq: Once | INTRAMUSCULAR | Status: AC
  Administered 2023-08-22: 1 mg via INTRAVENOUS

## 2023-08-22 MED ORDER — GLYCOPYRROLATE 0.2 MG/ML IJ SOLN
INTRAMUSCULAR | Status: DC | PRN
  Administered 2023-08-22: .2 mg via INTRAVENOUS

## 2023-08-22 MED ORDER — CHLORHEXIDINE GLUCONATE 0.12 % MT SOLN
OROMUCOSAL | Status: AC
  Filled 2023-08-22: qty 15

## 2023-08-22 MED ORDER — FENTANYL CITRATE PF 50 MCG/ML IJ SOSY
50.0000 ug | PREFILLED_SYRINGE | Freq: Once | INTRAMUSCULAR | Status: AC
  Administered 2023-08-22: 50 ug via INTRAVENOUS

## 2023-08-22 MED ORDER — FENTANYL CITRATE (PF) 100 MCG/2ML IJ SOLN: 25.0000 ug | INTRAMUSCULAR | Status: DC | PRN

## 2023-08-22 MED ORDER — CHLORHEXIDINE GLUCONATE 0.12 % MT SOLN
15.0000 mL | Freq: Once | OROMUCOSAL | Status: AC
  Administered 2023-08-22: 15 mL via OROMUCOSAL

## 2023-08-22 MED ORDER — ROCURONIUM BROMIDE 100 MG/10ML IV SOLN
INTRAVENOUS | Status: DC | PRN
  Administered 2023-08-22: 70 mg via INTRAVENOUS

## 2023-08-22 MED ORDER — BUPIVACAINE HCL (PF) 0.5 % IJ SOLN
INTRAMUSCULAR | Status: AC
  Filled 2023-08-22: qty 10

## 2023-08-22 MED ORDER — OXYCODONE HCL 5 MG PO TABS: 5.0000 mg | ORAL_TABLET | Freq: Once | ORAL | Status: DC | PRN

## 2023-08-22 MED ORDER — ACETAMINOPHEN 10 MG/ML IV SOLN
INTRAVENOUS | Status: AC
  Filled 2023-08-22: qty 100

## 2023-08-22 MED ORDER — KETOROLAC TROMETHAMINE 15 MG/ML IJ SOLN
15.0000 mg | Freq: Once | INTRAMUSCULAR | Status: AC
  Administered 2023-08-22: 15 mg via INTRAVENOUS

## 2023-08-22 MED ORDER — CEFAZOLIN SODIUM-DEXTROSE 2-4 GM/100ML-% IV SOLN
INTRAVENOUS | Status: AC
  Filled 2023-08-22: qty 100

## 2023-08-22 MED ORDER — LACTATED RINGERS IR SOLN
Status: DC | PRN
  Administered 2023-08-22: 3000 mL

## 2023-08-22 MED ORDER — DEXAMETHASONE SODIUM PHOSPHATE 10 MG/ML IJ SOLN
INTRAMUSCULAR | Status: DC | PRN
  Administered 2023-08-22: 10 mg via INTRAVENOUS

## 2023-08-22 MED ORDER — EPINEPHRINE PF 1 MG/ML IJ SOLN
INTRAMUSCULAR | Status: AC
  Filled 2023-08-22: qty 1

## 2023-08-22 MED ORDER — BUPIVACAINE HCL (PF) 0.5 % IJ SOLN
INTRAMUSCULAR | Status: DC | PRN
  Administered 2023-08-22: 10 mL

## 2023-08-22 MED ORDER — ROCURONIUM BROMIDE 10 MG/ML (PF) SYRINGE
PREFILLED_SYRINGE | INTRAVENOUS | Status: AC
  Filled 2023-08-22: qty 10

## 2023-08-22 MED ORDER — PROPOFOL 10 MG/ML IV BOLUS
INTRAVENOUS | Status: AC
  Filled 2023-08-22: qty 20

## 2023-08-22 MED ORDER — BUPIVACAINE HCL (PF) 0.5 % IJ SOLN
INTRAMUSCULAR | Status: AC
  Filled 2023-08-22: qty 30

## 2023-08-22 MED ORDER — LIDOCAINE HCL (CARDIAC) PF 100 MG/5ML IV SOSY
PREFILLED_SYRINGE | INTRAVENOUS | Status: DC | PRN
  Administered 2023-08-22: 100 mg via INTRAVENOUS

## 2023-08-22 MED ORDER — PROPOFOL 10 MG/ML IV BOLUS
INTRAVENOUS | Status: DC | PRN
  Administered 2023-08-22: 120 mg via INTRAVENOUS

## 2023-08-22 MED ORDER — FENTANYL CITRATE PF 50 MCG/ML IJ SOSY
PREFILLED_SYRINGE | INTRAMUSCULAR | Status: AC
  Filled 2023-08-22: qty 1

## 2023-08-22 MED ORDER — LIDOCAINE HCL (PF) 2 % IJ SOLN
INTRAMUSCULAR | Status: AC
  Filled 2023-08-22: qty 5

## 2023-08-22 MED ORDER — PHENYLEPHRINE 80 MCG/ML (10ML) SYRINGE FOR IV PUSH (FOR BLOOD PRESSURE SUPPORT)
PREFILLED_SYRINGE | INTRAVENOUS | Status: AC
  Filled 2023-08-22: qty 10

## 2023-08-22 MED ORDER — FENTANYL CITRATE (PF) 100 MCG/2ML IJ SOLN
INTRAMUSCULAR | Status: AC
  Filled 2023-08-22: qty 2

## 2023-08-22 MED ORDER — FAMOTIDINE 20 MG PO TABS
20.0000 mg | ORAL_TABLET | Freq: Once | ORAL | Status: AC
  Administered 2023-08-22: 20 mg via ORAL

## 2023-08-22 MED ORDER — MIDAZOLAM HCL 2 MG/2ML IJ SOLN
INTRAMUSCULAR | Status: AC
  Filled 2023-08-22: qty 2

## 2023-08-22 MED ORDER — ACETAMINOPHEN 10 MG/ML IV SOLN
INTRAVENOUS | Status: DC | PRN
  Administered 2023-08-22: 1000 mg via INTRAVENOUS

## 2023-08-22 MED ORDER — OXYCODONE HCL 5 MG PO TABS: 5.0000 mg | ORAL_TABLET | ORAL | 0 refills | Status: DC | PRN

## 2023-08-22 MED ORDER — BUPIVACAINE LIPOSOME 1.3 % IJ SUSP
INTRAMUSCULAR | Status: AC
  Filled 2023-08-22: qty 20

## 2023-08-22 MED ORDER — OXYCODONE HCL 5 MG/5ML PO SOLN: 5.0000 mg | Freq: Once | ORAL | Status: DC | PRN

## 2023-08-22 MED ORDER — FENTANYL CITRATE (PF) 100 MCG/2ML IJ SOLN
INTRAMUSCULAR | Status: DC | PRN
  Administered 2023-08-22 (×2): 50 ug via INTRAVENOUS

## 2023-08-22 MED ORDER — CEFAZOLIN IN SODIUM CHLORIDE 3-0.9 GM/100ML-% IV SOLN
3.0000 g | INTRAVENOUS | Status: AC
  Administered 2023-08-22: 3 g via INTRAVENOUS
  Filled 2023-08-22: qty 100

## 2023-08-22 MED ORDER — SUGAMMADEX SODIUM 200 MG/2ML IV SOLN
INTRAVENOUS | Status: DC | PRN
  Administered 2023-08-22: 250 mg via INTRAVENOUS

## 2023-08-22 MED ORDER — LACTATED RINGERS IV SOLN: INTRAVENOUS | Status: DC

## 2023-08-22 MED ORDER — ORAL CARE MOUTH RINSE: 15.0000 mL | Freq: Once | OROMUCOSAL | Status: AC

## 2023-08-22 MED ORDER — FAMOTIDINE 20 MG PO TABS
ORAL_TABLET | ORAL | Status: AC
  Filled 2023-08-22: qty 1

## 2023-08-22 MED ORDER — ONDANSETRON HCL 4 MG/2ML IJ SOLN
INTRAMUSCULAR | Status: DC | PRN
  Administered 2023-08-22: 4 mg via INTRAVENOUS

## 2023-08-22 MED ORDER — KETOROLAC TROMETHAMINE 15 MG/ML IJ SOLN
INTRAMUSCULAR | Status: AC
  Filled 2023-08-22: qty 1

## 2023-08-22 SURGICAL SUPPLY — 52 items
ANCH SUT 5.5 KNTLS (Anchor) ×2 IMPLANT
ANCHOR HEALICOIL REGEN 5.5 (Anchor) IMPLANT
ANCHOR QFIX 2.8 SUT MINI TAPE (Anchor) IMPLANT
APL PRP STRL LF DISP 70% ISPRP (MISCELLANEOUS) ×1
BIT DRILL JUGRKNT W/NDL BIT2.9 (DRILL) IMPLANT
BLADE FULL RADIUS 3.5 (BLADE) ×1 IMPLANT
BUR ACROMIONIZER 4.0 (BURR) ×1 IMPLANT
CANNULA SHAVER 8MMX76MM (CANNULA) ×1 IMPLANT
CHLORAPREP W/TINT 26 (MISCELLANEOUS) ×1 IMPLANT
COVER MAYO STAND STRL (DRAPES) ×1 IMPLANT
DILATOR 5.5 THREADED HEALICOIL (MISCELLANEOUS) IMPLANT
DRILL JUGGERKNOT W/NDL BIT 2.9 (DRILL)
ELECT CAUTERY BLADE 6.4 (BLADE) ×1 IMPLANT
ELECT REM PT RETURN 9FT ADLT (ELECTROSURGICAL) ×1
ELECTRODE REM PT RTRN 9FT ADLT (ELECTROSURGICAL) ×1 IMPLANT
GAUZE SPONGE 4X4 12PLY STRL (GAUZE/BANDAGES/DRESSINGS) ×1 IMPLANT
GAUZE XEROFORM 1X8 LF (GAUZE/BANDAGES/DRESSINGS) ×1 IMPLANT
GLOVE BIO SURGEON STRL SZ7.5 (GLOVE) ×2 IMPLANT
GLOVE BIO SURGEON STRL SZ8 (GLOVE) ×2 IMPLANT
GLOVE BIOGEL PI IND STRL 8 (GLOVE) ×1 IMPLANT
GLOVE INDICATOR 8.0 STRL GRN (GLOVE) ×1 IMPLANT
GOWN STRL REUS W/ TWL LRG LVL3 (GOWN DISPOSABLE) ×1 IMPLANT
GOWN STRL REUS W/ TWL XL LVL3 (GOWN DISPOSABLE) ×1 IMPLANT
GOWN STRL REUS W/TWL LRG LVL3 (GOWN DISPOSABLE) ×1
GOWN STRL REUS W/TWL XL LVL3 (GOWN DISPOSABLE) ×1
GRASPER SUT 15 45D LOW PRO (SUTURE) IMPLANT
IV LACTATED RINGER IRRG 3000ML (IV SOLUTION) ×1
IV LR IRRIG 3000ML ARTHROMATIC (IV SOLUTION) ×2 IMPLANT
KIT CANNULA 8X76-LX IN CANNULA (CANNULA) ×1 IMPLANT
KIT SUTURE 2.8 Q-FIX DISP (MISCELLANEOUS) IMPLANT
MANIFOLD NEPTUNE II (INSTRUMENTS) ×2 IMPLANT
MASK FACE SPIDER DISP (MASK) ×1 IMPLANT
MAT ABSORB FLUID 56X50 GRAY (MISCELLANEOUS) ×1 IMPLANT
PACK ARTHROSCOPY SHOULDER (MISCELLANEOUS) ×1 IMPLANT
PAD ABD DERMACEA PRESS 5X9 (GAUZE/BANDAGES/DRESSINGS) ×2 IMPLANT
PASSER SUT FIRSTPASS SELF (INSTRUMENTS) IMPLANT
SLEEVE REMOTE CONTROL 5X12 (DRAPES) IMPLANT
SLING ARM LRG DEEP (SOFTGOODS) ×1 IMPLANT
SLING ULTRA II LG (MISCELLANEOUS) ×1 IMPLANT
SPONGE T-LAP 18X18 ~~LOC~~+RFID (SPONGE) ×1 IMPLANT
STAPLER SKIN PROX 35W (STAPLE) ×1 IMPLANT
STRAP SAFETY 5IN WIDE (MISCELLANEOUS) ×1 IMPLANT
SUT ETHIBOND 0 MO6 C/R (SUTURE) ×1 IMPLANT
SUT ULTRABRAID 2 COBRAID 38 (SUTURE) IMPLANT
SUT VIC AB 2-0 CT1 27 (SUTURE) ×2
SUT VIC AB 2-0 CT1 TAPERPNT 27 (SUTURE) ×2 IMPLANT
TAPE MICROFOAM 4IN (TAPE) ×1 IMPLANT
TRAP FLUID SMOKE EVACUATOR (MISCELLANEOUS) ×1 IMPLANT
TUBE SET DOUBLEFLO INFLOW (TUBING) ×1 IMPLANT
TUBING CONNECTING 10 (TUBING) ×1 IMPLANT
WAND WEREWOLF FLOW 90D (MISCELLANEOUS) ×1 IMPLANT
WATER STERILE IRR 500ML POUR (IV SOLUTION) ×1 IMPLANT

## 2023-08-22 NOTE — Anesthesia Procedure Notes (Signed)
Procedure Name: Intubation Date/Time: 08/22/2023 1:11 PM  Performed by: Morene Crocker, CRNAPre-anesthesia Checklist: Patient identified, Patient being monitored, Emergency Drugs available and Suction available Patient Re-evaluated:Patient Re-evaluated prior to induction Oxygen Delivery Method: Circle system utilized Preoxygenation: Pre-oxygenation with 100% oxygen Induction Type: IV induction Ventilation: Mask ventilation without difficulty and Oral airway inserted - appropriate to patient size Laryngoscope Size: Mac and 3 Grade View: Grade I Tube type: Oral Tube size: 7.0 mm Number of attempts: 1 Airway Equipment and Method: Stylet Placement Confirmation: ETT inserted through vocal cords under direct vision, positive ETCO2 and breath sounds checked- equal and bilateral Secured at: 21 cm Tube secured with: Tape Dental Injury: Teeth and Oropharynx as per pre-operative assessment  Comments: Atraumatic intubation. Lauren Cozart, SRNA placed ETT under direct supervision. MDA and CRNA at bedside.

## 2023-08-22 NOTE — Transfer of Care (Signed)
Immediate Anesthesia Transfer of Care Note  Patient: Monique Patton  Procedure(s) Performed: LEFT SHOULDER ARTHROSCOPY WITH DEBRIDEMENT, DECOMPRESSION, DISTAL CLAVICLE EXCISION, BICEPS TENODESIS, AND MINI OPEN ROTATOR CUFF REPAIR. (Left)  Patient Location: PACU  Anesthesia Type:General  Level of Consciousness: awake and drowsy  Airway & Oxygen Therapy: Patient Spontanous Breathing and Patient connected to face mask oxygen  Post-op Assessment: Report given to RN and Post -op Vital signs reviewed and stable  Post vital signs: Reviewed and stable  Last Vitals:  Vitals Value Taken Time  BP 154/89 08/22/23 1520  Temp 35.8 1520  Pulse 87 08/22/23 1524  Resp 20 08/22/23 1524  SpO2 100 % 08/22/23 1524  Vitals shown include unfiled device data.  Last Pain:  Vitals:   08/22/23 1132  TempSrc: Temporal  PainSc: 10-Worst pain ever         Complications: No notable events documented.

## 2023-08-22 NOTE — Anesthesia Preprocedure Evaluation (Signed)
Anesthesia Evaluation  Patient identified by MRN, date of birth, ID band Patient awake    Reviewed: Allergy & Precautions, NPO status , Patient's Chart, lab work & pertinent test results  History of Anesthesia Complications Negative for: history of anesthetic complications  Airway Mallampati: III  TM Distance: >3 FB Neck ROM: full    Dental no notable dental hx. (+) Chipped   Pulmonary neg pulmonary ROS, neg sleep apnea, neg COPD, Patient abstained from smoking.Not current smoker, former smoker   Pulmonary exam normal breath sounds clear to auscultation       Cardiovascular Exercise Tolerance: Good (-) hypertension(-) CAD and (-) Past MI negative cardio ROS Normal cardiovascular exam(-) dysrhythmias  Rhythm:Regular Rate:Normal - Systolic murmurs    Neuro/Psych negative neurological ROS  negative psych ROS   GI/Hepatic negative GI ROS, Neg liver ROS,neg GERD  ,,  Endo/Other  negative endocrine ROSneg diabetes    Renal/GU      Musculoskeletal   Abdominal   Peds  Hematology negative hematology ROS (+)   Anesthesia Other Findings Past Medical History: No date: Allergy No date: Anemia No date: Arthritis No date: DDD (degenerative disc disease), cervical No date: Eczema No date: Full thickness rotator cuff tear No date: H/O gastric bypass No date: Heart murmur     Comment:  since birth No date: HLD (hyperlipidemia) No date: Impingement syndrome of shoulder region, left No date: Persistent headaches No date: Pre-diabetes No date: Sleep apnea     Comment:  had surgery /resolved No date: Vitamin D deficiency  Past Surgical History: No date: ABDOMINAL HYSTERECTOMY No date: ADENIDECTOMY 03/13/2012: ANTERIOR CERVICAL DECOMP/DISCECTOMY FUSION     Comment:  Procedure: ANTERIOR CERVICAL DECOMPRESSION/DISCECTOMY               FUSION 2 LEVELS;  Surgeon: Hewitt Shorts, MD;                Location: [facility];   Service: Neurosurgery;                Laterality: N/A;  Cervical Five-Six Cervical Six-Seven               Anterior Cervical Decompression with fusion plating and               bonegraft 01/07/2018: COLONOSCOPY WITH PROPOFOL; N/A     Comment:  Procedure: COLONOSCOPY WITH PROPOFOL;  Surgeon: [place],               Boykin Nearing, MD;  Location: [facility];  Service:               Gastroenterology;  Laterality: N/A; 2003: GASTRIC BYPASS 05/14/2013: LUMBAR LAMINECTOMY/DECOMPRESSION MICRODISCECTOMY; Left     Comment:  Procedure: LUMBAR LAMINECTOMY/DECOMPRESSION               MICRODISCECTOMY 1 LEVEL;  Surgeon: Hewitt Shorts, MD;              Location: [facility];  Service: Neurosurgery;                Laterality: Left;  LEFT Lumbar four-five4 extraforaminal               microdiskectomy 11/05/2013: POSTERIOR CERVICAL FUSION/FORAMINOTOMY; N/A     Comment:  Procedure: Cervical five-six posterior arthrodesis and               instrumentation;  Surgeon: Hewitt Shorts, MD;  Location: [facility];  Service: Neurosurgery;                Laterality: N/A;  Cervical five-six posterior arthrodesis              and instrumentation No date: TONSILLECTOMY 11/09/2021: TOTAL KNEE ARTHROPLASTY; Left     Comment:  Procedure: TOTAL KNEE ARTHROPLASTY;  Surgeon: Kennedy Bucker, MD;  Location: [facility];  Service: Orthopedics;               Laterality: Left; 05/29/2022: TOTAL KNEE ARTHROPLASTY; Right     Comment:  Procedure: TOTAL KNEE ARTHROPLASTY;  Surgeon: Kennedy Bucker, MD;  Location: [facility];  Service: Orthopedics;               Laterality: Right;  BMI    Body Mass Index: 45.44 kg/m      Reproductive/Obstetrics negative OB ROS                              Anesthesia Physical Anesthesia Plan  ASA: 3  Anesthesia Plan: General ETT and General   Post-op Pain Management: Regional block*   Induction: Intravenous  PONV  Risk Score and Plan: 3 and Ondansetron, Dexamethasone and Midazolam  Airway Management Planned: Oral ETT  Additional Equipment:   Intra-op Plan:   Post-operative Plan: Extubation in OR  Informed Consent: I have reviewed the patients History and Physical, chart, labs and discussed the procedure including the risks, benefits and alternatives for the proposed anesthesia with the patient or authorized representative who has indicated his/her understanding and acceptance.     Dental Advisory Given  Plan Discussed with: Anesthesiologist, CRNA and Surgeon  Anesthesia Plan Comments: (Patient consented for risks of anesthesia including but not limited to:  - adverse reactions to medications - damage to eyes, teeth, lips or other oral mucosa - nerve damage due to positioning  - sore throat or hoarseness - Damage to heart, brain, nerves, lungs, other parts of body or loss of life  Patient voiced understanding.)         Anesthesia Quick Evaluation

## 2023-08-22 NOTE — H&P (Signed)
History of Present Illness: Monique Patton is a 58 y.o. female who presents today for repeat evaluation of ongoing left shoulder pain and discussion of recent left shoulder MRI results. The patient has been evaluated past and has been diagnosed with left shoulder osteoarthritic changes in addition to left rotator cuff tendinopathy. Several years ago the patient was evaluated by Dr. Joice Lofts who performed a left subacromial steroid injection which did provide moderate relief up until earlier this year. In April of this year she underwent a left glenohumeral steroid injection which only provided relief for 1-2 weeks, she then was reevaluated and underwent a subacromial steroid injection which did not provide significant relief either. Because of this the patient was sent for an MRI scan and presents today to discuss the results. The patient denies any recent trauma or injury affecting left shoulder. She denies any side effects to the previous injection such as fevers chills or purulent drainage. The patient does continue to report moderate discomfort along the anterolateral aspect of the shoulder with radiation into the left elbow. She does report occasional radiation into the cervical spine and does report a occasional numbness in the left arm but her primary issue is limited range of motion tried to reach above her head out to the side. She is left-hand dominant. No surgical history to the left shoulder. She denies any personal history of heart attack or stroke. She denies any history of asthma or COPD. She does not have any history of diabetes, recent A1c was slightly elevated, potentially prediabetic.  Past Medical History: Arthritis  Diabetes mellitus without complication (CMS/HHS-HCC)  Hyperlipidemia  Migraine headache  Obesity   Past Surgical History: neck surgery 11/03/2013  COLONOSCOPY 01/07/2018 (Normal/ No specimens collected/PHx Cp/ Repeat in 5 years/ [facility])  REVISION TOTAL KNEE ARTHROPLASTY Left  11/09/2021  [facility]  ARTHROPLASTY TOTAL KNEE Right 05/29/2022 (Dr. Rosita Kea)  back surgery  GASTRIC BYPASS OPEN  HYSTERECTOMY  sleep apnea ([place])   Past Family History: Stroke Father  Diabetes type II Father  High blood pressure (Hypertension) Father  Myocardial Infarction (Heart attack) Father   Medications: benzonatate (TESSALON) 100 MG capsule take 1 capsule by mouth every 8 hours as needed for cough  butalbitaL-acetaminop-caf-cod 50-325-40-30 mg  butalbital-acetaminophen-caffeine (FIORICET) 50-325-40 mg tablet  celecoxib (CELEBREX) 200 MG capsule TK ONE C PO ONCE A DAY  cetirizine (ZYRTEC) 10 MG tablet TK 1 T PO ONCE D. NEED APPT. FOR FURTHER REFILLS  cholecalciferol (VITAMIN D3) 5,000 unit capsule Take by mouth  cloNIDine HCL (CATAPRES) 0.2 MG tablet TK 1 T PO ONCE A DAY  cyclobenzaprine (FLEXERIL) 10 MG tablet Take 10 mg by mouth 3 (three) times daily.  docusate (DOK) 100 MG capsule Take 1 capsule by mouth once daily  doxycycline (VIBRA-TABS) 100 MG tablet Take 100 mg by mouth once daily  FEROSUL 325 mg (65 mg iron) tablet Take 325 mg by mouth every evening  fluticasone propionate (FLONASE) 50 mcg/actuation nasal spray  gabapentin (NEURONTIN) 300 MG capsule Take 1 capsule (300 mg total) by mouth 3 (three) times daily 90 capsule 5  levocetirizine (XYZAL) 5 MG tablet Take 1 tablet by mouth once daily  meloxicam (MOBIC) 15 MG tablet Take 1 tablet (15 mg total) by mouth once daily 60 tablet 1  MOUNJARO 7.5 mg/0.5 mL PnIj Inject 7.5 mg subcutaneously once a week  rosuvastatin (CRESTOR) 20 MG tablet Take 1 tablet by mouth once daily  traMADoL (ULTRAM) 50 mg tablet 1 po bid prn 20 tablet 0  ubrogepant (UBRELVY)  100 mg Tab Take 1 tablet by mouth as needed   Allergies: Other Itching and Rash  Cat Dander  Animal Dander Rash  Penicillins Itching and Rash  Shellfish Containing Products Rash   Review of Systems:  A comprehensive 14 point ROS was performed, reviewed by me today, and the  pertinent orthopaedic findings are documented in the HPI.  Physical Exam: BP 118/82  Ht 167.6 cm (5\' 6" )  Wt (!) 126.6 kg (279 lb)  LMP (LMP Unknown)  BMI 45.03 kg/m  General/Constitutional: The patient appears to be well-nourished, well-developed, and in no acute distress. Neuro/Psych: Normal mood and affect, oriented to person, place and time. Eyes: Non-icteric. Pupils are equal, round, and reactive to light, and exhibit synchronous movement. ENT: Unremarkable. Lymphatic: No palpable adenopathy. Respiratory: Lungs clear to auscultation, Normal chest excursion, No wheezes, and Non-labored breathing Cardiovascular: Regular rate and rhythm. No murmurs. and No edema, swelling or tenderness, except as noted in detailed exam. Integumentary: No impressive skin lesions present, except as noted in detailed exam. Musculoskeletal: Unremarkable, except as noted in detailed exam.  General: Well developed, well nourished 58 y.o. female in no apparent distress. Normal affect. Normal communication. Patient answers questions appropriately. The patient has a normal gait. There is no antalgic component.   Left Upper Extremity: Examination of the left shoulder and arm showed no bony abnormality or edema. The patient has decreased active and normal passive motion with abduction, flexion. Patient has internal and slightly limited external rotation the patient has pain with shoulder motion, specifically abduction and flexion greater than 90. The patient has a positive Hawkins test and a positive Impingement test. The patient has a negative drop arm test. The patient has a positive speeds test. The patient is tender along the deltoid muscle. There is subacromial space tenderness with no AC joint tenderness. The patient has no instability of the shoulder with anterior-posterior motion. There is a negative sulcus sign. The rotator cuff muscle strength is 4/5 with supraspinatus, 5/5 with internal rotation, and 5/5  with external rotation. There is no crepitus with range of motion activities.   Neurological: The patient has sensation that is intact to light touch and pinprick bilaterally. The patient has normal grip strength. The patient has full biceps, wrist extension, grip, and interosseous strength. The patient has 2 + DTRs bilaterally.  Vascular: The patient has less than 2 second capillary refill. The patient has normal ulnar and radial pulses. The patient has normal warmth to touch.   Imaging: AP Y and Grashey view of the left shoulder ordered and reviewed at today's visit. Pression: Patient has some degenerative changes along the [facility] joint with adequate subacromial space. There is subchondral cyst formation along the articular surface of the humeral head with decreased joint space in the glenohumeral joint. No evidence of acute bony abnormality  MRI OF THE LEFT SHOULDER WITHOUT CONTRAST:  1. Full-thickness, partial width tear of the supraspinatus tendon  anteriorly with 1 cm retraction.  2. Moderate supraspinatus and infraspinatus tendinosis.  3. Moderate intra-articular biceps tendinosis.  4. Mild acromioclavicular and glenohumeral osteoarthritis.   Impression: 1. Primary osteoarthritis of left shoulder. 2. Nontraumatic complete tear of left rotator cuff 3. Biceps tendinitis of left upper extremity 4. Rotator cuff tendonitis, left 5. Arthritis of left acromioclavicular joint  Plan:  1. Treatment options were discussed today with the patient. 2. The MRI scan of the left shoulder did reveal evidence of a full-thickness rotator cuff tear with biceps tendinopathy, AC joint osteoarthritic changes and  underlying glenohumeral osteoarthritic changes as well. 3. The MRI scan did demonstrate cystic changes along the glenoid however it appears that the actual articular surface is intact without significant cartilage loss. Because of this I believe her primary issue is due to the underlying rotator cuff  tear. 4. After a discussion of the risk and benefits of surgical versus nonsurgical intervention the patient would like to proceed with a left shoulder arthroscopy with debridement, decompression, distal clavicle excision, mini open biceps tenodesis and mini open rotator cuff repair. 5. Surgery will be performed by Dr. Joice Lofts. This document will serve as a surgical history and physical. 6. The patient will follow-up per standard postop protocol. The patient will contact the clinic if she has any questions, new symptoms develop or symptoms worsen.  The procedure was discussed with the patient, as were the potential risks (including bleeding, infection, nerve and/or blood vessel injury, persistent or recurrent pain, failure of the repair, progression of arthritis, need for further surgery, blood clots, strokes, heart attacks and/or arhythmias, pneumonia, etc.) and benefits. The patient states her understanding and wishes to proceed.    H&P reviewed and patient re-examined. No changes.

## 2023-08-22 NOTE — Op Note (Signed)
08/22/2023  3:00 PM  Patient:   Monique Patton  Pre-Op Diagnosis:   Impingement/tendinopathy with rotator cuff tear and degenerative joint disease of AC joint, left shoulder.  Post-Op Diagnosis:   Impingement/tendinopathy with full-thickness rotator cuff tear, degenerative labral fraying, degenerative joint disease of AC joint, and biceps tendinopathy, left shoulder.  Procedure:   Extensive arthroscopic debridement, arthroscopic subacromial decompression, arthroscopic excision of distal clavicle, and mini-open rotator cuff repair, left shoulder.  Anesthesia:   General endotracheal with interscalene block using Exparel placed preoperatively by the anesthesiologist.  Surgeon:   Maryagnes Amos, MD  Assistant:   Horris Latino, PA-C  Findings:   As above. There was a full-thickness tear involving the anterior and middle portions of the supraspinatus tendon insertional fibers. The remainder of the rotator cuff was in satisfactory condition. There was moderate fraying of the superior and posterior superior portions of the labrum without frank detachment from the glenoid rim. The biceps tendon demonstrated moderate fraying of the intra-articular portion. The articular surfaces of the glenoid and humerus both were in satisfactory condition.  Complications:   None  Fluids:   400 cc  Estimated blood loss:   15 cc  Tourniquet time:   None  Drains:   None  Closure:   Staples      Brief clinical note:   The patient is a 58 year old female with a long history of progressively worsening left shoulder pain. The patient's symptoms have progressed despite medications, activity modification, etc. The patient's history and examination are consistent with impingement/tendinopathy with a rotator cuff tear. These findings were confirmed by MRI scan, which also demonstrated moderate degenerative changes of the [facility] joint. The patient presents at this time for definitive management of these shoulder  symptoms.  Procedure:   The patient underwent placement of an interscalene block using Exparel by the anesthesiologist in the preoperative holding area before being brought into the operating room and lain in the supine position. The patient then underwent general endotracheal intubation and anesthesia before being repositioned in the beach chair position using the beach chair positioner. The left shoulder and upper extremity were prepped with ChloraPrep solution before being draped sterilely. Preoperative antibiotics were administered. A timeout was performed to confirm the proper surgical site before the expected portal sites and incision site were injected with 0.5% Sensorcaine with epinephrine.   A posterior portal was created and the glenohumeral joint thoroughly inspected with the findings as described above. An anterior portal was created using an outside-in technique. The labrum and rotator cuff were further probed, again confirming the above-noted findings. The areas of degenerative labral fraying were debrided back to stable margins using the full-radius resector, as were areas of synovitis. The torn portion of the rotator cuff also was debrided back to stable margins using the full-radius resector. The ArthroCare wand was inserted and used to release the biceps tendon from its labral anchor.  It also was used to obtain hemostasis as well as to "anneal" the labrum superiorly and anteriorly. The instruments were removed from the joint after suctioning the excess fluid.  The camera was repositioned through the posterior portal into the subacromial space. A separate lateral portal was created using an outside-in technique. The 3.5 mm full-radius resector was introduced and used to perform a subtotal bursectomy. The ArthroCare wand was then inserted and used to remove the periosteal tissue off the undersurface of the anterior third of the acromion as well as to recess the coracoacromial ligament from its  attachment  along the anterior and lateral margins of the acromion. The 4.0 mm acromionizing bur was introduced and used to complete the decompression by removing the undersurface of the anterior third of the acromion. The full radius resector was reintroduced to remove any residual bony debris before the ArthroCare wand was reintroduced to obtain hemostasis.   The soft tissues and capsule under the [facility] joint were debrided back to stable margins using the ArthroCare wand. The 4 mm acromionizing bur was inserted, then advanced medially and used to remove the undersurface of the distal clavicle. The camera was then repositioned in the lateral portal and the full 4 mm acromionizing bur inserted and used to remove the remainder of the lateralmost 8 to 10 mm of the distal clavicle. The adequacy of distal clavicle excision was verified arthroscopically both from the lateral portal and from the anterior portal and found to be excellent. The instruments were then removed from the subacromial space after suctioning the excess fluid.  An approximately 4-5 cm incision was made over the anterolateral aspect of the shoulder beginning at the anterolateral corner of the acromion and extending distally in line with the bicipital groove. This incision was carried down through the subcutaneous tissues to expose the deltoid fascia. The raphae between the anterior and middle thirds was identified and this plane developed to provide access into the subacromial space. Additional bursal tissues were debrided sharply using Metzenbaum scissors. The rotator cuff tear was readily identified. The margins were debrided sharply with a #15 blade and the exposed greater tuberosity roughened with a rongeur. The tear was repaired using two [employer] 2.9 mm Q-Fix anchors. These sutures were then brought back laterally and secured using two [employer] Nephew Healicoil knotless RegeneSorb anchors to create a two-layer closure. An apparent  watertight closure was obtained.  The wound was copiously irrigated with sterile saline solution before the deltoid raphae was reapproximated using 2-0 Vicryl interrupted sutures. The subcutaneous tissues were closed in two layers using 2-0 Vicryl interrupted sutures before the skin was closed using staples. The portal sites also were closed using staples. A sterile bulky dressing was applied to the shoulder before the arm was placed into a shoulder immobilizer. The patient was then awakened, extubated, and returned to the recovery room in satisfactory condition after tolerating the procedure well.

## 2023-08-22 NOTE — Anesthesia Procedure Notes (Signed)
Anesthesia Regional Block: Interscalene brachial plexus block   Pre-Anesthetic Checklist: , timeout performed,  Correct Patient, Correct Site, Correct Laterality,  Correct Procedure, Correct Position, site marked,  Risks and benefits discussed,  Surgical consent,  Pre-op evaluation,  At surgeon's request and post-op pain management  Laterality: Left  Prep: chloraprep       Needles:  Injection technique: Single-shot  Needle Type: Echogenic Needle     Needle Length: 4cm  Needle Gauge: 25     Additional Needles:   Procedures:,,,, ultrasound used (permanent image in chart),,    Narrative:  Start time: 08/22/2023 12:24 PM End time: 08/22/2023 12:25 PM Injection made incrementally with aspirations every 5 mL.  Performed by: Personally  Anesthesiologist: Stephanie Coup, MD  Additional Notes: Patient's chart reviewed and they were deemed appropriate candidate for procedure, at surgeon's request. Patient educated about risks, benefits, and alternatives of the block including but not limited to: temporary or permanent nerve damage, bleeding, infection, damage to surround tissues, pneumothorax, hemidiaphragmatic paralysis, unilateral Horner's syndrome, block failure, local anesthetic toxicity. Patient expressed understanding. A formal time-out was conducted consistent with institution rules.  Monitors were applied, and minimal sedation used (see nursing record). The site was prepped with skin prep and allowed to dry, and sterile gloves were used. A high frequency linear ultrasound probe with probe cover was utilized throughout. C5-7 nerve roots located and appeared anatomically normal, local anesthetic injected around them, and echogenic block needle trajectory was monitored throughout. Aspiration performed every 5ml. Lung and blood vessels were avoided. All injections were performed without resistance and free of blood and paresthesias. The patient tolerated the procedure well.  Injectate:  20ml exparel + 10ml 0.5% bupivacaine

## 2023-08-22 NOTE — Discharge Instructions (Addendum)
Orthopedic discharge instructions: Keep dressing dry and intact.  May shower after dressing changed on post-op day #4 (Monday).  Cover staples with Band-Aids after drying off. Apply ice frequently to shoulder. Take oxycodone as prescribed when needed.  May supplement with ES Tylenol if necessary. Keep shoulder immobilizer on at all times except may remove for bathing purposes. Follow-up in 10-14 days or as scheduled.   DeRoyal Abductor sling instructions and diagram: (Blue ball)  Youtube video: https://youtu.be/[email or web address]  Please contact your surgeon's office if you have any questions about this shoulder immobilizer.        Interscalene Nerve Block (ISNB) Discharge Instructions    For your surgery you have received an Interscalene Nerve Block. Nerve Blocks affect many types of nerves, including nerves that control movement, pain and normal sensation.  You may experience feelings such as numbness, tingling, heaviness, weakness or the inability to move your arm or the feeling or sensation that your arm has "fallen asleep". A nerve block can last for 2 - 36 hours or more depending on the medication used.  Usually the weakness wears off first.  The tingling and heaviness usually wear off next.  Finally you may start to notice pain.  Keep in mind that this may occur in any order.  once a nerve block starts to wear off it is usually completely gone within 60 minutes. ISNB may cause mild shortness of breath, a hoarse voice, blurry vision, unequal pupils, or drooping of the face on the same side as the nerve block.  These symptoms will usually go away within 12 hours.  Very rarely the procedure itself can cause mild seizures. If needed, your surgeon will give you a prescription for pain medication.  It will take about 60 minutes for the oral pain medication to become fully effective.  So, it is recommended that you start taking this medication before the nerve block first begins to wear off, or  when you first begin to feel discomfort. Keep in mind that nerve blocks often wear off in the middle of the night.   If you are going to bed and the block has not started to wear off or you have not started to have any discomfort, consider setting an alarm for 2 to 3 hours, so you can assess your block.  If you notice the block is wearing off or you are starting to have discomfort, you can take your pain medication. Take your pain medication only as prescribed.  Pain medication can cause sedation and decrease your breathing if you take more than you need for the level of pain that you have. Nausea is a common side effect of many pain medications.  You may want to eat something before taking your pain medicine to prevent nausea. After an Interscalene nerve block, you cannot feel pain, pressure or extremes in temperature in the effected arm.  Because your arm is numb it is at an increased risk for injury.  To decrease the possibility of injury, please practice the following:  While you are awake change the position of your arm frequently to prevent too much pressure on any one area for prolonged periods of time.  If you have a cast or tight dressing, check the color or your fingers every couple of hours.  Call your surgeon with the appearance of any discoloration (white or blue). If you are given a sling to wear before you go home, please wear it  at all times until the block has completely worn  off.  Do not get up at night without your sling. If you experience any problems or concerns, please contact your surgeon's office. If you experience severe or prolonged shortness of breath go to the nearest emergency department.  AMBULATORY SURGERY  DISCHARGE INSTRUCTIONS   The drugs that you were given will stay in your system until tomorrow so for the next 24 hours you should not:  Drive an automobile Make any legal decisions Drink any alcoholic beverage   You may resume regular meals tomorrow.  Today  it is better to start with liquids and gradually work up to solid foods.  You may eat anything you prefer, but it is better to start with liquids, then soup and crackers, and gradually work up to solid foods.   Please notify your doctor immediately if you have any unusual bleeding, trouble breathing, redness and pain at the surgery site, drainage, fever, or pain not relieved by medication.    Additional Instructions: PLEASE LEAVE TEAL BRACELET ON 4 DAYS        Please contact your physician with any problems or Same Day Surgery at [phone], Monday through Friday 6 am to 4 pm, or [place] at [facility] number at [phone].

## 2023-08-23 ENCOUNTER — Encounter: Payer: Self-pay | Admitting: Surgery

## 2023-08-23 NOTE — Anesthesia Postprocedure Evaluation (Signed)
Anesthesia Post Note  Patient: Monique Patton  Procedure(s) Performed: LEFT SHOULDER ARTHROSCOPY WITH DEBRIDEMENT, DECOMPRESSION, DISTAL CLAVICLE EXCISION, BICEPS TENODESIS, AND MINI OPEN ROTATOR CUFF REPAIR. (Left)  Patient location during evaluation: PACU Anesthesia Type: General Level of consciousness: awake and alert Pain management: pain level controlled Vital Signs Assessment: post-procedure vital signs reviewed and stable Respiratory status: spontaneous breathing, nonlabored ventilation and respiratory function stable Cardiovascular status: blood pressure returned to baseline and stable Postop Assessment: no apparent nausea or vomiting Anesthetic complications: no   No notable events documented.   Last Vitals:  Vitals:   08/22/23 1606 08/22/23 1643  BP: (!) 159/108 (!) 156/104  Pulse: 74   Resp: 15   Temp: (!) 36.1 C   SpO2: 99%     Last Pain:  Vitals:   08/22/23 1606  TempSrc: Temporal  PainSc: 0-No pain                 Foye Deer

## 2023-08-28 ENCOUNTER — Other Ambulatory Visit: Payer: Self-pay

## 2023-09-04 ENCOUNTER — Other Ambulatory Visit: Payer: Self-pay | Admitting: General Practice

## 2023-09-04 MED ORDER — CYCLOBENZAPRINE HCL 10 MG PO TABS: 10.0000 mg | ORAL_TABLET | Freq: Three times a day (TID) | ORAL | 3 refills | Status: DC

## 2023-09-18 ENCOUNTER — Telehealth: Payer: Self-pay | Admitting: Family

## 2023-09-18 ENCOUNTER — Telehealth: Payer: Self-pay | Admitting: Cardiology

## 2023-09-18 NOTE — Telephone Encounter (Signed)
Patient came in stating that the letter you wrote for her in concerns to mother Rivka Barbara Long needed to have the dx of dementia in the letter. Can you please add that dx to the letter and pt states that she has to have this turned in by tomorrow 09/19/23. Please call patient once letter is ready [phone].

## 2023-09-18 NOTE — Telephone Encounter (Signed)
Done in error.

## 2023-09-18 NOTE — Telephone Encounter (Signed)
Or Bari stated that you email to her and she can print out, which ever is easier. Email [email or web address]

## 2023-10-08 ENCOUNTER — Telehealth: Payer: Self-pay

## 2023-10-08 NOTE — Telephone Encounter (Signed)
Patient is requesting to get rx for wegovy sent to [facility] please advise

## 2023-10-10 ENCOUNTER — Other Ambulatory Visit: Payer: Self-pay | Admitting: Cardiology

## 2023-10-10 MED ORDER — WEGOVY 0.25 MG/0.5ML ~~LOC~~ SOAJ: 0.2500 mg | SUBCUTANEOUS | 3 refills | Status: DC

## 2023-10-24 ENCOUNTER — Other Ambulatory Visit: Payer: Self-pay

## 2023-10-24 MED ORDER — UBRELVY 100 MG PO TABS: 1.0000 | ORAL_TABLET | ORAL | 1 refills | Status: AC | PRN

## 2023-11-26 ENCOUNTER — Ambulatory Visit: Payer: BC Managed Care – PPO | Admitting: Podiatry

## 2023-12-09 ENCOUNTER — Ambulatory Visit: Payer: BC Managed Care – PPO | Admitting: Cardiology

## 2023-12-09 ENCOUNTER — Other Ambulatory Visit: Payer: BC Managed Care – PPO

## 2023-12-09 DIAGNOSIS — E782 Mixed hyperlipidemia: Secondary | ICD-10-CM

## 2023-12-09 DIAGNOSIS — R7303 Prediabetes: Secondary | ICD-10-CM

## 2023-12-10 LAB — CBC WITH DIFFERENTIAL/PLATELET
Basophils Absolute: 0.1 10*3/uL (ref 0.0–0.2)
Basos: 1 %
EOS (ABSOLUTE): 0.3 10*3/uL (ref 0.0–0.4)
Eos: 5 %
Hematocrit: 38.3 % (ref 34.0–46.6)
Hemoglobin: 12.3 g/dL (ref 11.1–15.9)
Immature Grans (Abs): 0 10*3/uL (ref 0.0–0.1)
Immature Granulocytes: 0 %
Lymphocytes Absolute: 2 10*3/uL (ref 0.7–3.1)
Lymphs: 36 %
MCH: 27.9 pg (ref 26.6–33.0)
MCHC: 32.1 g/dL (ref 31.5–35.7)
MCV: 87 fL (ref 79–97)
Monocytes Absolute: 0.4 10*3/uL (ref 0.1–0.9)
Monocytes: 8 %
Neutrophils Absolute: 2.8 10*3/uL (ref 1.4–7.0)
Neutrophils: 50 %
Platelets: 236 10*3/uL (ref 150–450)
RBC: 4.41 x10E6/uL (ref 3.77–5.28)
RDW: 13.2 % (ref 11.7–15.4)
WBC: 5.5 10*3/uL (ref 3.4–10.8)

## 2023-12-10 LAB — CMP14+EGFR
ALT: 13 [IU]/L (ref 0–32)
AST: 17 [IU]/L (ref 0–40)
Albumin: 3.9 g/dL (ref 3.8–4.9)
Alkaline Phosphatase: 112 [IU]/L (ref 44–121)
BUN/Creatinine Ratio: 15 (ref 9–23)
BUN: 14 mg/dL (ref 6–24)
Bilirubin Total: 0.3 mg/dL (ref 0.0–1.2)
CO2: 26 mmol/L (ref 20–29)
Calcium: 9.6 mg/dL (ref 8.7–10.2)
Chloride: 110 mmol/L — ABNORMAL HIGH (ref 96–106)
Creatinine, Ser: 0.91 mg/dL (ref 0.57–1.00)
Globulin, Total: 2.1 g/dL (ref 1.5–4.5)
Glucose: 98 mg/dL (ref 70–99)
Potassium: 5.2 mmol/L (ref 3.5–5.2)
Sodium: 150 mmol/L — ABNORMAL HIGH (ref 134–144)
Total Protein: 6 g/dL (ref 6.0–8.5)
eGFR: 73 mL/min/{1.73_m2} (ref >59)

## 2023-12-10 LAB — TSH: TSH: 1.41 u[IU]/mL (ref 0.450–4.500)

## 2023-12-10 LAB — LIPID PANEL
Chol/HDL Ratio: 2.6 {ratio} (ref 0.0–4.4)
Cholesterol, Total: 172 mg/dL (ref 100–199)
HDL: 67 mg/dL (ref >39)
LDL Chol Calc (NIH): 92 mg/dL (ref 0–99)
Triglycerides: 65 mg/dL (ref 0–149)
VLDL Cholesterol Cal: 13 mg/dL (ref 5–40)

## 2023-12-10 LAB — HEMOGLOBIN A1C
Est. average glucose Bld gHb Est-mCnc: 126 mg/dL
Hgb A1c MFr Bld: 6 % — ABNORMAL HIGH (ref 4.8–5.6)

## 2023-12-12 ENCOUNTER — Ambulatory Visit: Payer: BC Managed Care – PPO | Admitting: Cardiology

## 2023-12-12 ENCOUNTER — Encounter: Payer: Self-pay | Admitting: Cardiology

## 2023-12-12 VITALS — BP 120/72 | HR 111 | Ht 67.0 in | Wt 299.4 lb

## 2023-12-12 DIAGNOSIS — E782 Mixed hyperlipidemia: Secondary | ICD-10-CM | POA: Diagnosis not present

## 2023-12-12 DIAGNOSIS — R7303 Prediabetes: Secondary | ICD-10-CM

## 2023-12-12 DIAGNOSIS — Z6841 Body Mass Index (BMI) 40.0 and over, adult: Secondary | ICD-10-CM | POA: Diagnosis not present

## 2023-12-12 DIAGNOSIS — Z0130 Encounter for examination of blood pressure without abnormal findings: Secondary | ICD-10-CM

## 2023-12-12 MED ORDER — ROSUVASTATIN CALCIUM 20 MG PO TABS: 20.0000 mg | ORAL_TABLET | Freq: Every day | ORAL | 1 refills | Status: DC

## 2023-12-12 MED ORDER — VITAMIN D3 125 MCG (5000 UT) PO CAPS: 5000.0000 [IU] | ORAL_CAPSULE | Freq: Every day | ORAL | 3 refills | Status: DC

## 2023-12-12 NOTE — Progress Notes (Signed)
Established Patient Office Visit  Subjective:  Patient ID: Monique Patton, female    DOB: 1965/10/16  Age: 58 y.o. MRN: [id number]  Chief Complaint  Patient presents with   Follow-up    4 month follow up    Patient in office for 4 month follow up, discuss recent lab results. Patient doing well, no complaints today. Discussed recent lab work. Patient has been out of her rosuvastatin, will refill. LDL currently at goal. Hgb A1c elevated. Unable to take Wengovy, not covered by insurance. Patient requesting to try phentermine. Patient understands phentermine is taken in conjunction with diet and exercise.     No other concerns at this time.   Past Medical History:  Diagnosis Date   Allergy    Anemia    Arthritis    DDD (degenerative disc disease), cervical    Eczema    Full thickness rotator cuff tear    H/O gastric bypass    Heart murmur    since birth   HLD (hyperlipidemia)    Impingement syndrome of shoulder region, left    Persistent headaches    Pre-diabetes    Sleep apnea    had surgery Jenna Luo   Vitamin D deficiency     Past Surgical History:  Procedure Laterality Date   ABDOMINAL HYSTERECTOMY     ADENIDECTOMY     ANTERIOR CERVICAL DECOMP/DISCECTOMY FUSION  03/13/2012   Procedure: ANTERIOR CERVICAL DECOMPRESSION/DISCECTOMY FUSION 2 LEVELS;  Surgeon: Hewitt Shorts, MD;  Location: [facility];  Service: Neurosurgery;  Laterality: N/A;  Cervical Five-Six Cervical Six-Seven Anterior Cervical Decompression with fusion plating and bonegraft   COLONOSCOPY WITH PROPOFOL N/A 01/07/2018   Procedure: COLONOSCOPY WITH PROPOFOL;  Surgeon: Toledo, Boykin Nearing, MD;  Location: [facility];  Service: Gastroenterology;  Laterality: N/A;   GASTRIC BYPASS  2003   LUMBAR LAMINECTOMY/DECOMPRESSION MICRODISCECTOMY Left 05/14/2013   Procedure: LUMBAR LAMINECTOMY/DECOMPRESSION MICRODISCECTOMY 1 LEVEL;  Surgeon: Hewitt Shorts, MD;  Location: [facility];  Service: Neurosurgery;   Laterality: Left;  LEFT Lumbar four-five4 extraforaminal microdiskectomy   POSTERIOR CERVICAL FUSION/FORAMINOTOMY N/A 11/05/2013   Procedure: Cervical five-six posterior arthrodesis and instrumentation;  Surgeon: Hewitt Shorts, MD;  Location: [facility];  Service: Neurosurgery;  Laterality: N/A;  Cervical five-six posterior arthrodesis and instrumentation   SHOULDER ARTHROSCOPY WITH SUBACROMIAL DECOMPRESSION, ROTATOR CUFF REPAIR AND BICEP TENDON REPAIR Left 08/22/2023   Procedure: LEFT SHOULDER ARTHROSCOPY WITH DEBRIDEMENT, DECOMPRESSION, DISTAL CLAVICLE EXCISION, BICEPS TENODESIS, AND MINI OPEN ROTATOR CUFF REPAIR.;  Surgeon: Christena Flake, MD;  Location: [facility];  Service: Orthopedics;  Laterality: Left;   TONSILLECTOMY     TOTAL KNEE ARTHROPLASTY Left 11/09/2021   Procedure: TOTAL KNEE ARTHROPLASTY;  Surgeon: Kennedy Bucker, MD;  Location: [facility];  Service: Orthopedics;  Laterality: Left;   TOTAL KNEE ARTHROPLASTY Right 05/29/2022   Procedure: TOTAL KNEE ARTHROPLASTY;  Surgeon: Kennedy Bucker, MD;  Location: [facility];  Service: Orthopedics;  Laterality: Right;    Social History   Socioeconomic History   Marital status: Married    Spouse name: Onalee Hua   Number of children: 1   Years of education: Not on file   Highest education level: Not on file  Occupational History   Not on file  Tobacco Use   Smoking status: Former    Current packs/day: 0.00    Average packs/day: 0.1 packs/day for 35.0 years (3.5 ttl pk-yrs)    Types: Cigarettes    Start date: 01/07/1975    Quit date: 01/07/2010  Years since quitting: 13.9   Smokeless tobacco: Never  Vaping Use   Vaping status: Never Used  Substance and Sexual Activity   Alcohol use: No   Drug use: No   Sexual activity: Not on file  Other Topics Concern   Not on file  Social History Narrative   Not on file   Social Drivers of Health   Financial Resource Strain: Not on file  Food Insecurity: Not on file  Transportation Needs: Not  on file  Physical Activity: Not on file  Stress: Not on file  Social Connections: Not on file  Intimate Partner Violence: Not on file    Family History  Problem Relation Age of Onset   Hyperlipidemia Father    Hypertension Father    Diabetes Father    Breast cancer Neg Hx     Allergies  Allergen Reactions   Other Itching and Rash    Cat Dander   Fish Allergy Rash   Penicillins Itching and Rash    TOLERATED CEFAZOLIN   Shellfish Allergy Rash    Outpatient Medications Prior to Visit  Medication Sig   acetaminophen (TYLENOL) 500 MG tablet Take 2 tablets (1,000 mg total) by mouth every 6 (six) hours.   cyclobenzaprine (FLEXERIL) 10 MG tablet Take 1 tablet (10 mg total) by mouth 3 (three) times daily.   ferrous sulfate 325 (65 FE) MG tablet Take 1 tablet (325 mg total) by mouth daily.   gabapentin (NEURONTIN) 300 MG capsule Take 300 mg by mouth 3 (three) times daily.   levocetirizine (XYZAL) 5 MG tablet Take 1 tablet (5 mg total) by mouth every evening.   oxyCODONE (ROXICODONE) 5 MG immediate release tablet Take 1-2 tablets (5-10 mg total) by mouth every 4 (four) hours as needed for moderate pain or severe pain.   Ubrogepant (UBRELVY) 100 MG TABS Take 1 tablet (100 mg total) by mouth as needed.   [DISCONTINUED] Cholecalciferol (VITAMIN D3) 125 MCG (5000 UT) CAPS Take 1 capsule (5,000 Units total) by mouth daily.   [DISCONTINUED] rosuvastatin (CRESTOR) 20 MG tablet Take 1 tablet (20 mg total) by mouth daily.   [DISCONTINUED] Semaglutide-Weight Management (WEGOVY) 0.25 MG/0.5ML SOAJ Inject 0.25 mg into the skin once a week. (Patient not taking: Reported on 12/12/2023)   No facility-administered medications prior to visit.    Review of Systems  Constitutional: Negative.   HENT: Negative.    Eyes: Negative.   Respiratory: Negative.  Negative for shortness of breath.   Cardiovascular: Negative.  Negative for chest pain.  Gastrointestinal: Negative.  Negative for abdominal pain,  constipation and diarrhea.  Genitourinary: Negative.   Musculoskeletal:  Negative for joint pain and myalgias.  Skin: Negative.   Neurological: Negative.  Negative for dizziness and headaches.  Endo/Heme/Allergies: Negative.   All other systems reviewed and are negative.      Objective:   BP 120/72   Pulse (!) 111   Ht 5\' 7"  (1.702 m)   Wt 299 lb 6.4 oz (135.8 kg)   SpO2 94%   BMI 46.89 kg/m   Vitals:   12/12/23 1424  BP: 120/72  Pulse: (!) 111  Height: 5\' 7"  (1.702 m)  Weight: 299 lb 6.4 oz (135.8 kg)  SpO2: 94%  BMI (Calculated): 46.88    Physical Exam Vitals and nursing note reviewed.  Constitutional:      Appearance: Normal appearance. She is normal weight.  HENT:     Head: Normocephalic and atraumatic.     Nose: Nose normal.  Mouth/Throat:     Mouth: Mucous membranes are moist.  Eyes:     Extraocular Movements: Extraocular movements intact.     Conjunctiva/sclera: Conjunctivae normal.     Pupils: Pupils are equal, round, and reactive to light.  Cardiovascular:     Rate and Rhythm: Normal rate and regular rhythm.     Pulses: Normal pulses.     Heart sounds: Normal heart sounds.  Pulmonary:     Effort: Pulmonary effort is normal.     Breath sounds: Normal breath sounds.  Abdominal:     General: Abdomen is flat. Bowel sounds are normal.     Palpations: Abdomen is soft.  Musculoskeletal:        General: Normal range of motion.     Cervical back: Normal range of motion.  Skin:    General: Skin is warm and dry.  Neurological:     General: No focal deficit present.     Mental Status: She is alert and oriented to person, place, and time.  Psychiatric:        Mood and Affect: Mood normal.        Behavior: Behavior normal.        Thought Content: Thought content normal.        Judgment: Judgment normal.      No results found for any visits on 12/12/23.  Recent Results (from the past 2160 hours)  CBC with Diff     Status: None   Collection Time:  12/09/23  9:38 AM  Result Value Ref Range   WBC 5.5 3.4 - 10.8 x10E3/uL   RBC 4.41 3.77 - 5.28 x10E6/uL   Hemoglobin 12.3 11.1 - 15.9 g/dL   Hematocrit 16.1 09.6 - 46.6 %   MCV 87 79 - 97 fL   MCH 27.9 26.6 - 33.0 pg   MCHC 32.1 31.5 - 35.7 g/dL   RDW 04.5 40.9 - 81.1 %   Platelets 236 150 - 450 x10E3/uL   Neutrophils 50 Not Estab. %   Lymphs 36 Not Estab. %   Monocytes 8 Not Estab. %   Eos 5 Not Estab. %   Basos 1 Not Estab. %   Neutrophils Absolute 2.8 1.4 - 7.0 x10E3/uL   Lymphocytes Absolute 2.0 0.7 - 3.1 x10E3/uL   Monocytes Absolute 0.4 0.1 - 0.9 x10E3/uL   EOS (ABSOLUTE) 0.3 0.0 - 0.4 x10E3/uL   Basophils Absolute 0.1 0.0 - 0.2 x10E3/uL   Immature Granulocytes 0 Not Estab. %   Immature Grans (Abs) 0.0 0.0 - 0.1 x10E3/uL  Lipid panel     Status: None   Collection Time: 12/09/23  9:38 AM  Result Value Ref Range   Cholesterol, Total 172 100 - 199 mg/dL   Triglycerides 65 0 - 149 mg/dL   HDL 67 >91 mg/dL   VLDL Cholesterol Cal 13 5 - 40 mg/dL   LDL Chol Calc (NIH) 92 0 - 99 mg/dL   Chol/HDL Ratio 2.6 0.0 - 4.4 ratio    Comment:                                   T. Chol/HDL Ratio                                             Men  Women  1/2 Avg.Risk  3.4    3.3                                   Avg.Risk  5.0    4.4                                2X Avg.Risk  9.6    7.1                                3X Avg.Risk 23.4   11.0   CMP14+EGFR     Status: Abnormal   Collection Time: 12/09/23  9:38 AM  Result Value Ref Range   Glucose 98 70 - 99 mg/dL   BUN 14 6 - 24 mg/dL   Creatinine, Ser 1.61 0.57 - 1.00 mg/dL   eGFR 73 >09 UE/AVW/0.98   BUN/Creatinine Ratio 15 9 - 23   Sodium 150 (H) 134 - 144 mmol/L   Potassium 5.2 3.5 - 5.2 mmol/L   Chloride 110 (H) 96 - 106 mmol/L   CO2 26 20 - 29 mmol/L   Calcium 9.6 8.7 - 10.2 mg/dL   Total Protein 6.0 6.0 - 8.5 g/dL   Albumin 3.9 3.8 - 4.9 g/dL   Globulin, Total 2.1 1.5 - 4.5 g/dL   Bilirubin Total  0.3 0.0 - 1.2 mg/dL   Alkaline Phosphatase 112 44 - 121 IU/L   AST 17 0 - 40 IU/L   ALT 13 0 - 32 IU/L  TSH     Status: None   Collection Time: 12/09/23  9:38 AM  Result Value Ref Range   TSH 1.410 0.450 - 4.500 uIU/mL  Hemoglobin A1c     Status: Abnormal   Collection Time: 12/09/23  9:38 AM  Result Value Ref Range   Hgb A1c MFr Bld 6.0 (H) 4.8 - 5.6 %    Comment:          Prediabetes: 5.7 - 6.4          Diabetes: >6.4          Glycemic control for adults with diabetes: <7.0    Est. average glucose Bld gHb Est-mCnc 126 mg/dL      Assessment & Plan:  Restart rosuvastatin.  Phentermine for weight loss. Strict diet and exercise, decrease salt intake.   Problem List Items Addressed This Visit       Other   Body mass index (BMI) 45.0-49.9, adult (HCC) - Primary   Mixed hyperlipidemia   Relevant Medications   rosuvastatin (CRESTOR) 20 MG tablet   Prediabetes    Return in about 4 weeks (around 01/09/2024).   Total time spent: 25 minutes  [employer], NP  12/12/2023   This document may have been prepared by Dragon Voice Recognition software and as such may include unintentional dictation errors.

## 2023-12-17 ENCOUNTER — Other Ambulatory Visit: Payer: Self-pay | Admitting: Family

## 2023-12-18 ENCOUNTER — Telehealth: Payer: Self-pay | Admitting: Cardiology

## 2023-12-18 MED ORDER — PHENTERMINE HCL 37.5 MG PO TABS: 37.5000 mg | ORAL_TABLET | Freq: Every day | ORAL | 0 refills | Status: DC

## 2023-12-18 NOTE — Telephone Encounter (Signed)
Pt said her "diet medication" needs to be called from her visit on 12/12/23 Good pharm still [facility] on [address]

## 2024-01-03 ENCOUNTER — Ambulatory Visit (INDEPENDENT_AMBULATORY_CARE_PROVIDER_SITE_OTHER): Payer: 59 | Admitting: Podiatry

## 2024-01-03 ENCOUNTER — Encounter: Payer: Self-pay | Admitting: Podiatry

## 2024-01-03 DIAGNOSIS — M2041 Other hammer toe(s) (acquired), right foot: Secondary | ICD-10-CM | POA: Diagnosis not present

## 2024-01-03 DIAGNOSIS — M2042 Other hammer toe(s) (acquired), left foot: Secondary | ICD-10-CM | POA: Diagnosis not present

## 2024-01-03 NOTE — Progress Notes (Signed)
 Chief Complaint  Patient presents with   Callouses    I've got corns on my big toe and my pinky toes.  I have calluses on the bottom of both feet.     Subjective: 59 y.o. female presenting to the office today for evaluation of symptomatic calluses to the bilateral feet.  They are very tender and painful especially when she goes barefoot.  Presenting for further treatment evaluation   Past Medical History:  Diagnosis Date   Allergy    Anemia    Arthritis    DDD (degenerative disc disease), cervical    Eczema    Full thickness rotator cuff tear    H/O gastric bypass    Heart murmur    since birth   HLD (hyperlipidemia)    Impingement syndrome of shoulder region, left    Persistent headaches    Pre-diabetes    Sleep apnea    had surgery /resolved   Vitamin D  deficiency     Past Surgical History:  Procedure Laterality Date   ABDOMINAL HYSTERECTOMY     ADENIDECTOMY     ANTERIOR CERVICAL DECOMP/DISCECTOMY FUSION  03/13/2012   Procedure: ANTERIOR CERVICAL DECOMPRESSION/DISCECTOMY FUSION 2 LEVELS;  Surgeon: Lamar LELON Peaches, MD;  Location: [facility];  Service: Neurosurgery;  Laterality: N/A;  Cervical Five-Six Cervical Six-Seven Anterior Cervical Decompression with fusion plating and bonegraft   COLONOSCOPY WITH PROPOFOL  N/A 01/07/2018   Procedure: COLONOSCOPY WITH PROPOFOL ;  Surgeon: Toledo, Ladell POUR, MD;  Location: [facility];  Service: Gastroenterology;  Laterality: N/A;   GASTRIC BYPASS  2003   LUMBAR LAMINECTOMY/DECOMPRESSION MICRODISCECTOMY Left 05/14/2013   Procedure: LUMBAR LAMINECTOMY/DECOMPRESSION MICRODISCECTOMY 1 LEVEL;  Surgeon: Lamar LELON Peaches, MD;  Location: [facility];  Service: Neurosurgery;  Laterality: Left;  LEFT Lumbar four-five4 extraforaminal microdiskectomy   POSTERIOR CERVICAL FUSION/FORAMINOTOMY N/A 11/05/2013   Procedure: Cervical five-six posterior arthrodesis and instrumentation;  Surgeon: Lamar LELON Peaches, MD;  Location: [facility];  Service: Neurosurgery;  Laterality: N/A;  Cervical five-six posterior arthrodesis and instrumentation   SHOULDER ARTHROSCOPY WITH SUBACROMIAL DECOMPRESSION, ROTATOR CUFF REPAIR AND BICEP TENDON REPAIR Left 08/22/2023   Procedure: LEFT SHOULDER ARTHROSCOPY WITH DEBRIDEMENT, DECOMPRESSION, DISTAL CLAVICLE EXCISION, BICEPS TENODESIS, AND MINI OPEN ROTATOR CUFF REPAIR.;  Surgeon: Edie Norleen PARAS, MD;  Location: [facility];  Service: Orthopedics;  Laterality: Left;   TONSILLECTOMY     TOTAL KNEE ARTHROPLASTY Left 11/09/2021   Procedure: TOTAL KNEE ARTHROPLASTY;  Surgeon: Kathlynn Sharper, MD;  Location: [facility];  Service: Orthopedics;  Laterality: Left;   TOTAL KNEE ARTHROPLASTY Right 05/29/2022   Procedure: TOTAL KNEE ARTHROPLASTY;  Surgeon: Kathlynn Sharper, MD;  Location: [facility];  Service: Orthopedics;  Laterality: Right;    Allergies  Allergen Reactions   Other Itching and Rash    Cat Dander   Fish Allergy Rash   Penicillins Itching and Rash    TOLERATED CEFAZOLIN    Shellfish Allergy Rash     Objective:  Physical Exam General: Alert and oriented x3 in no acute distress  Dermatology: Hyperkeratotic lesion(s) present on the bilateral feet as well as the fifth digits. Pain on palpation with a central nucleated core noted. Skin is warm, dry and supple bilateral lower extremities. Negative for open lesions or macerations.  Vascular: Palpable pedal pulses bilaterally. No edema or erythema noted. Capillary refill within normal limits.  Neurological: Grossly intact via light touch  Musculoskeletal Exam: Pain on palpation at the keratotic lesion(s) noted.  Adductovarus hammertoes noted fifth digits bilateral range  of motion within normal limits bilateral. Muscle strength 5/5 in all groups bilateral.  Assessment: 1.  Symptomatic benign skin lesion 2.  Hammertoe deformity bilateral fifth digits  Plan of Care:  -Patient evaluated -Excisional debridement of keratoic lesion(s) using a chisel  blade was performed without incident.  -Salicylic acid applied with a bandaid -Return to the clinic PRN.   Thresa EMERSON Sar, DPM [facility]  Dr. Thresa EMERSON Sar, DPM    [address] [place], [place] [address]                Office [phone]  Fax [phone]

## 2024-01-09 ENCOUNTER — Encounter: Payer: Self-pay | Admitting: Cardiology

## 2024-01-09 ENCOUNTER — Ambulatory Visit: Payer: 59 | Admitting: Cardiology

## 2024-01-09 VITALS — BP 128/74 | HR 92 | Ht 66.0 in | Wt 299.0 lb

## 2024-01-09 DIAGNOSIS — R7303 Prediabetes: Secondary | ICD-10-CM

## 2024-01-09 DIAGNOSIS — Z0130 Encounter for examination of blood pressure without abnormal findings: Secondary | ICD-10-CM

## 2024-01-09 DIAGNOSIS — Z713 Dietary counseling and surveillance: Secondary | ICD-10-CM | POA: Insufficient documentation

## 2024-01-09 DIAGNOSIS — Z96651 Presence of right artificial knee joint: Secondary | ICD-10-CM | POA: Diagnosis not present

## 2024-01-09 DIAGNOSIS — Z6841 Body Mass Index (BMI) 40.0 and over, adult: Secondary | ICD-10-CM

## 2024-01-09 MED ORDER — LEVOCETIRIZINE DIHYDROCHLORIDE 5 MG PO TABS: 5.0000 mg | ORAL_TABLET | Freq: Every evening | ORAL | 1 refills | Status: DC

## 2024-01-09 MED ORDER — VITAMIN D3 125 MCG (5000 UT) PO CAPS: 5000.0000 [IU] | ORAL_CAPSULE | Freq: Every day | ORAL | 3 refills | Status: DC

## 2024-01-09 MED ORDER — FERROUS SULFATE 325 (65 FE) MG PO TABS: 325.0000 mg | ORAL_TABLET | Freq: Every day | ORAL | 3 refills | Status: DC

## 2024-01-09 NOTE — Progress Notes (Signed)
 Established Patient Office Visit  Subjective:  Patient ID: Monique Patton, female    DOB: 1965/09/06  Age: 59 y.o. MRN: [id number]  Chief Complaint  Patient presents with   Follow-up    4 Week Follow Up    Patient in office for 4 week follow up. Patient has been taking phentermine  for 3 weeks, has maintained current weight over the holidays. Patient states she is tolerating phentermine . Patient understands phentermine  is taken in conjunction with diet and exercise. Will have patient return in 2 weeks for a weight check.     No other concerns at this time.   Past Medical History:  Diagnosis Date   Allergy    Anemia    Arthritis    DDD (degenerative disc disease), cervical    Eczema    Full thickness rotator cuff tear    H/O gastric bypass    Heart murmur    since birth   HLD (hyperlipidemia)    Impingement syndrome of shoulder region, left    Persistent headaches    Pre-diabetes    Sleep apnea    had surgery /resolved   Vitamin D  deficiency     Past Surgical History:  Procedure Laterality Date   ABDOMINAL HYSTERECTOMY     ADENIDECTOMY     ANTERIOR CERVICAL DECOMP/DISCECTOMY FUSION  03/13/2012   Procedure: ANTERIOR CERVICAL DECOMPRESSION/DISCECTOMY FUSION 2 LEVELS;  Surgeon: Lamar LELON Peaches, MD;  Location: [facility];  Service: Neurosurgery;  Laterality: N/A;  Cervical Five-Six Cervical Six-Seven Anterior Cervical Decompression with fusion plating and bonegraft   COLONOSCOPY WITH PROPOFOL  N/A 01/07/2018   Procedure: COLONOSCOPY WITH PROPOFOL ;  Surgeon: Toledo, Ladell POUR, MD;  Location: [facility];  Service: Gastroenterology;  Laterality: N/A;   GASTRIC BYPASS  2003   LUMBAR LAMINECTOMY/DECOMPRESSION MICRODISCECTOMY Left 05/14/2013   Procedure: LUMBAR LAMINECTOMY/DECOMPRESSION MICRODISCECTOMY 1 LEVEL;  Surgeon: Lamar LELON Peaches, MD;  Location: [facility];  Service: Neurosurgery;  Laterality: Left;  LEFT Lumbar four-five4 extraforaminal microdiskectomy    POSTERIOR CERVICAL FUSION/FORAMINOTOMY N/A 11/05/2013   Procedure: Cervical five-six posterior arthrodesis and instrumentation;  Surgeon: Lamar LELON Peaches, MD;  Location: [facility];  Service: Neurosurgery;  Laterality: N/A;  Cervical five-six posterior arthrodesis and instrumentation   SHOULDER ARTHROSCOPY WITH SUBACROMIAL DECOMPRESSION, ROTATOR CUFF REPAIR AND BICEP TENDON REPAIR Left 08/22/2023   Procedure: LEFT SHOULDER ARTHROSCOPY WITH DEBRIDEMENT, DECOMPRESSION, DISTAL CLAVICLE EXCISION, BICEPS TENODESIS, AND MINI OPEN ROTATOR CUFF REPAIR.;  Surgeon: Edie Norleen PARAS, MD;  Location: [facility];  Service: Orthopedics;  Laterality: Left;   TONSILLECTOMY     TOTAL KNEE ARTHROPLASTY Left 11/09/2021   Procedure: TOTAL KNEE ARTHROPLASTY;  Surgeon: Kathlynn Sharper, MD;  Location: [facility];  Service: Orthopedics;  Laterality: Left;   TOTAL KNEE ARTHROPLASTY Right 05/29/2022   Procedure: TOTAL KNEE ARTHROPLASTY;  Surgeon: Kathlynn Sharper, MD;  Location: [facility];  Service: Orthopedics;  Laterality: Right;    Social History   Socioeconomic History   Marital status: Married    Spouse name: Alm   Number of children: 1   Years of education: Not on file   Highest education level: Not on file  Occupational History   Not on file  Tobacco Use   Smoking status: Former    Current packs/day: 0.00    Average packs/day: 0.1 packs/day for 35.0 years (3.5 ttl pk-yrs)    Types: Cigarettes    Start date: 01/07/1975    Quit date: 01/07/2010    Years since quitting: 14.0   Smokeless tobacco:  Never  Vaping Use   Vaping status: Never Used  Substance and Sexual Activity   Alcohol use: No   Drug use: No   Sexual activity: Not on file  Other Topics Concern   Not on file  Social History Narrative   Not on file   Social Drivers of Health   Financial Resource Strain: Not on file  Food Insecurity: Not on file  Transportation Needs: Not on file  Physical Activity: Not on file  Stress: Not on file  Social  Connections: Not on file  Intimate Partner Violence: Not on file    Family History  Problem Relation Age of Onset   Hyperlipidemia Father    Hypertension Father    Diabetes Father    Breast cancer Neg Hx     Allergies  Allergen Reactions   Other Itching and Rash    Cat Dander   Fish Allergy Rash   Penicillins Itching and Rash    TOLERATED CEFAZOLIN    Shellfish Allergy Rash    Outpatient Medications Prior to Visit  Medication Sig   acetaminophen  (TYLENOL ) 500 MG tablet Take 2 tablets (1,000 mg total) by mouth every 6 (six) hours.   cyclobenzaprine  (FLEXERIL ) 10 MG tablet Take 1 tablet (10 mg total) by mouth 3 (three) times daily.   gabapentin  (NEURONTIN ) 300 MG capsule Take 300 mg by mouth 3 (three) times daily.   phentermine  (ADIPEX-P ) 37.5 MG tablet Take 1 tablet (37.5 mg total) by mouth daily before breakfast.   rosuvastatin  (CRESTOR ) 20 MG tablet Take 1 tablet (20 mg total) by mouth daily.   Ubrogepant  (UBRELVY ) 100 MG TABS Take 1 tablet (100 mg total) by mouth as needed.   [DISCONTINUED] Cholecalciferol  (VITAMIN D3) 125 MCG (5000 UT) CAPS Take 1 capsule (5,000 Units total) by mouth daily.   [DISCONTINUED] ferrous sulfate  325 (65 FE) MG tablet Take 1 tablet (325 mg total) by mouth daily.   [DISCONTINUED] levocetirizine (XYZAL ) 5 MG tablet Take 1 tablet (5 mg total) by mouth every evening.   [DISCONTINUED] oxyCODONE  (ROXICODONE ) 5 MG immediate release tablet Take 1-2 tablets (5-10 mg total) by mouth every 4 (four) hours as needed for moderate pain or severe pain. (Patient not taking: Reported on 01/03/2024)   No facility-administered medications prior to visit.    Review of Systems  Constitutional: Negative.   HENT: Negative.    Eyes: Negative.   Respiratory: Negative.  Negative for shortness of breath.   Cardiovascular: Negative.  Negative for chest pain.  Gastrointestinal: Negative.  Negative for abdominal pain, constipation and diarrhea.  Genitourinary: Negative.    Musculoskeletal:  Negative for joint pain and myalgias.  Skin: Negative.   Neurological: Negative.  Negative for dizziness and headaches.  Endo/Heme/Allergies: Negative.   All other systems reviewed and are negative.      Objective:   BP 128/74   Pulse 92   Ht 5' 6 (1.676 m)   Wt 299 lb (135.6 kg)   SpO2 98%   BMI 48.26 kg/m   Vitals:   01/09/24 0956  BP: 128/74  Pulse: 92  Height: 5' 6 (1.676 m)  Weight: 299 lb (135.6 kg)  SpO2: 98%  BMI (Calculated): 48.28    Physical Exam Vitals and nursing note reviewed.  Constitutional:      Appearance: Normal appearance. She is normal weight.  HENT:     Head: Normocephalic and atraumatic.     Nose: Nose normal.     Mouth/Throat:     Mouth: Mucous membranes are  moist.  Eyes:     Extraocular Movements: Extraocular movements intact.     Conjunctiva/sclera: Conjunctivae normal.     Pupils: Pupils are equal, round, and reactive to light.  Cardiovascular:     Rate and Rhythm: Normal rate and regular rhythm.     Pulses: Normal pulses.     Heart sounds: Normal heart sounds.  Pulmonary:     Effort: Pulmonary effort is normal.     Breath sounds: Normal breath sounds.  Abdominal:     General: Abdomen is flat. Bowel sounds are normal.     Palpations: Abdomen is soft.  Musculoskeletal:        General: Normal range of motion.     Cervical back: Normal range of motion.  Skin:    General: Skin is warm and dry.  Neurological:     General: No focal deficit present.     Mental Status: She is alert and oriented to person, place, and time.  Psychiatric:        Mood and Affect: Mood normal.        Behavior: Behavior normal.        Thought Content: Thought content normal.        Judgment: Judgment normal.      No results found for any visits on 01/09/24.  Recent Results (from the past 2160 hours)  CBC with Diff     Status: None   Collection Time: 12/09/23  9:38 AM  Result Value Ref Range   WBC 5.5 3.4 - 10.8 x10E3/uL    RBC 4.41 3.77 - 5.28 x10E6/uL   Hemoglobin 12.3 11.1 - 15.9 g/dL   Hematocrit 61.6 65.9 - 46.6 %   MCV 87 79 - 97 fL   MCH 27.9 26.6 - 33.0 pg   MCHC 32.1 31.5 - 35.7 g/dL   RDW 86.7 88.2 - 84.5 %   Platelets 236 150 - 450 x10E3/uL   Neutrophils 50 Not Estab. %   Lymphs 36 Not Estab. %   Monocytes 8 Not Estab. %   Eos 5 Not Estab. %   Basos 1 Not Estab. %   Neutrophils Absolute 2.8 1.4 - 7.0 x10E3/uL   Lymphocytes Absolute 2.0 0.7 - 3.1 x10E3/uL   Monocytes Absolute 0.4 0.1 - 0.9 x10E3/uL   EOS (ABSOLUTE) 0.3 0.0 - 0.4 x10E3/uL   Basophils Absolute 0.1 0.0 - 0.2 x10E3/uL   Immature Granulocytes 0 Not Estab. %   Immature Grans (Abs) 0.0 0.0 - 0.1 x10E3/uL  Lipid panel     Status: None   Collection Time: 12/09/23  9:38 AM  Result Value Ref Range   Cholesterol, Total 172 100 - 199 mg/dL   Triglycerides 65 0 - 149 mg/dL   HDL 67 >60 mg/dL   VLDL Cholesterol Cal 13 5 - 40 mg/dL   LDL Chol Calc (NIH) 92 0 - 99 mg/dL   Chol/HDL Ratio 2.6 0.0 - 4.4 ratio    Comment:                                   T. Chol/HDL Ratio                                             Men  Women  1/2 Avg.Risk  3.4    3.3                                   Avg.Risk  5.0    4.4                                2X Avg.Risk  9.6    7.1                                3X Avg.Risk 23.4   11.0   CMP14+EGFR     Status: Abnormal   Collection Time: 12/09/23  9:38 AM  Result Value Ref Range   Glucose 98 70 - 99 mg/dL   BUN 14 6 - 24 mg/dL   Creatinine, Ser 9.08 0.57 - 1.00 mg/dL   eGFR 73 >40 fO/fpw/8.26   BUN/Creatinine Ratio 15 9 - 23   Sodium 150 (H) 134 - 144 mmol/L   Potassium 5.2 3.5 - 5.2 mmol/L   Chloride 110 (H) 96 - 106 mmol/L   CO2 26 20 - 29 mmol/L   Calcium  9.6 8.7 - 10.2 mg/dL   Total Protein 6.0 6.0 - 8.5 g/dL   Albumin 3.9 3.8 - 4.9 g/dL   Globulin, Total 2.1 1.5 - 4.5 g/dL   Bilirubin Total 0.3 0.0 - 1.2 mg/dL   Alkaline Phosphatase 112 44 - 121 IU/L   AST 17 0 -  40 IU/L   ALT 13 0 - 32 IU/L  TSH     Status: None   Collection Time: 12/09/23  9:38 AM  Result Value Ref Range   TSH 1.410 0.450 - 4.500 uIU/mL  Hemoglobin A1c     Status: Abnormal   Collection Time: 12/09/23  9:38 AM  Result Value Ref Range   Hgb A1c MFr Bld 6.0 (H) 4.8 - 5.6 %    Comment:          Prediabetes: 5.7 - 6.4          Diabetes: >6.4          Glycemic control for adults with diabetes: <7.0    Est. average glucose Bld gHb Est-mCnc 126 mg/dL      Assessment & Plan:  Weight  check in 2 weeks.   Problem List Items Addressed This Visit       Other   Body mass index (BMI) 45.0-49.9, adult (HCC)   S/P TKR (total knee replacement) using cement, right   Prediabetes   Weight loss counseling, encounter for - Primary    Return in about 2 months (around 03/08/2024) for also 2 week weight check.   Total time spent: 25 minutes  Google, NP  01/09/2024   This document may have been prepared by Dragon Voice Recognition software and as such may include unintentional dictation errors.

## 2024-01-20 ENCOUNTER — Other Ambulatory Visit: Payer: Self-pay

## 2024-01-21 MED ORDER — PHENTERMINE HCL 37.5 MG PO TABS: 37.5000 mg | ORAL_TABLET | Freq: Every day | ORAL | 0 refills | Status: DC

## 2024-01-23 ENCOUNTER — Ambulatory Visit: Payer: 59 | Admitting: Cardiology

## 2024-01-23 VITALS — Wt 296.0 lb

## 2024-01-23 DIAGNOSIS — Z713 Dietary counseling and surveillance: Secondary | ICD-10-CM

## 2024-01-24 NOTE — Progress Notes (Signed)
Continue phentermine

## 2024-02-11 ENCOUNTER — Ambulatory Visit (INDEPENDENT_AMBULATORY_CARE_PROVIDER_SITE_OTHER): Payer: 59 | Admitting: Cardiology

## 2024-02-11 ENCOUNTER — Encounter: Payer: Self-pay | Admitting: Cardiology

## 2024-02-11 DIAGNOSIS — J069 Acute upper respiratory infection, unspecified: Secondary | ICD-10-CM | POA: Diagnosis not present

## 2024-02-11 DIAGNOSIS — R6889 Other general symptoms and signs: Secondary | ICD-10-CM | POA: Diagnosis not present

## 2024-02-11 LAB — POCT XPERT XPRESS SARS COVID-2/FLU/RSV
FLU A: NEGATIVE
FLU B: NEGATIVE
RSV RNA, PCR: NEGATIVE
SARS Coronavirus 2: NEGATIVE

## 2024-02-12 ENCOUNTER — Other Ambulatory Visit: Payer: Self-pay | Admitting: Cardiology

## 2024-02-12 MED ORDER — DOXYCYCLINE HYCLATE 100 MG PO TABS: 100.0000 mg | ORAL_TABLET | Freq: Two times a day (BID) | ORAL | 0 refills | Status: AC

## 2024-02-13 ENCOUNTER — Encounter: Payer: Self-pay | Admitting: Cardiology

## 2024-02-18 ENCOUNTER — Other Ambulatory Visit: Payer: Self-pay

## 2024-02-18 MED ORDER — CYCLOBENZAPRINE HCL 10 MG PO TABS: 10.0000 mg | ORAL_TABLET | Freq: Three times a day (TID) | ORAL | 3 refills | Status: DC

## 2024-02-25 ENCOUNTER — Encounter: Payer: Self-pay | Admitting: Physical Medicine & Rehabilitation

## 2024-02-25 ENCOUNTER — Other Ambulatory Visit: Payer: Self-pay | Admitting: Physical Medicine & Rehabilitation

## 2024-02-25 DIAGNOSIS — M5441 Lumbago with sciatica, right side: Secondary | ICD-10-CM

## 2024-03-03 ENCOUNTER — Other Ambulatory Visit: Payer: Self-pay | Admitting: Family

## 2024-03-04 MED ORDER — ROSUVASTATIN CALCIUM 20 MG PO TABS: 20.0000 mg | ORAL_TABLET | Freq: Every day | ORAL | 1 refills | Status: DC

## 2024-03-07 ENCOUNTER — Ambulatory Visit
Admission: RE | Admit: 2024-03-07 | Discharge: 2024-03-07 | Disposition: A | Discharge status: Home / Self Care | Payer: 59 | Source: Ambulatory Visit | Attending: Physical Medicine & Rehabilitation | Admitting: Physical Medicine & Rehabilitation

## 2024-03-07 DIAGNOSIS — M5441 Lumbago with sciatica, right side: Secondary | ICD-10-CM

## 2024-03-09 ENCOUNTER — Encounter: Payer: Self-pay | Admitting: Cardiology

## 2024-03-09 ENCOUNTER — Ambulatory Visit: Payer: 59 | Admitting: Cardiology

## 2024-03-09 ENCOUNTER — Ambulatory Visit (INDEPENDENT_AMBULATORY_CARE_PROVIDER_SITE_OTHER): Admitting: Cardiology

## 2024-03-09 VITALS — BP 112/70 | HR 95 | Ht 66.0 in | Wt 298.0 lb

## 2024-03-09 DIAGNOSIS — Z713 Dietary counseling and surveillance: Secondary | ICD-10-CM

## 2024-03-09 DIAGNOSIS — R7303 Prediabetes: Secondary | ICD-10-CM

## 2024-03-09 DIAGNOSIS — Z6841 Body Mass Index (BMI) 40.0 and over, adult: Secondary | ICD-10-CM

## 2024-03-09 DIAGNOSIS — Z0130 Encounter for examination of blood pressure without abnormal findings: Secondary | ICD-10-CM

## 2024-03-09 DIAGNOSIS — E782 Mixed hyperlipidemia: Secondary | ICD-10-CM

## 2024-03-09 MED ORDER — WEGOVY 0.25 MG/0.5ML ~~LOC~~ SOAJ: 0.2500 mg | SUBCUTANEOUS | 3 refills | Status: DC

## 2024-03-09 MED ORDER — LEVOCETIRIZINE DIHYDROCHLORIDE 5 MG PO TABS: 5.0000 mg | ORAL_TABLET | Freq: Every evening | ORAL | 1 refills | Status: DC

## 2024-03-09 MED ORDER — FLUTICASONE PROPIONATE 50 MCG/ACT NA SUSP: 1.0000 | Freq: Every day | NASAL | 2 refills | Status: DC

## 2024-03-09 NOTE — Progress Notes (Signed)
 Established Patient Office Visit  Subjective:  Patient ID: Monique Patton, female    DOB: [birth year]  Age: 59 y.o. MRN: [id number]  Chief Complaint  Patient presents with   Follow-up    2 Months Follow Up    Patient in office for 2 month follow up. Patient taking and tolerating phentermine. Patient weight unchanged. States she has poor eating habits, typically not eating until late at night. Discussed meal prep and setting her self up for success on the weekends to have food during the week. Patient states she has new insurance that will cover [id number]. Will send in [facility].  Fating lab work prior to next visit.  Patient complains of lingering sinus drainage after being sick last month. Will refill Xyzal and send in Flonase. Patient educated on proper way to use nasal spray.     No other concerns at this time.   Past Medical History:  Diagnosis Date   Allergy    Anemia    Arthritis    DDD (degenerative disc disease), cervical    Eczema    Full thickness rotator cuff tear    H/O gastric bypass    Heart murmur    since birth   HLD (hyperlipidemia)    Impingement syndrome of shoulder region, left    Persistent headaches    Pre-diabetes    Sleep apnea    had surgery Jenna Luo   Vitamin D deficiency     Past Surgical History:  Procedure Laterality Date   ABDOMINAL HYSTERECTOMY     ADENIDECTOMY     ANTERIOR CERVICAL DECOMP/DISCECTOMY FUSION  03/13/2012   Procedure: ANTERIOR CERVICAL DECOMPRESSION/DISCECTOMY FUSION 2 LEVELS;  Surgeon: Hewitt Shorts, MD;  Location: [facility];  Service: Neurosurgery;  Laterality: N/A;  Cervical Five-Six Cervical Six-Seven Anterior Cervical Decompression with fusion plating and bonegraft   COLONOSCOPY WITH PROPOFOL N/A 01/07/2018   Procedure: COLONOSCOPY WITH PROPOFOL;  Surgeon: Toledo, Boykin Nearing, MD;  Location: [facility];  Service: Gastroenterology;  Laterality: N/A;   GASTRIC BYPASS  2003   LUMBAR LAMINECTOMY/DECOMPRESSION  MICRODISCECTOMY Left 05/14/2013   Procedure: LUMBAR LAMINECTOMY/DECOMPRESSION MICRODISCECTOMY 1 LEVEL;  Surgeon: Hewitt Shorts, MD;  Location: [facility];  Service: Neurosurgery;  Laterality: Left;  LEFT Lumbar four-five4 extraforaminal microdiskectomy   POSTERIOR CERVICAL FUSION/FORAMINOTOMY N/A 11/05/2013   Procedure: Cervical five-six posterior arthrodesis and instrumentation;  Surgeon: Hewitt Shorts, MD;  Location: [facility];  Service: Neurosurgery;  Laterality: N/A;  Cervical five-six posterior arthrodesis and instrumentation   SHOULDER ARTHROSCOPY WITH SUBACROMIAL DECOMPRESSION, ROTATOR CUFF REPAIR AND BICEP TENDON REPAIR Left 08/22/2023   Procedure: LEFT SHOULDER ARTHROSCOPY WITH DEBRIDEMENT, DECOMPRESSION, DISTAL CLAVICLE EXCISION, BICEPS TENODESIS, AND MINI OPEN ROTATOR CUFF REPAIR.;  Surgeon: Christena Flake, MD;  Location: [facility];  Service: Orthopedics;  Laterality: Left;   TONSILLECTOMY     TOTAL KNEE ARTHROPLASTY Left 11/09/2021   Procedure: TOTAL KNEE ARTHROPLASTY;  Surgeon: Kennedy Bucker, MD;  Location: [facility];  Service: Orthopedics;  Laterality: Left;   TOTAL KNEE ARTHROPLASTY Right 05/29/2022   Procedure: TOTAL KNEE ARTHROPLASTY;  Surgeon: Kennedy Bucker, MD;  Location: [facility];  Service: Orthopedics;  Laterality: Right;    Social History   Socioeconomic History   Marital status: Married    Spouse name: Onalee Hua   Number of children: 1   Years of education: Not on file   Highest education level: Not on file  Occupational History   Not on file  Tobacco Use   Smoking status: Former  Current packs/day: 0.00    Average packs/day: 0.1 packs/day for 35.0 years (3.5 ttl pk-yrs)    Types: Cigarettes    Start date: 01/07/1975    Quit date: 01/07/2010    Years since quitting: 14.1   Smokeless tobacco: Never  Vaping Use   Vaping status: Never Used  Substance and Sexual Activity   Alcohol use: No   Drug use: No   Sexual activity: Not on file  Other Topics Concern    Not on file  Social History Narrative   Not on file   Social Drivers of Health   Financial Resource Strain: Not on file  Food Insecurity: Not on file  Transportation Needs: Not on file  Physical Activity: Not on file  Stress: Not on file  Social Connections: Not on file  Intimate Partner Violence: Not on file    Family History  Problem Relation Age of Onset   Hyperlipidemia Father    Hypertension Father    Diabetes Father    Breast cancer Neg Hx     Allergies  Allergen Reactions   Other Itching and Rash    Cat Dander   Fish Allergy Rash   Penicillins Itching and Rash    TOLERATED CEFAZOLIN   Shellfish Allergy Rash    Outpatient Medications Prior to Visit  Medication Sig   acetaminophen (TYLENOL) 500 MG tablet Take 2 tablets (1,000 mg total) by mouth every 6 (six) hours.   Cholecalciferol (VITAMIN D3) 125 MCG (5000 UT) CAPS Take 1 capsule (5,000 Units total) by mouth daily.   cyclobenzaprine (FLEXERIL) 10 MG tablet Take 1 tablet (10 mg total) by mouth 3 (three) times daily.   ferrous sulfate 325 (65 FE) MG tablet Take 1 tablet (325 mg total) by mouth daily.   gabapentin (NEURONTIN) 300 MG capsule Take 300 mg by mouth 3 (three) times daily.   phentermine (ADIPEX-P) 37.5 MG tablet TAKE 1 TABLET(37.5 MG) BY MOUTH DAILY BEFORE BREAKFAST   rosuvastatin (CRESTOR) 20 MG tablet Take 1 tablet (20 mg total) by mouth daily.   Ubrogepant (UBRELVY) 100 MG TABS Take 1 tablet (100 mg total) by mouth as needed.   [DISCONTINUED] levocetirizine (XYZAL) 5 MG tablet Take 1 tablet (5 mg total) by mouth every evening.   No facility-administered medications prior to visit.    Review of Systems  Constitutional: Negative.   HENT: Negative.    Eyes: Negative.   Respiratory: Negative.  Negative for shortness of breath.   Cardiovascular: Negative.  Negative for chest pain.  Gastrointestinal: Negative.  Negative for abdominal pain, constipation and diarrhea.  Genitourinary: Negative.    Musculoskeletal:  Negative for joint pain and myalgias.  Skin: Negative.   Neurological: Negative.  Negative for dizziness and headaches.  Endo/Heme/Allergies: Negative.   All other systems reviewed and are negative.      Objective:   BP 112/70   Pulse 95   Ht 5\' 6"  (1.676 m)   Wt 298 lb (135.2 kg)   SpO2 96%   BMI 48.10 kg/m   Vitals:   03/09/24 1048  BP: 112/70  Pulse: 95  Height: 5\' 6"  (1.676 m)  Weight: 298 lb (135.2 kg)  SpO2: 96%  BMI (Calculated): 48.12    Physical Exam Vitals and nursing note reviewed.  Constitutional:      Appearance: Normal appearance. She is normal weight.  HENT:     Head: Normocephalic and atraumatic.     Nose: Nose normal.     Mouth/Throat:  Mouth: Mucous membranes are moist.  Eyes:     Extraocular Movements: Extraocular movements intact.     Conjunctiva/sclera: Conjunctivae normal.     Pupils: Pupils are equal, round, and reactive to light.  Cardiovascular:     Rate and Rhythm: Normal rate and regular rhythm.     Pulses: Normal pulses.     Heart sounds: Normal heart sounds.  Pulmonary:     Effort: Pulmonary effort is normal.     Breath sounds: Normal breath sounds.  Abdominal:     General: Abdomen is flat. Bowel sounds are normal.     Palpations: Abdomen is soft.  Musculoskeletal:        General: Normal range of motion.     Cervical back: Normal range of motion.  Skin:    General: Skin is warm and dry.  Neurological:     General: No focal deficit present.     Mental Status: She is alert and oriented to person, place, and time.  Psychiatric:        Mood and Affect: Mood normal.        Behavior: Behavior normal.        Thought Content: Thought content normal.        Judgment: Judgment normal.      No results found for any visits on 03/09/24.  Recent Results (from the past 2160 hours)  POCT XPERT XPRESS SARS COVID-2/FLU/RSV [[id number]]     Status: None   Collection Time: 02/11/24 10:35 AM  Result Value Ref  Range   SARS Coronavirus 2 NEGATIVE    FLU A NEGATIVE    FLU B NEGATIVE    RSV RNA, PCR NEGATIVE       Assessment & Plan:  Return for fasting lab work. [facility] sent in. Xyzal and Flonase for allergies.   Problem List Items Addressed This Visit       Other   Body mass index (BMI) 45.0-49.9, adult (HCC)   Relevant Medications   Semaglutide-Weight Management (WEGOVY) 0.25 MG/0.5ML SOAJ   Mixed hyperlipidemia   Prediabetes   Weight loss counseling, encounter for - Primary    Return in about 4 weeks (around 04/06/2024) for with fasting lab work prior.   Total time spent: 25 minutes  Google, NP  03/09/2024   This document may have been prepared by Dragon Voice Recognition software and as such may include unintentional dictation errors.

## 2024-03-18 ENCOUNTER — Other Ambulatory Visit: Payer: Self-pay | Admitting: Orthopedic Surgery

## 2024-03-18 DIAGNOSIS — T8484XA Pain due to internal orthopedic prosthetic devices, implants and grafts, initial encounter: Secondary | ICD-10-CM

## 2024-03-24 ENCOUNTER — Ambulatory Visit
Admission: RE | Admit: 2024-03-24 | Discharge: 2024-03-24 | Disposition: A | Discharge status: Home / Self Care | Source: Ambulatory Visit | Attending: Orthopedic Surgery | Admitting: Orthopedic Surgery

## 2024-03-24 DIAGNOSIS — T8484XA Pain due to internal orthopedic prosthetic devices, implants and grafts, initial encounter: Secondary | ICD-10-CM | POA: Diagnosis present

## 2024-03-24 DIAGNOSIS — Z96651 Presence of right artificial knee joint: Secondary | ICD-10-CM | POA: Diagnosis present

## 2024-03-24 MED ORDER — TECHNETIUM TC 99M MEDRONATE IV KIT
20.0000 | PACK | Freq: Once | INTRAVENOUS | Status: AC | PRN
  Administered 2024-03-24: 21.96 via INTRAVENOUS

## 2024-04-06 ENCOUNTER — Other Ambulatory Visit

## 2024-04-06 DIAGNOSIS — Z6841 Body Mass Index (BMI) 40.0 and over, adult: Secondary | ICD-10-CM

## 2024-04-06 DIAGNOSIS — I10 Essential (primary) hypertension: Secondary | ICD-10-CM

## 2024-04-06 DIAGNOSIS — E782 Mixed hyperlipidemia: Secondary | ICD-10-CM

## 2024-04-06 DIAGNOSIS — R7303 Prediabetes: Secondary | ICD-10-CM

## 2024-04-07 LAB — LIPID PANEL
Chol/HDL Ratio: 2 ratio (ref 0.0–4.4)
Cholesterol, Total: 128 mg/dL (ref 100–199)
HDL: 63 mg/dL (ref >39)
LDL Chol Calc (NIH): 53 mg/dL (ref 0–99)
Triglycerides: 53 mg/dL (ref 0–149)
VLDL Cholesterol Cal: 12 mg/dL (ref 5–40)

## 2024-04-07 LAB — CMP14+EGFR
ALT: 13 IU/L (ref 0–32)
AST: 13 IU/L (ref 0–40)
Albumin: 3.8 g/dL (ref 3.8–4.9)
Alkaline Phosphatase: 114 IU/L (ref 44–121)
BUN/Creatinine Ratio: 19 (ref 12–28)
BUN: 16 mg/dL (ref 8–27)
Bilirubin Total: 0.3 mg/dL (ref 0.0–1.2)
CO2: 22 mmol/L (ref 20–29)
Calcium: 9.2 mg/dL (ref 8.7–10.3)
Chloride: 109 mmol/L — ABNORMAL HIGH (ref 96–106)
Creatinine, Ser: 0.84 mg/dL (ref 0.57–1.00)
Globulin, Total: 2 g/dL (ref 1.5–4.5)
Glucose: 86 mg/dL (ref 70–99)
Potassium: 4.3 mmol/L (ref 3.5–5.2)
Sodium: 145 mmol/L — ABNORMAL HIGH (ref 134–144)
Total Protein: 5.8 g/dL — ABNORMAL LOW (ref 6.0–8.5)
eGFR: 80 mL/min/{1.73_m2} (ref >59)

## 2024-04-07 LAB — HEMOGLOBIN A1C
Est. average glucose Bld gHb Est-mCnc: 131 mg/dL
Hgb A1c MFr Bld: 6.2 % — ABNORMAL HIGH (ref 4.8–5.6)

## 2024-04-07 LAB — TSH: TSH: 1.24 u[IU]/mL (ref 0.450–4.500)

## 2024-04-09 ENCOUNTER — Ambulatory Visit: Admitting: Cardiology

## 2024-04-09 ENCOUNTER — Encounter: Payer: Self-pay | Admitting: Cardiology

## 2024-04-09 VITALS — BP 128/72 | HR 93 | Ht 66.0 in | Wt 298.0 lb

## 2024-04-09 DIAGNOSIS — E782 Mixed hyperlipidemia: Secondary | ICD-10-CM

## 2024-04-09 DIAGNOSIS — E559 Vitamin D deficiency, unspecified: Secondary | ICD-10-CM

## 2024-04-09 DIAGNOSIS — R7303 Prediabetes: Secondary | ICD-10-CM

## 2024-04-09 DIAGNOSIS — Z0130 Encounter for examination of blood pressure without abnormal findings: Secondary | ICD-10-CM

## 2024-04-09 NOTE — Progress Notes (Signed)
 Established Patient Office Visit  Subjective:  Patient ID: Monique Patton, female    DOB: 02-Dec-1965  Age: 59 y.o. MRN: [id number]  Chief Complaint  Patient presents with   Follow-up    1 Month Follow Up    Patient in office for 1 month follow up, discuss recent lab results. Patient doing well, no complaints today. Has not started [place] due to insurance issues.  Discussed recent lab work. HgbA 1c increasing, work on diet and exercise to lower A1c. LDL improved. Normal kidney function.     No other concerns at this time.   Past Medical History:  Diagnosis Date   Allergy    Anemia    Arthritis    DDD (degenerative disc disease), cervical    Eczema    Full thickness rotator cuff tear    H/O gastric bypass    Heart murmur    since birth   HLD (hyperlipidemia)    Impingement syndrome of shoulder region, left    Persistent headaches    Pre-diabetes    Sleep apnea    had surgery Jenna Luo   Vitamin D deficiency     Past Surgical History:  Procedure Laterality Date   ABDOMINAL HYSTERECTOMY     ADENIDECTOMY     ANTERIOR CERVICAL DECOMP/DISCECTOMY FUSION  03/13/2012   Procedure: ANTERIOR CERVICAL DECOMPRESSION/DISCECTOMY FUSION 2 LEVELS;  Surgeon: Hewitt Shorts, MD;  Location: [facility];  Service: Neurosurgery;  Laterality: N/A;  Cervical Five-Six Cervical Six-Seven Anterior Cervical Decompression with fusion plating and bonegraft   COLONOSCOPY WITH PROPOFOL N/A 01/07/2018   Procedure: COLONOSCOPY WITH PROPOFOL;  Surgeon: Toledo, Boykin Nearing, MD;  Location: [facility];  Service: Gastroenterology;  Laterality: N/A;   GASTRIC BYPASS  2003   LUMBAR LAMINECTOMY/DECOMPRESSION MICRODISCECTOMY Left 05/14/2013   Procedure: LUMBAR LAMINECTOMY/DECOMPRESSION MICRODISCECTOMY 1 LEVEL;  Surgeon: Hewitt Shorts, MD;  Location: [facility];  Service: Neurosurgery;  Laterality: Left;  LEFT Lumbar four-five4 extraforaminal microdiskectomy   POSTERIOR CERVICAL FUSION/FORAMINOTOMY  N/A 11/05/2013   Procedure: Cervical five-six posterior arthrodesis and instrumentation;  Surgeon: Hewitt Shorts, MD;  Location: [facility];  Service: Neurosurgery;  Laterality: N/A;  Cervical five-six posterior arthrodesis and instrumentation   SHOULDER ARTHROSCOPY WITH SUBACROMIAL DECOMPRESSION, ROTATOR CUFF REPAIR AND BICEP TENDON REPAIR Left 08/22/2023   Procedure: LEFT SHOULDER ARTHROSCOPY WITH DEBRIDEMENT, DECOMPRESSION, DISTAL CLAVICLE EXCISION, BICEPS TENODESIS, AND MINI OPEN ROTATOR CUFF REPAIR.;  Surgeon: Christena Flake, MD;  Location: [facility];  Service: Orthopedics;  Laterality: Left;   TONSILLECTOMY     TOTAL KNEE ARTHROPLASTY Left 11/09/2021   Procedure: TOTAL KNEE ARTHROPLASTY;  Surgeon: Kennedy Bucker, MD;  Location: [facility];  Service: Orthopedics;  Laterality: Left;   TOTAL KNEE ARTHROPLASTY Right 05/29/2022   Procedure: TOTAL KNEE ARTHROPLASTY;  Surgeon: Kennedy Bucker, MD;  Location: [facility];  Service: Orthopedics;  Laterality: Right;    Social History   Socioeconomic History   Marital status: Married    Spouse name: Onalee Hua   Number of children: 1   Years of education: Not on file   Highest education level: Not on file  Occupational History   Not on file  Tobacco Use   Smoking status: Former    Current packs/day: 0.00    Average packs/day: 0.1 packs/day for 35.0 years (3.5 ttl pk-yrs)    Types: Cigarettes    Start date: 01/07/1975    Quit date: 01/07/2010    Years since quitting: 14.2   Smokeless tobacco: Never  Vaping Use  Vaping status: Never Used  Substance and Sexual Activity   Alcohol use: No   Drug use: No   Sexual activity: Not on file  Other Topics Concern   Not on file  Social History Narrative   Not on file   Social Drivers of Health   Financial Resource Strain: Medium Risk (03/18/2024)   Received from [facility]   Overall Financial Resource Strain (CARDIA)    Difficulty of Paying Living Expenses: Somewhat hard  Food  Insecurity: Food Insecurity Present (03/18/2024)   [facility] from [facility]   Hunger Vital Sign    Worried About Running Out of Food in the Last Year: Sometimes true    Ran Out of Food in the Last Year: Sometimes true  Transportation Needs: No Transportation Needs (03/18/2024)   Received from [facility] - Transportation    In the past 12 months, has lack of transportation kept you from medical appointments or from getting medications?: No    Lack of Transportation (Non-Medical): No  Physical Activity: Not on file  Stress: Not on file  Social Connections: Not on file  Intimate Partner Violence: Not on file    Family History  Problem Relation Age of Onset   Hyperlipidemia Father    Hypertension Father    Diabetes Father    Breast cancer Neg Hx     Allergies  Allergen Reactions   Other Itching and Rash    Cat Dander   Fish Allergy Rash   Penicillins Itching and Rash    TOLERATED CEFAZOLIN   Shellfish Allergy Rash    Outpatient Medications Prior to Visit  Medication Sig   acetaminophen (TYLENOL) 500 MG tablet Take 2 tablets (1,000 mg total) by mouth every 6 (six) hours.   Cholecalciferol (VITAMIN D3) 125 MCG (5000 UT) CAPS Take 1 capsule (5,000 Units total) by mouth daily.   cyclobenzaprine (FLEXERIL) 10 MG tablet Take 1 tablet (10 mg total) by mouth 3 (three) times daily.   ferrous sulfate 325 (65 FE) MG tablet Take 1 tablet (325 mg total) by mouth daily.   fluticasone (FLONASE) 50 MCG/ACT nasal spray Place 1 spray into both nostrils daily.   gabapentin (NEURONTIN) 300 MG capsule Take 300 mg by mouth 3 (three) times daily.   levocetirizine (XYZAL) 5 MG tablet Take 1 tablet (5 mg total) by mouth every evening.   phentermine (ADIPEX-P) 37.5 MG tablet TAKE 1 TABLET(37.5 MG) BY MOUTH DAILY BEFORE BREAKFAST   rosuvastatin (CRESTOR) 20 MG tablet Take 1 tablet (20 mg total) by mouth daily.   Semaglutide-Weight Management (WEGOVY) 0.25  MG/0.5ML SOAJ Inject 0.25 mg into the skin once a week.   Ubrogepant (UBRELVY) 100 MG TABS Take 1 tablet (100 mg total) by mouth as needed.   No facility-administered medications prior to visit.    Review of Systems  Constitutional: Negative.   HENT: Negative.    Eyes: Negative.   Respiratory: Negative.  Negative for shortness of breath.   Cardiovascular: Negative.  Negative for chest pain.  Gastrointestinal: Negative.  Negative for abdominal pain, constipation and diarrhea.  Genitourinary: Negative.   Musculoskeletal:  Negative for joint pain and myalgias.  Skin: Negative.   Neurological: Negative.  Negative for dizziness and headaches.  Endo/Heme/Allergies: Negative.   All other systems reviewed and are negative.      Objective:   BP 128/72   Pulse 93   Ht 5\' 6"  (1.676 m)   Wt 298 lb (135.2  kg)   SpO2 94%   BMI 48.10 kg/m   Vitals:   04/09/24 0935  BP: 128/72  Pulse: 93  Height: 5\' 6"  (1.676 m)  Weight: 298 lb (135.2 kg)  SpO2: 94%  BMI (Calculated): 48.12    Physical Exam Vitals and nursing note reviewed.  Constitutional:      Appearance: Normal appearance. She is normal weight.  HENT:     Head: Normocephalic and atraumatic.     Nose: Nose normal.     Mouth/Throat:     Mouth: Mucous membranes are moist.  Eyes:     Extraocular Movements: Extraocular movements intact.     Conjunctiva/sclera: Conjunctivae normal.     Pupils: Pupils are equal, round, and reactive to light.  Cardiovascular:     Rate and Rhythm: Normal rate and regular rhythm.     Pulses: Normal pulses.     Heart sounds: Normal heart sounds.  Pulmonary:     Effort: Pulmonary effort is normal.     Breath sounds: Normal breath sounds.  Abdominal:     General: Abdomen is flat. Bowel sounds are normal.     Palpations: Abdomen is soft.  Musculoskeletal:        General: Normal range of motion.     Cervical back: Normal range of motion.  Skin:    General: Skin is warm and dry.   Neurological:     General: No focal deficit present.     Mental Status: She is alert and oriented to person, place, and time.  Psychiatric:        Mood and Affect: Mood normal.        Behavior: Behavior normal.        Thought Content: Thought content normal.        Judgment: Judgment normal.      No results found for any visits on 04/09/24.  Recent Results (from the past 2160 hours)  POCT XPERT XPRESS SARS COVID-2/FLU/RSV [[id number]]     Status: None   Collection Time: 02/11/24 10:35 AM  Result Value Ref Range   SARS Coronavirus 2 NEGATIVE    FLU A NEGATIVE    FLU B NEGATIVE    RSV RNA, PCR NEGATIVE   Hemoglobin A1c     Status: Abnormal   Collection Time: 04/06/24 10:49 AM  Result Value Ref Range   Hgb A1c MFr Bld 6.2 (H) 4.8 - 5.6 %    Comment:          Prediabetes: 5.7 - 6.4          Diabetes: >6.4          Glycemic control for adults with diabetes: <7.0    Est. average glucose Bld gHb Est-mCnc 131 mg/dL  TSH     Status: None   Collection Time: 04/06/24 10:49 AM  Result Value Ref Range   TSH 1.240 0.450 - 4.500 uIU/mL  CMP14+EGFR     Status: Abnormal   Collection Time: 04/06/24 10:49 AM  Result Value Ref Range   Glucose 86 70 - 99 mg/dL   BUN 16 8 - 27 mg/dL   Creatinine, Ser 8.29 0.57 - 1.00 mg/dL   eGFR 80 >56 OZ/HYQ/6.57   BUN/Creatinine Ratio 19 12 - 28   Sodium 145 (H) 134 - 144 mmol/L   Potassium 4.3 3.5 - 5.2 mmol/L   Chloride 109 (H) 96 - 106 mmol/L   CO2 22 20 - 29 mmol/L   Calcium 9.2 8.7 - 10.3 mg/dL  Total Protein 5.8 (L) 6.0 - 8.5 g/dL   Albumin 3.8 3.8 - 4.9 g/dL   Globulin, Total 2.0 1.5 - 4.5 g/dL   Bilirubin Total 0.3 0.0 - 1.2 mg/dL   Alkaline Phosphatase 114 44 - 121 IU/L   AST 13 0 - 40 IU/L   ALT 13 0 - 32 IU/L  Lipid panel     Status: None   Collection Time: 04/06/24 10:49 AM  Result Value Ref Range   Cholesterol, Total 128 100 - 199 mg/dL   Triglycerides 53 0 - 149 mg/dL   HDL 63 >86 mg/dL   VLDL Cholesterol Cal 12 5 - 40 mg/dL    LDL Chol Calc (NIH) 53 0 - 99 mg/dL   Chol/HDL Ratio 2.0 0.0 - 4.4 ratio    Comment:                                   T. Chol/HDL Ratio                                             Men  Women                               1/2 Avg.Risk  3.4    3.3                                   Avg.Risk  5.0    4.4                                2X Avg.Risk  9.6    7.1                                3X Avg.Risk 23.4   11.0       Assessment & Plan:  Return after starting [facility].  Work on diet and exercise to lower A1c.  Problem List Items Addressed This Visit       Other   Vitamin D deficiency   Mixed hyperlipidemia - Primary   Prediabetes    Return in about 3 months (around 07/09/2024).   Total time spent: 25 minutes  [employer], NP  04/09/2024   This document may have been prepared by Dragon Voice Recognition software and as such may include unintentional dictation errors.

## 2024-04-29 ENCOUNTER — Other Ambulatory Visit: Payer: Self-pay | Admitting: Family

## 2024-06-05 ENCOUNTER — Emergency Department
Admission: EM | Admit: 2024-06-05 | Discharge: 2024-06-05 | Disposition: A | Discharge status: Home / Self Care | Attending: Emergency Medicine | Admitting: Emergency Medicine

## 2024-06-05 ENCOUNTER — Other Ambulatory Visit: Payer: Self-pay

## 2024-06-05 ENCOUNTER — Encounter: Payer: Self-pay | Admitting: Emergency Medicine

## 2024-06-05 DIAGNOSIS — W57XXXA Bitten or stung by nonvenomous insect and other nonvenomous arthropods, initial encounter: Secondary | ICD-10-CM | POA: Diagnosis not present

## 2024-06-05 DIAGNOSIS — R519 Headache, unspecified: Secondary | ICD-10-CM | POA: Diagnosis not present

## 2024-06-05 DIAGNOSIS — S30861A Insect bite (nonvenomous) of abdominal wall, initial encounter: Secondary | ICD-10-CM | POA: Diagnosis present

## 2024-06-05 MED ORDER — DOXYCYCLINE HYCLATE 100 MG PO TABS: 100.0000 mg | ORAL_TABLET | Freq: Two times a day (BID) | ORAL | 0 refills | Status: AC

## 2024-06-05 NOTE — Discharge Instructions (Addendum)
 You have been diagnosed with tick bite.  Please take doxycycline , 1 tablet by mouth every 12 hours after main meals for 10 days.  Please come back to ED or go to your PCP if you have new symptoms or symptoms worsen

## 2024-06-05 NOTE — ED Provider Notes (Signed)
 [facility] Provider Note    Event Date/Time   First MD Initiated Contact with Patient 06/05/24 1924     (approximate)   History   Insect Bite (Tick removed to lower abdomen)    HPI  Monique Patton is a 59 y.o. female    with a past medical history of lumbar radiculitis, spondylosis, hyperlipidemia, prediabetes, who presents to the ED complaining of insect bite. According to the patient, 2 days ago she had an area in her abdomen that was itchy.  Today patient found it was a tick.  Patient brought the tick, check media for picture.  Patient states having headaches, and feeling tired.  Patient denies fever.      ROS: Patient currently denies any vision changes, tinnitus, difficulty speaking, facial droop, sore throat, chest pain, shortness of breath, abdominal pain, nausea/vomiting/diarrhea, dysuria, or weakness/numbness/paresthesias in any extremity   Physical Exam   Triage Vital Signs: ED Triage Vitals  Encounter Vitals Group     BP 06/05/24 1909 122/60     Systolic BP Percentile --      Diastolic BP Percentile --      Pulse Rate 06/05/24 1909 96     Resp 06/05/24 1909 17     Temp 06/05/24 1909 99.3 F (37.4 C)     Temp Source 06/05/24 1909 Oral     SpO2 06/05/24 1909 100 %     Weight 06/05/24 1908 296 lb (134.3 kg)     Height 06/05/24 1908 5\' 6"  (1.676 m)     Head Circumference --      Peak Flow --      Pain Score 06/05/24 1908 5     Pain Loc --      Pain Education --      Exclude from Growth Chart --     Most recent vital signs: Vitals:   06/05/24 1909  BP: 122/60  Pulse: 96  Resp: 17  Temp: 99.3 F (37.4 C)  SpO2: 100%     Constitutional: Alert, NAD. Able to speak in complete sentences without cough or dyspnea  Eyes: Conjunctivae are normal.  Head: Atraumatic. Nose: No congestion/rhinnorhea. Mouth/Throat: Mucous membranes are moist.   Neck: Painless ROM. Supple. No JVD, nodes, thyromegaly  Cardiovascular:   Good peripheral  circulation.RRR no murmurs, gallops, rubs  Respiratory: Normal respiratory effort.  No retractions. Clear to auscultation bilaterally without wheezing or crackles  Gastrointestinal: Soft and nontender.  Musculoskeletal:  no deformity Neurologic:  MAE spontaneously. No gross focal neurologic deficits are appreciated.  Skin: Abdomen: Presence of a circular lesion in the central lower abdomen.  Check media for picture Skin is warm, dry and intact. No rash noted. Psychiatric: Mood and affect are normal. Speech and behavior are normal.    ED Results / Procedures / Treatments   Labs (all labs ordered are listed, but only abnormal results are displayed) Labs Reviewed - No data to display   EKG     RADIOLOGY     PROCEDURES:  Critical Care performed:   Procedures   MEDICATIONS ORDERED IN ED: Medications - No data to display    IMPRESSION / MDM / ASSESSMENT AND PLAN / ED COURSE  I reviewed the triage vital signs and the nursing notes.  Differential diagnosis includes, but is not limited to, tick bite, foreign object, cellulitis  Patient's presentation is most consistent with acute, uncomplicated illness.   Deionna Marcantonio Foustis a 59 y.o., female resents today with history of 2  days of itchiness in her lower abdomen, today patient found a tick in the area.  Patient denies fever, patient states having headache and feeling tired.  Physical exam is reassuring,  I did clean the area. Patient's diagnosis is consistent with tick  bite. I did review the patient's allergies and medications.The patient is in stable and satisfactory condition for discharge home  Patient will be discharged home with prescriptions for doxycycline . Patient is to follow up with PCP as needed or otherwise directed. Patient is given ED precautions to return to the ED for any worsening or new symptoms. Discussed plan of care with patient, answered all of patient's questions, Patient agreeable to plan of care. Advised  patient to take medications according to the instructions on the label. Discussed possible side effects of new medications. Patient verbalized understanding.  FINAL CLINICAL IMPRESSION(S) / ED DIAGNOSES   Final diagnoses:  Tick bite of abdominal wall, initial encounter     Rx / DC Orders   ED Discharge Orders          Ordered    doxycycline  (VIBRA -TABS) 100 MG tablet  2 times daily        06/05/24 1953             Note:  This document was prepared using Dragon voice recognition software and may include unintentional dictation errors.   Awilda Lennox, PA-C 06/05/24 1953    Lind Repine, MD 06/05/24 [phone]

## 2024-06-05 NOTE — ED Triage Notes (Signed)
 TO ER from home with complaint of pulling a tick off of lower abdomen, unknown amount of time it was attached to abdomen. Has tick with her, tick appears was latched on. Patient not with an open spot where tick was. Slight hardness where tick was.  Also complaining of slight migraine due to increased stress since finding tick with no relief from migraine home medication.

## 2024-06-16 ENCOUNTER — Other Ambulatory Visit: Payer: Self-pay

## 2024-06-23 ENCOUNTER — Telehealth: Payer: Self-pay

## 2024-06-23 NOTE — Telephone Encounter (Signed)
 Patient had a work accident that is requiring her to see a neurosurgeon which resulted in  a workers comp claim she was calling asking for something for pain since she's having issues getting in contact with her rep for the workers comp claim, she was informed that if we sent her in something or if she seen us  for that it could result in the denying of her WC claim. Pt understood and would reach out to her WC rep again

## 2024-06-23 NOTE — Telephone Encounter (Signed)
Pt LM asking for call back.

## 2024-07-16 ENCOUNTER — Ambulatory Visit: Admitting: Cardiology

## 2024-07-24 ENCOUNTER — Other Ambulatory Visit: Payer: Self-pay | Admitting: Cardiology

## 2024-08-18 ENCOUNTER — Encounter: Payer: Self-pay | Admitting: Cardiology

## 2024-08-18 ENCOUNTER — Ambulatory Visit: Admitting: Cardiology

## 2024-08-18 VITALS — BP 142/82 | HR 85 | Ht 67.0 in | Wt 304.6 lb

## 2024-08-18 DIAGNOSIS — R7303 Prediabetes: Secondary | ICD-10-CM

## 2024-08-18 DIAGNOSIS — E782 Mixed hyperlipidemia: Secondary | ICD-10-CM | POA: Diagnosis not present

## 2024-08-18 DIAGNOSIS — Z1231 Encounter for screening mammogram for malignant neoplasm of breast: Secondary | ICD-10-CM

## 2024-08-18 DIAGNOSIS — Z1329 Encounter for screening for other suspected endocrine disorder: Secondary | ICD-10-CM

## 2024-08-18 DIAGNOSIS — Z6841 Body Mass Index (BMI) 40.0 and over, adult: Secondary | ICD-10-CM | POA: Diagnosis not present

## 2024-08-18 DIAGNOSIS — Z0130 Encounter for examination of blood pressure without abnormal findings: Secondary | ICD-10-CM

## 2024-08-18 DIAGNOSIS — L729 Follicular cyst of the skin and subcutaneous tissue, unspecified: Secondary | ICD-10-CM | POA: Diagnosis not present

## 2024-08-18 MED ORDER — CEPHALEXIN 500 MG PO CAPS: 500.0000 mg | ORAL_CAPSULE | Freq: Two times a day (BID) | ORAL | 0 refills | Status: DC

## 2024-08-18 MED ORDER — METHYLPREDNISOLONE 4 MG PO TBPK: ORAL_TABLET | ORAL | 0 refills | Status: DC

## 2024-08-18 MED ORDER — METFORMIN HCL 500 MG PO TABS: 500.0000 mg | ORAL_TABLET | Freq: Two times a day (BID) | ORAL | 1 refills | Status: DC

## 2024-08-18 NOTE — Progress Notes (Signed)
 Established Patient Office Visit  Subjective:  Patient ID: Monique Patton, female    DOB: 05-Feb-1965  Age: 59 y.o. MRN: [id number]  Chief Complaint  Patient presents with   Follow-up    3 month follow up    Patient in office for 3 month follow up. Patient reports being bit by fire ants on ankles and legs, Has been using benadryl  cream with minimal relief. Will send in a medrol  dose pack and cephalexin .  Patient complaining of a cyst in he pubic region, thought it was an ingrown hair. Has been there for about eight years. Currently has drainage and bleeding. Will send to general surgery for removal of cyst. Fasting today, will get blood work.   Due for mammogram, will send order. Patient having problems losing weight, insurance will not cover GLP-1. Will send in metformin .     No other concerns at this time.   Past Medical History:  Diagnosis Date   Allergy    Anemia    Arthritis    DDD (degenerative disc disease), cervical    Eczema    Full thickness rotator cuff tear    H/O gastric bypass    Heart murmur    since birth   HLD (hyperlipidemia)    Impingement syndrome of shoulder region, left    Persistent headaches    Pre-diabetes    Sleep apnea    had surgery /resolved   Vitamin D  deficiency     Past Surgical History:  Procedure Laterality Date   ABDOMINAL HYSTERECTOMY     ADENIDECTOMY     ANTERIOR CERVICAL DECOMP/DISCECTOMY FUSION  03/13/2012   Procedure: ANTERIOR CERVICAL DECOMPRESSION/DISCECTOMY FUSION 2 LEVELS;  Surgeon: Lamar LELON Peaches, MD;  Location: [facility];  Service: Neurosurgery;  Laterality: N/A;  Cervical Five-Six Cervical Six-Seven Anterior Cervical Decompression with fusion plating and bonegraft   COLONOSCOPY WITH PROPOFOL  N/A 01/07/2018   Procedure: COLONOSCOPY WITH PROPOFOL ;  Surgeon: Toledo, Ladell POUR, MD;  Location: [facility];  Service: Gastroenterology;  Laterality: N/A;   GASTRIC BYPASS  2003   LUMBAR LAMINECTOMY/DECOMPRESSION  MICRODISCECTOMY Left 05/14/2013   Procedure: LUMBAR LAMINECTOMY/DECOMPRESSION MICRODISCECTOMY 1 LEVEL;  Surgeon: Lamar LELON Peaches, MD;  Location: [facility];  Service: Neurosurgery;  Laterality: Left;  LEFT Lumbar four-five4 extraforaminal microdiskectomy   POSTERIOR CERVICAL FUSION/FORAMINOTOMY N/A 11/05/2013   Procedure: Cervical five-six posterior arthrodesis and instrumentation;  Surgeon: Lamar LELON Peaches, MD;  Location: [facility];  Service: Neurosurgery;  Laterality: N/A;  Cervical five-six posterior arthrodesis and instrumentation   SHOULDER ARTHROSCOPY WITH SUBACROMIAL DECOMPRESSION, ROTATOR CUFF REPAIR AND BICEP TENDON REPAIR Left 08/22/2023   Procedure: LEFT SHOULDER ARTHROSCOPY WITH DEBRIDEMENT, DECOMPRESSION, DISTAL CLAVICLE EXCISION, BICEPS TENODESIS, AND MINI OPEN ROTATOR CUFF REPAIR.;  Surgeon: Edie Norleen PARAS, MD;  Location: [facility];  Service: Orthopedics;  Laterality: Left;   TONSILLECTOMY     TOTAL KNEE ARTHROPLASTY Left 11/09/2021   Procedure: TOTAL KNEE ARTHROPLASTY;  Surgeon: Kathlynn Sharper, MD;  Location: [facility];  Service: Orthopedics;  Laterality: Left;   TOTAL KNEE ARTHROPLASTY Right 05/29/2022   Procedure: TOTAL KNEE ARTHROPLASTY;  Surgeon: Kathlynn Sharper, MD;  Location: [facility];  Service: Orthopedics;  Laterality: Right;    Social History   Socioeconomic History   Marital status: Married    Spouse name: Alm   Number of children: 1   Years of education: Not on file   Highest education level: Not on file  Occupational History   Not on file  Tobacco Use  Smoking status: Former    Current packs/day: 0.00    Average packs/day: 0.1 packs/day for 35.0 years (3.5 ttl pk-yrs)    Types: Cigarettes    Start date: 01/07/1975    Quit date: 01/07/2010    Years since quitting: 14.6   Smokeless tobacco: Never  Vaping Use   Vaping status: Never Used  Substance and Sexual Activity   Alcohol use: No   Drug use: No   Sexual activity: Not on file  Other Topics Concern    Not on file  Social History Narrative   Not on file   Social Drivers of Health   Financial Resource Strain: Medium Risk (03/18/2024)   Received from [facility]   Overall Financial Resource Strain (CARDIA)    Difficulty of Paying Living Expenses: Somewhat hard  Food Insecurity: Food Insecurity Present (03/18/2024)   Received from [facility]   Hunger Vital Sign    Within the past 12 months, you worried that your food would run out before you got the money to buy more.: Sometimes true    Within the past 12 months, the food you bought just didn't last and you didn't have money to get more.: Sometimes true  Transportation Needs: No Transportation Needs (03/18/2024)   Received from [facility] - Transportation    In the past 12 months, has lack of transportation kept you from medical appointments or from getting medications?: No    Lack of Transportation (Non-Medical): No  Physical Activity: Not on file  Stress: Not on file  Social Connections: Not on file  Intimate Partner Violence: Not on file    Family History  Problem Relation Age of Onset   Hyperlipidemia Father    Hypertension Father    Diabetes Father    Breast cancer Neg Hx     Allergies  Allergen Reactions   Other Itching and Rash    Cat Dander   Fish Allergy Rash   Penicillins Itching and Rash    TOLERATED CEFAZOLIN    Shellfish Allergy Rash    Outpatient Medications Prior to Visit  Medication Sig   Cholecalciferol  (VITAMIN D3) 125 MCG (5000 UT) CAPS Take 1 capsule (5,000 Units total) by mouth daily.   cyclobenzaprine  (FLEXERIL ) 10 MG tablet TAKE 1 TABLET(10 MG) BY MOUTH THREE TIMES DAILY   gabapentin  (NEURONTIN ) 300 MG capsule Take 300 mg by mouth 3 (three) times daily.   rosuvastatin  (CRESTOR ) 20 MG tablet Take 1 tablet (20 mg total) by mouth daily.   Ubrogepant  (UBRELVY ) 100 MG TABS Take 1 tablet (100 mg total) by mouth as needed.   [DISCONTINUED]  acetaminophen  (TYLENOL ) 500 MG tablet Take 2 tablets (1,000 mg total) by mouth every 6 (six) hours. (Patient not taking: Reported on 08/18/2024)   [DISCONTINUED] ferrous sulfate  325 (65 FE) MG tablet Take 1 tablet (325 mg total) by mouth daily. (Patient not taking: Reported on 08/18/2024)   [DISCONTINUED] fluticasone  (FLONASE ) 50 MCG/ACT nasal spray Place 1 spray into both nostrils daily. (Patient not taking: Reported on 08/18/2024)   [DISCONTINUED] levocetirizine (XYZAL ) 5 MG tablet Take 1 tablet (5 mg total) by mouth every evening. (Patient not taking: Reported on 08/18/2024)   [DISCONTINUED] phentermine  (ADIPEX-P ) 37.5 MG tablet TAKE 1 TABLET(37.5 MG) BY MOUTH DAILY BEFORE BREAKFAST (Patient not taking: Reported on 08/18/2024)   [DISCONTINUED] Semaglutide -Weight Management (WEGOVY ) 0.25 MG/0.5ML SOAJ Inject 0.25 mg into the skin once a week.   No facility-administered medications prior to visit.  Review of Systems  Constitutional: Negative.   HENT: Negative.    Eyes: Negative.   Respiratory: Negative.  Negative for shortness of breath.   Cardiovascular: Negative.  Negative for chest pain.  Gastrointestinal: Negative.  Negative for abdominal pain, constipation and diarrhea.  Genitourinary: Negative.   Musculoskeletal:  Positive for back pain. Negative for joint pain and myalgias.  Skin: Negative.   Neurological: Negative.  Negative for dizziness and headaches.  Endo/Heme/Allergies: Negative.   Psychiatric/Behavioral:  The patient has insomnia.   All other systems reviewed and are negative.      Objective:   BP (!) 142/82   Pulse 85   Ht 5' 7 (1.702 m)   Wt (!) 304 lb 9.6 oz (138.2 kg)   SpO2 96%   BMI 47.71 kg/m   Vitals:   08/18/24 0959  BP: (!) 142/82  Pulse: 85  Height: 5' 7 (1.702 m)  Weight: (!) 304 lb 9.6 oz (138.2 kg)  SpO2: 96%  BMI (Calculated): 47.7    Physical Exam Vitals and nursing note reviewed.  Constitutional:      Appearance: Normal appearance. She  is normal weight.  HENT:     Head: Normocephalic and atraumatic.     Nose: Nose normal.     Mouth/Throat:     Mouth: Mucous membranes are moist.  Eyes:     Extraocular Movements: Extraocular movements intact.     Conjunctiva/sclera: Conjunctivae normal.     Pupils: Pupils are equal, round, and reactive to light.  Cardiovascular:     Rate and Rhythm: Normal rate and regular rhythm.     Pulses: Normal pulses.     Heart sounds: Normal heart sounds.  Pulmonary:     Effort: Pulmonary effort is normal.     Breath sounds: Normal breath sounds.  Abdominal:     General: Abdomen is flat. Bowel sounds are normal.     Palpations: Abdomen is soft.  Musculoskeletal:        General: Normal range of motion.     Cervical back: Normal range of motion.  Skin:    General: Skin is warm and dry.  Neurological:     General: No focal deficit present.     Mental Status: She is alert and oriented to person, place, and time.  Psychiatric:        Mood and Affect: Mood normal.        Behavior: Behavior normal.        Thought Content: Thought content normal.        Judgment: Judgment normal.      No results found for any visits on 08/18/24.  No results found for this or any previous visit (from the past 2160 hours).    Assessment & Plan:  Medrol  dose pack Cephalexin  Referral sent to general surgery Fasting lab work today Mammogram order sent Metformin  for weight loss  Problem List Items Addressed This Visit       Other   Body mass index (BMI) 45.0-49.9, adult (HCC)   Relevant Medications   metFORMIN  (GLUCOPHAGE ) 500 MG tablet   Mixed hyperlipidemia   Relevant Orders   Lipid Profile   Prediabetes - Primary   Relevant Orders   CMP14+EGFR   Hemoglobin A1c   Other Visit Diagnoses       Thyroid disorder screening       Relevant Orders   TSH     Breast cancer screening by mammogram       Relevant Orders  MM 3D SCREENING MAMMOGRAM BILATERAL BREAST     Cyst of skin       Relevant  Orders   Ambulatory referral to General Surgery       Return in about 4 weeks (around 09/15/2024).   Total time spent: 25 minutes  [employer], NP  08/18/2024   This document may have been prepared by Dragon Voice Recognition software and as such may include unintentional dictation errors.

## 2024-08-19 ENCOUNTER — Ambulatory Visit: Payer: Self-pay | Admitting: Cardiology

## 2024-08-19 LAB — CMP14+EGFR
ALT: 11 IU/L (ref 0–32)
AST: 15 IU/L (ref 0–40)
Albumin: 4 g/dL (ref 3.8–4.9)
Alkaline Phosphatase: 129 IU/L — ABNORMAL HIGH (ref 44–121)
BUN/Creatinine Ratio: 17 (ref 12–28)
BUN: 15 mg/dL (ref 8–27)
Bilirubin Total: 0.3 mg/dL (ref 0.0–1.2)
CO2: 24 mmol/L (ref 20–29)
Calcium: 9.4 mg/dL (ref 8.7–10.3)
Chloride: 107 mmol/L — ABNORMAL HIGH (ref 96–106)
Creatinine, Ser: 0.86 mg/dL (ref 0.57–1.00)
Globulin, Total: 2.3 g/dL (ref 1.5–4.5)
Glucose: 90 mg/dL (ref 70–99)
Potassium: 4.3 mmol/L (ref 3.5–5.2)
Sodium: 145 mmol/L — ABNORMAL HIGH (ref 134–144)
Total Protein: 6.3 g/dL (ref 6.0–8.5)
eGFR: 77 mL/min/1.73 (ref >59)

## 2024-08-19 LAB — TSH: TSH: 1.55 u[IU]/mL (ref 0.450–4.500)

## 2024-08-19 LAB — HEMOGLOBIN A1C
Est. average glucose Bld gHb Est-mCnc: 128 mg/dL
Hgb A1c MFr Bld: 6.1 % — ABNORMAL HIGH (ref 4.8–5.6)

## 2024-08-19 LAB — LIPID PANEL
Chol/HDL Ratio: 2.7 ratio (ref 0.0–4.4)
Cholesterol, Total: 190 mg/dL (ref 100–199)
HDL: 71 mg/dL (ref >39)
LDL Chol Calc (NIH): 109 mg/dL — ABNORMAL HIGH (ref 0–99)
Triglycerides: 50 mg/dL (ref 0–149)
VLDL Cholesterol Cal: 10 mg/dL (ref 5–40)

## 2024-08-20 ENCOUNTER — Other Ambulatory Visit: Payer: Self-pay | Admitting: Cardiology

## 2024-08-24 ENCOUNTER — Encounter: Payer: Self-pay | Admitting: Surgery

## 2024-08-24 ENCOUNTER — Ambulatory Visit: Admitting: Surgery

## 2024-08-24 VITALS — BP 139/89 | HR 84 | Temp 98.2°F | Ht 66.0 in | Wt 296.4 lb

## 2024-08-24 DIAGNOSIS — L720 Epidermal cyst: Secondary | ICD-10-CM | POA: Diagnosis not present

## 2024-08-24 MED ORDER — CEPHALEXIN 500 MG PO CAPS: 500.0000 mg | ORAL_CAPSULE | Freq: Two times a day (BID) | ORAL | 0 refills | Status: AC

## 2024-08-24 NOTE — Progress Notes (Signed)
 08/24/2024  Reason for Visit:  Infected cyst of skin  Requesting Provider:  Jeoffrey Pollen, NP  History of Present Illness: Monique Patton is a 59 y.o. female presenting for evaluation of a cyst of the skin in the suprapubic region.  The patient reports having this cyst or hair bump for about 13 years, and it had perhaps grown a bit over the years, but about two weeks ago, it grew more acutely and became more tender and started spontaneously draining.  It has continued to drain since.  She has a dressing that she changes daily when she showers.  She has not tried squeezing the cyst.  She reports nausea a couple of times due to the nervousness of the cyst and the drainage.  Could have also had fever at times, but she's unsure.  She was seen by her PCP on 08/18/24 and was started on a 5 day course of Keflex .  This has helped to some degree.  Past Medical History: Past Medical History:  Diagnosis Date   Allergy    Anemia    Arthritis    DDD (degenerative disc disease), cervical    Eczema    Full thickness rotator cuff tear    H/O gastric bypass    Heart murmur    since birth   HLD (hyperlipidemia)    Impingement syndrome of shoulder region, left    Persistent headaches    Pre-diabetes    Sleep apnea    had surgery /resolved   Vitamin D  deficiency      Past Surgical History: Past Surgical History:  Procedure Laterality Date   ABDOMINAL HYSTERECTOMY     ADENIDECTOMY     ANTERIOR CERVICAL DECOMP/DISCECTOMY FUSION  03/13/2012   Procedure: ANTERIOR CERVICAL DECOMPRESSION/DISCECTOMY FUSION 2 LEVELS;  Surgeon: Lamar LELON Peaches, MD;  Location: [facility];  Service: Neurosurgery;  Laterality: N/A;  Cervical Five-Six Cervical Six-Seven Anterior Cervical Decompression with fusion plating and bonegraft   COLONOSCOPY WITH PROPOFOL  N/A 01/07/2018   Procedure: COLONOSCOPY WITH PROPOFOL ;  Surgeon: Toledo, Ladell POUR, MD;  Location: [facility];  Service: Gastroenterology;  Laterality: N/A;    GASTRIC BYPASS  2003   LUMBAR LAMINECTOMY/DECOMPRESSION MICRODISCECTOMY Left 05/14/2013   Procedure: LUMBAR LAMINECTOMY/DECOMPRESSION MICRODISCECTOMY 1 LEVEL;  Surgeon: Lamar LELON Peaches, MD;  Location: [facility];  Service: Neurosurgery;  Laterality: Left;  LEFT Lumbar four-five4 extraforaminal microdiskectomy   POSTERIOR CERVICAL FUSION/FORAMINOTOMY N/A 11/05/2013   Procedure: Cervical five-six posterior arthrodesis and instrumentation;  Surgeon: Lamar LELON Peaches, MD;  Location: [facility];  Service: Neurosurgery;  Laterality: N/A;  Cervical five-six posterior arthrodesis and instrumentation   SHOULDER ARTHROSCOPY WITH SUBACROMIAL DECOMPRESSION, ROTATOR CUFF REPAIR AND BICEP TENDON REPAIR Left 08/22/2023   Procedure: LEFT SHOULDER ARTHROSCOPY WITH DEBRIDEMENT, DECOMPRESSION, DISTAL CLAVICLE EXCISION, BICEPS TENODESIS, AND MINI OPEN ROTATOR CUFF REPAIR.;  Surgeon: Edie Norleen PARAS, MD;  Location: [facility];  Service: Orthopedics;  Laterality: Left;   TONSILLECTOMY     TOTAL KNEE ARTHROPLASTY Left 11/09/2021   Procedure: TOTAL KNEE ARTHROPLASTY;  Surgeon: Kathlynn Sharper, MD;  Location: [facility];  Service: Orthopedics;  Laterality: Left;   TOTAL KNEE ARTHROPLASTY Right 05/29/2022   Procedure: TOTAL KNEE ARTHROPLASTY;  Surgeon: Kathlynn Sharper, MD;  Location: [facility];  Service: Orthopedics;  Laterality: Right;    Home Medications: Prior to Admission medications   Medication Sig Start Date End Date Taking? Authorizing Provider  Cholecalciferol  (VITAMIN D3) 125 MCG (5000 UT) CAPS Take 1 capsule (5,000 Units total) by mouth daily. 01/09/24  Yes Scoggins, Amber, NP  cyclobenzaprine  (FLEXERIL ) 10 MG tablet TAKE 1 TABLET(10 MG) BY MOUTH THREE TIMES DAILY 08/20/24  Yes Scoggins, Amber, NP  gabapentin  (NEURONTIN ) 300 MG capsule Take 300 mg by mouth 3 (three) times daily.   Yes [provider]  metFORMIN  (GLUCOPHAGE ) 500 MG tablet Take 1 tablet (500 mg total) by mouth 2 (two) times daily with a meal.  08/18/24  Yes Scoggins, Amber, NP  methylPREDNISolone  (MEDROL  DOSEPAK) 4 MG TBPK tablet As directed 08/18/24  Yes Scoggins, Amber, NP  rosuvastatin  (CRESTOR ) 20 MG tablet Take 1 tablet (20 mg total) by mouth daily. 03/04/24  Yes Orlean Alan HERO, FNP  Ubrogepant  (UBRELVY ) 100 MG TABS Take 1 tablet (100 mg total) by mouth as needed. 10/24/23  Yes Scoggins, Hospital doctor, NP  cephALEXin  (KEFLEX ) 500 MG capsule Take 1 capsule (500 mg total) by mouth 2 (two) times daily for 7 days. 08/24/24 08/31/24  Desiderio Schanz, MD    Allergies: Allergies  Allergen Reactions   Other Itching and Rash    Cat Dander   Fish Allergy Rash   Penicillins Itching and Rash    TOLERATED CEFAZOLIN    Shellfish Allergy Rash    Social History:  reports that she quit smoking about 14 years ago. Her smoking use included cigarettes. She started smoking about 49 years ago. She has a 3.5 pack-year smoking history. She has never used smokeless tobacco. She reports that she does not drink alcohol and does not use drugs.   Family History: Family History  Problem Relation Age of Onset   Hyperlipidemia Father    Hypertension Father    Diabetes Father    Breast cancer Neg Hx     Review of Systems: Review of Systems  Constitutional:  Positive for fever. Negative for chills.  Respiratory:  Negative for shortness of breath.   Cardiovascular:  Negative for chest pain.  Gastrointestinal:  Positive for nausea. Negative for vomiting.  Skin:        Infected cyst of the suprapubic area, with drainage and tenderness.  Psychiatric/Behavioral:  The patient is nervous/anxious.     Physical Exam BP 139/89   Pulse 84   Temp 98.2 F (36.8 C) (Oral)   Ht 5' 6 (1.676 m)   Wt 296 lb 6.4 oz (134.4 kg)   SpO2 96%   BMI 47.84 kg/m  CONSTITUTIONAL: No acute distress HEENT:  Normocephalic, atraumatic, extraocular motion intact. RESPIRATORY:  Normal respiratory effort without pathologic use of accessory muscles. MUSCULOSKELETAL:  Normal muscle  strength and tone in all four extremities.  No peripheral edema or cyanosis. SKIN: The patient has an area of about 1 cm size in the right suprapubic region with two small open wounds.  These were squeezed and thick pasty material was expressed.  This is likely consistent with an epidermal inclusion cyst.  After the material was squeezed, dry gauze dressing was applied. NEUROLOGIC:  Motor and sensation is grossly normal.  Cranial nerves are grossly intact. PSYCH:  Alert and oriented to person, place and time. Affect is normal.  Laboratory Analysis: No results found for this or any previous visit (from the past 24 hours).  Imaging: No results found.  Assessment and Plan: This is a 59 y.o. female with an infected epidermal inclusion cyst  --Discussed with the patient that this is likely a cyst from the skin, likely related to hair follicle.  Although overall these can be asymptomatic, in her case it became infected and started draining.  I was able to  squeeze the pasty material out of the cyst cavity.  Dressed with dry gauze dressing.  Discussed that would reorder her Keflex  prescription as a precaution, and then we can see her for a procedure visit to excise the cyst to prevent further recurrences.  Reviewed the procedure at length with her and she's willing to proceed. --Will schedule her for procedure visit in 3 weeks.  Return precautions given.  I spent 20 minutes dedicated to the care of this patient on the date of this encounter to include pre-visit review of records, face-to-face time with the patient discussing diagnosis and management, and any post-visit coordination of care.   Aloysius Sheree Plant, MD  [facility]

## 2024-08-25 ENCOUNTER — Ambulatory Visit: Admitting: Podiatry

## 2024-09-01 ENCOUNTER — Ambulatory Visit (INDEPENDENT_AMBULATORY_CARE_PROVIDER_SITE_OTHER): Admitting: Podiatry

## 2024-09-01 ENCOUNTER — Encounter: Payer: Self-pay | Admitting: Podiatry

## 2024-09-01 ENCOUNTER — Ambulatory Visit (INDEPENDENT_AMBULATORY_CARE_PROVIDER_SITE_OTHER)

## 2024-09-01 VITALS — Ht 66.0 in | Wt 296.4 lb

## 2024-09-01 DIAGNOSIS — M722 Plantar fascial fibromatosis: Secondary | ICD-10-CM

## 2024-09-01 DIAGNOSIS — M2041 Other hammer toe(s) (acquired), right foot: Secondary | ICD-10-CM | POA: Diagnosis not present

## 2024-09-01 DIAGNOSIS — M65972 Unspecified synovitis and tenosynovitis, left ankle and foot: Secondary | ICD-10-CM

## 2024-09-01 DIAGNOSIS — M2042 Other hammer toe(s) (acquired), left foot: Secondary | ICD-10-CM

## 2024-09-01 MED ORDER — MELOXICAM 15 MG PO TABS: 15.0000 mg | ORAL_TABLET | Freq: Every day | ORAL | 1 refills | Status: DC

## 2024-09-01 MED ORDER — BETAMETHASONE SOD PHOS & ACET 6 (3-3) MG/ML IJ SUSP: 3.0000 mg | Freq: Once | INTRAMUSCULAR | Status: DC

## 2024-09-01 MED ORDER — METHYLPREDNISOLONE 4 MG PO TBPK: ORAL_TABLET | ORAL | 0 refills | Status: DC

## 2024-09-01 NOTE — Progress Notes (Signed)
 Chief Complaint  Patient presents with   Foot Pain    Pt is here due to left foot pain, states she was seen at the urgent care due to this issue x-rays were done she was told that she had spurs in the heel of her boot, was given brace to support the foot it did not help, OTC meds taken with little relief.    HPI: 58 y.o. female presenting today for above complaint  Past Medical History:  Diagnosis Date   Allergy    Anemia    Arthritis    DDD (degenerative disc disease), cervical    Eczema    Full thickness rotator cuff tear    H/O gastric bypass    Heart murmur    since birth   HLD (hyperlipidemia)    Impingement syndrome of shoulder region, left    Persistent headaches    Pre-diabetes    Sleep apnea    had surgery /resolved   Vitamin D  deficiency     Past Surgical History:  Procedure Laterality Date   ABDOMINAL HYSTERECTOMY     ADENIDECTOMY     ANTERIOR CERVICAL DECOMP/DISCECTOMY FUSION  03/13/2012   Procedure: ANTERIOR CERVICAL DECOMPRESSION/DISCECTOMY FUSION 2 LEVELS;  Surgeon: Lamar LELON Peaches, MD;  Location: [facility];  Service: Neurosurgery;  Laterality: N/A;  Cervical Five-Six Cervical Six-Seven Anterior Cervical Decompression with fusion plating and bonegraft   COLONOSCOPY WITH PROPOFOL  N/A 01/07/2018   Procedure: COLONOSCOPY WITH PROPOFOL ;  Surgeon: Toledo, Ladell POUR, MD;  Location: [facility];  Service: Gastroenterology;  Laterality: N/A;   GASTRIC BYPASS  2003   LUMBAR LAMINECTOMY/DECOMPRESSION MICRODISCECTOMY Left 05/14/2013   Procedure: LUMBAR LAMINECTOMY/DECOMPRESSION MICRODISCECTOMY 1 LEVEL;  Surgeon: Lamar LELON Peaches, MD;  Location: [facility];  Service: Neurosurgery;  Laterality: Left;  LEFT Lumbar four-five4 extraforaminal microdiskectomy   POSTERIOR CERVICAL FUSION/FORAMINOTOMY N/A 11/05/2013   Procedure: Cervical five-six posterior arthrodesis and instrumentation;  Surgeon: Lamar LELON Peaches, MD;  Location: [facility];  Service:  Neurosurgery;  Laterality: N/A;  Cervical five-six posterior arthrodesis and instrumentation   SHOULDER ARTHROSCOPY WITH SUBACROMIAL DECOMPRESSION, ROTATOR CUFF REPAIR AND BICEP TENDON REPAIR Left 08/22/2023   Procedure: LEFT SHOULDER ARTHROSCOPY WITH DEBRIDEMENT, DECOMPRESSION, DISTAL CLAVICLE EXCISION, BICEPS TENODESIS, AND MINI OPEN ROTATOR CUFF REPAIR.;  Surgeon: Edie Norleen PARAS, MD;  Location: [facility];  Service: Orthopedics;  Laterality: Left;   TONSILLECTOMY     TOTAL KNEE ARTHROPLASTY Left 11/09/2021   Procedure: TOTAL KNEE ARTHROPLASTY;  Surgeon: Kathlynn Sharper, MD;  Location: [facility];  Service: Orthopedics;  Laterality: Left;   TOTAL KNEE ARTHROPLASTY Right 05/29/2022   Procedure: TOTAL KNEE ARTHROPLASTY;  Surgeon: Kathlynn Sharper, MD;  Location: [facility];  Service: Orthopedics;  Laterality: Right;    Allergies  Allergen Reactions   Other Itching and Rash    Cat Dander   Fish Allergy Rash   Penicillins Itching and Rash    TOLERATED CEFAZOLIN    Shellfish Allergy Rash     Physical Exam: General: The patient is alert and oriented x3 in no acute distress.  Dermatology: Skin is warm, dry and supple bilateral lower extremities.   Vascular: Palpable pedal pulses bilaterally. Capillary refill within normal limits.  No appreciable edema.  No erythema.  Neurological: Grossly intact via light touch  Musculoskeletal Exam: No pedal deformities noted. Tenderness to palpation noted to the anterolateral aspect of the ankle joint as well as plantar fascia LT.   Radiographic Exam LT foot 09/01/2024:  Normal osseous mineralization. Joint spaces preserved.  Posterior and plantar heel spurs noted on lateral view. Possible degenerative changes noted to the posterior aspect of the posterior facet of the subtalar joint.   Assessment/Plan of Care: 1. Synovitis left ankle 2. Plantar fasciitis left  Patient evaluated. Xrays reviewed Injection of 0.5cc Celestone  Soluspan injected into the plantar fascia  left and anterolateral aspect of the ankle left.  Rx Medrol  Dosepak Rx Meloxicam  15mg  daily CAM boot dispensed. WBAT x 4 weeks Return to clinic 4 weeks       Thresa EMERSON Sar, DPM [facility]  Dr. Thresa EMERSON Sar, DPM    [address] [place], [place] [address]                Office [phone]  Fax [phone]

## 2024-09-14 ENCOUNTER — Encounter: Payer: Self-pay | Admitting: Surgery

## 2024-09-14 ENCOUNTER — Ambulatory Visit (INDEPENDENT_AMBULATORY_CARE_PROVIDER_SITE_OTHER): Admitting: Surgery

## 2024-09-14 VITALS — BP 129/84 | HR 80 | Temp 98.0°F | Ht 66.0 in | Wt 308.0 lb

## 2024-09-14 DIAGNOSIS — L723 Sebaceous cyst: Secondary | ICD-10-CM | POA: Diagnosis not present

## 2024-09-14 DIAGNOSIS — L720 Epidermal cyst: Secondary | ICD-10-CM

## 2024-09-14 NOTE — Patient Instructions (Signed)
 We have removed a Cyst in our office today.  You have sutures under the skin that will dissolve and also dermabond (skin glue) on top of your skin which will come off on it's own in 10-14 days.  You may use Ibuprofen  or Tylenol  as needed for pain control. Use the ice pack 3-4 times a day for the next two days for any achiness.  You may shower 09/15/24. Do not scrub at the area.   Avoid Strenuous activities that will make you sweat during the next 48 hours to avoid the glue coming off prematurely. Avoid activities that will place pressure to this area of the body for 1-2 weeks to avoid re-injury to incision site.  Please see your follow-up appointment provided. We will see you back in office to make sure this area is healed and to review the final pathology. If you have any questions or concerns prior to this appointment, call our office and speak with a nurse.    Excision of Skin Cysts or Lesions Excision of a skin lesion refers to the removal of a section of skin by making small cuts (incisions) in the skin. This procedure may be done to remove a cancerous (malignant) or noncancerous (benign) growth on the skin. It is typically done to treat or prevent cancer or infection. It may also be done to improve cosmetic appearance. The procedure may be done to remove: Cancerous growths, such as basal cell carcinoma, squamous cell carcinoma, or melanoma. Noncancerous growths, such as a cyst or lipoma. Growths, such as moles or skin tags, which may be removed for cosmetic reasons.  Various excision or surgical techniques may be used depending on your condition, the location of the lesion, and your overall health. Tell a health care provider about: Any allergies you have. All medicines you are taking, including vitamins, herbs, eye drops, creams, and over-the-counter medicines. Any problems you or family members have had with anesthetic medicines. Any blood disorders you have. Any surgeries you have  had. Any medical conditions you have. Whether you are pregnant or may be pregnant. What are the risks? Generally, this is a safe procedure. However, problems may occur, including: Bleeding. Infection. Scarring. Recurrence of the cyst, lipoma, or cancer. Changes in skin sensation or appearance, such as discoloration or swelling. Reaction to the anesthetics. Allergic reaction to surgical materials or ointments. Damage to nerves, blood vessels, muscles, or other structures. Continued pain.  What happens before the procedure? Ask your health care provider about: Changing or stopping your regular medicines. This is especially important if you are taking diabetes medicines or blood thinners. Taking medicines such as aspirin and ibuprofen . These medicines can thin your blood. Do not take these medicines before your procedure if your health care provider instructs you not to. You may be asked to take certain medicines. You may be asked to stop smoking. You may have an exam or testing. What happens during the procedure? To reduce your risk of infection: Your health care team will wash or sanitize their hands. Your skin will be washed with soap. You will be given a medicine to numb the area (local anesthetic). One of the following excision techniques will be performed. At the end of any of these procedures, antibiotic ointment will be applied as needed. Each of the following techniques may vary among health care providers and hospitals. Complete Surgical Excision The area of skin that needs to be removed will be marked with a pen. Using a small scalpel or scissors, the  surgeon will gently cut around and under the lesion until it is completely removed. The lesion will be placed in a fluid and sent to the lab for examination. If necessary, bleeding will be controlled with a device that delivers heat (electrocautery). The edges of the wound may be stitched (sutured) together, and a bandage  (dressing) or surgical glue will be applied. This procedure may be performed to treat a cancerous growth or a noncancerous cyst or lesion.  What happens after the procedure? Return to your normal activities as told by your health care provider. Report any excessive bleeding, spreading redness, or increased pain.

## 2024-09-14 NOTE — Progress Notes (Signed)
  Procedure Date:  09/14/2024  Pre-operative Diagnosis:  Suprapubic sebaceous cyst  Post-operative Diagnosis:  Suprapubic sebaceous cyst, 1.2 cm  Procedure:  Excision of suprapubic sebaceous cyst; layered closure of 2.8 cm incision.  Surgeon:  Aloysius Sheree Plant, MD  Anesthesia:  General endotracheal  Estimated Blood Loss:  3 ml  Specimens:  Sebaceous cyst  Complications:  None  Indications for Procedure:  This is a 59 y.o. female with diagnosis of a symptomatic suprapubic sebaceous cyst, with prior infection.  The patient wishes to have this excised. The risks of bleeding, abscess or infection, injury to surrounding structures, and need for further procedures were all discussed with the patient and she was willing to proceed.  Description of Procedure: The patient was correctly identified at bedside.  The patient was placed supine.  Appropriate time-outs were performed.  The patient's suprapubic area was prepped and draped in usual sterile fashion.  Local anesthetic was infused intradermally.  A 2.8 cm elliptical incision was made over the cyst, and scalpel was used to dissect down the skin and subcutaneous tissue.  Skin flaps were created sharply, and then the cyst was excised intact.  It was sent off to pathology.  The cavity was then irrigated and hemostasis was assured with manual pressure.  The wound was then closed in two layers using 3-0 Vicryl and 4-0 Monocryl.  The incision was cleaned and sealed with DermaBond.  The patient tolerated the procedure well and all sharps were appropriately disposed of at the end of the case.  -- Patient may shower tomorrow. - May take Tylenol  or ibuprofen  for pain control. - Activity restrictions discussed. - Follow-up in 10 days.  Aloysius Sheree Plant, MD

## 2024-09-15 ENCOUNTER — Ambulatory Visit: Admitting: Cardiology

## 2024-09-15 ENCOUNTER — Encounter: Payer: Self-pay | Admitting: Cardiology

## 2024-09-15 VITALS — BP 124/80 | HR 79 | Ht 66.0 in | Wt 313.4 lb

## 2024-09-15 DIAGNOSIS — Z0130 Encounter for examination of blood pressure without abnormal findings: Secondary | ICD-10-CM | POA: Diagnosis not present

## 2024-09-15 DIAGNOSIS — R7303 Prediabetes: Secondary | ICD-10-CM | POA: Diagnosis not present

## 2024-09-15 DIAGNOSIS — Z713 Dietary counseling and surveillance: Secondary | ICD-10-CM

## 2024-09-15 DIAGNOSIS — Z6841 Body Mass Index (BMI) 40.0 and over, adult: Secondary | ICD-10-CM

## 2024-09-15 LAB — SURGICAL PATHOLOGY

## 2024-09-15 MED ORDER — ZEPBOUND 2.5 MG/0.5ML ~~LOC~~ SOAJ: 2.5000 mg | SUBCUTANEOUS | 3 refills | Status: DC

## 2024-09-15 NOTE — Progress Notes (Signed)
 Established Patient Office Visit  Subjective:  Patient ID: Monique Patton, female    DOB: 1965/10/17  Age: 59 y.o. MRN: [id number]  Chief Complaint  Patient presents with   Follow-up    4 week follow up     Patient in office for 4 week follow up. Patient started on metformin  at previous visit to help with weight loss. Patient states she did not feel well taking the metformin  so she stopped it, made her feel sluggish. Patient requesting Zepbound , states her insurance should pay for it. Discussed other options also, bariatric surgery. Patient had gastric bypass in 2003. Patient will consider if Zepbound  not approved.     No other concerns at this time.   Past Medical History:  Diagnosis Date   Allergy    Anemia    Arthritis    DDD (degenerative disc disease), cervical    Eczema    Full thickness rotator cuff tear    H/O gastric bypass    Heart murmur    since birth   HLD (hyperlipidemia)    Impingement syndrome of shoulder region, left    Persistent headaches    Pre-diabetes    Sleep apnea    had surgery /resolved   Vitamin D  deficiency     Past Surgical History:  Procedure Laterality Date   ABDOMINAL HYSTERECTOMY     ADENIDECTOMY     ANTERIOR CERVICAL DECOMP/DISCECTOMY FUSION  03/13/2012   Procedure: ANTERIOR CERVICAL DECOMPRESSION/DISCECTOMY FUSION 2 LEVELS;  Surgeon: Lamar LELON Peaches, MD;  Location: [facility];  Service: Neurosurgery;  Laterality: N/A;  Cervical Five-Six Cervical Six-Seven Anterior Cervical Decompression with fusion plating and bonegraft   COLONOSCOPY WITH PROPOFOL  N/A 01/07/2018   Procedure: COLONOSCOPY WITH PROPOFOL ;  Surgeon: Toledo, Ladell POUR, MD;  Location: [facility];  Service: Gastroenterology;  Laterality: N/A;   GASTRIC BYPASS  2003   LUMBAR LAMINECTOMY/DECOMPRESSION MICRODISCECTOMY Left 05/14/2013   Procedure: LUMBAR LAMINECTOMY/DECOMPRESSION MICRODISCECTOMY 1 LEVEL;  Surgeon: Lamar LELON Peaches, MD;  Location: [facility];  Service:  Neurosurgery;  Laterality: Left;  LEFT Lumbar four-five4 extraforaminal microdiskectomy   POSTERIOR CERVICAL FUSION/FORAMINOTOMY N/A 11/05/2013   Procedure: Cervical five-six posterior arthrodesis and instrumentation;  Surgeon: Lamar LELON Peaches, MD;  Location: [facility];  Service: Neurosurgery;  Laterality: N/A;  Cervical five-six posterior arthrodesis and instrumentation   SHOULDER ARTHROSCOPY WITH SUBACROMIAL DECOMPRESSION, ROTATOR CUFF REPAIR AND BICEP TENDON REPAIR Left 08/22/2023   Procedure: LEFT SHOULDER ARTHROSCOPY WITH DEBRIDEMENT, DECOMPRESSION, DISTAL CLAVICLE EXCISION, BICEPS TENODESIS, AND MINI OPEN ROTATOR CUFF REPAIR.;  Surgeon: Edie Norleen PARAS, MD;  Location: [facility];  Service: Orthopedics;  Laterality: Left;   TONSILLECTOMY     TOTAL KNEE ARTHROPLASTY Left 11/09/2021   Procedure: TOTAL KNEE ARTHROPLASTY;  Surgeon: Kathlynn Sharper, MD;  Location: [facility];  Service: Orthopedics;  Laterality: Left;   TOTAL KNEE ARTHROPLASTY Right 05/29/2022   Procedure: TOTAL KNEE ARTHROPLASTY;  Surgeon: Kathlynn Sharper, MD;  Location: [facility];  Service: Orthopedics;  Laterality: Right;    Social History   Socioeconomic History   Marital status: Married    Spouse name: Alm   Number of children: 1   Years of education: Not on file   Highest education level: Not on file  Occupational History   Not on file  Tobacco Use   Smoking status: Former    Current packs/day: 0.00    Average packs/day: 0.1 packs/day for 35.0 years (3.5 ttl pk-yrs)    Types: Cigarettes    Start date: 01/07/1975  Quit date: 01/07/2010    Years since quitting: 14.6   Smokeless tobacco: Never  Vaping Use   Vaping status: Never Used  Substance and Sexual Activity   Alcohol use: No   Drug use: No   Sexual activity: Not on file  Other Topics Concern   Not on file  Social History Narrative   Not on file   Social Drivers of Health   Financial Resource Strain: Medium Risk (03/18/2024)   Received from [facility]   Overall Financial Resource Strain (CARDIA)    Difficulty of Paying Living Expenses: Somewhat hard  Food Insecurity: Food Insecurity Present (03/18/2024)   Received from [facility]   Hunger Vital Sign    Within the past 12 months, you worried that your food would run out before you got the money to buy more.: Sometimes true    Within the past 12 months, the food you bought just didn't last and you didn't have money to get more.: Sometimes true  Transportation Needs: No Transportation Needs (03/18/2024)   Received from [facility]    In the past 12 months, has lack of transportation kept you from medical appointments or from getting medications?: No    Lack of Transportation (Non-Medical): No  Physical Activity: Not on file  Stress: Not on file  Social Connections: Not on file  Intimate Partner Violence: Not on file    Family History  Problem Relation Age of Onset   Hyperlipidemia Father    Hypertension Father    Diabetes Father    Breast cancer Neg Hx     Allergies  Allergen Reactions   Other Itching and Rash    Cat Dander   Fish Allergy Rash   Penicillins Itching and Rash    TOLERATED CEFAZOLIN    Shellfish Allergy Rash    Outpatient Medications Prior to Visit  Medication Sig   Cholecalciferol  (VITAMIN D3) 125 MCG (5000 UT) CAPS Take 1 capsule (5,000 Units total) by mouth daily.   cyclobenzaprine  (FLEXERIL ) 10 MG tablet TAKE 1 TABLET(10 MG) BY MOUTH THREE TIMES DAILY   gabapentin  (NEURONTIN ) 300 MG capsule Take 300 mg by mouth 3 (three) times daily.   meloxicam  (MOBIC ) 15 MG tablet Take 1 tablet (15 mg total) by mouth daily.   rosuvastatin  (CRESTOR ) 20 MG tablet Take 1 tablet (20 mg total) by mouth daily.   Ubrogepant  (UBRELVY ) 100 MG TABS Take 1 tablet (100 mg total) by mouth as needed.   [DISCONTINUED] metFORMIN  (GLUCOPHAGE ) 500 MG tablet Take 1 tablet (500 mg total) by mouth 2 (two) times  daily with a meal.   [DISCONTINUED] methylPREDNISolone  (MEDROL  DOSEPAK) 4 MG TBPK tablet 6 day dose pack - take as directed   Facility-Administered Medications Prior to Visit  Medication Dose Route Frequency Provider   betamethasone  acetate-betamethasone  sodium phosphate  (CELESTONE ) injection 3 mg  3 mg Intra-articular Once     Review of Systems  Constitutional: Negative.   HENT: Negative.    Eyes: Negative.   Respiratory: Negative.  Negative for shortness of breath.   Cardiovascular: Negative.  Negative for chest pain.  Gastrointestinal: Negative.  Negative for abdominal pain, constipation and diarrhea.  Genitourinary: Negative.   Musculoskeletal:  Negative for joint pain and myalgias.  Skin: Negative.   Neurological: Negative.  Negative for dizziness and headaches.  Endo/Heme/Allergies: Negative.   All other systems reviewed and are negative.      Objective:   BP 124/80  Pulse 79   Ht 5' 6 (1.676 m)   Wt (!) 313 lb 6.4 oz (142.2 kg)   SpO2 97%   BMI 50.58 kg/m   Vitals:   09/15/24 0950  BP: 124/80  Pulse: 79  Height: 5' 6 (1.676 m)  Weight: (!) 313 lb 6.4 oz (142.2 kg)  SpO2: 97%  BMI (Calculated): 50.61    Physical Exam Vitals and nursing note reviewed.  Constitutional:      Appearance: Normal appearance. She is normal weight.  HENT:     Head: Normocephalic and atraumatic.     Nose: Nose normal.     Mouth/Throat:     Mouth: Mucous membranes are moist.  Eyes:     Extraocular Movements: Extraocular movements intact.     Conjunctiva/sclera: Conjunctivae normal.     Pupils: Pupils are equal, round, and reactive to light.  Cardiovascular:     Rate and Rhythm: Normal rate and regular rhythm.     Pulses: Normal pulses.     Heart sounds: Normal heart sounds.  Pulmonary:     Effort: Pulmonary effort is normal.     Breath sounds: Normal breath sounds.  Abdominal:     General: Abdomen is flat. Bowel sounds are normal.     Palpations: Abdomen is soft.   Musculoskeletal:        General: Normal range of motion.     Cervical back: Normal range of motion.  Skin:    General: Skin is warm and dry.  Neurological:     General: No focal deficit present.     Mental Status: She is alert and oriented to person, place, and time.  Psychiatric:        Mood and Affect: Mood normal.        Behavior: Behavior normal.        Thought Content: Thought content normal.        Judgment: Judgment normal.      No results found for any visits on 09/15/24.  Recent Results (from the past 2160 hours)  CMP14+EGFR     Status: Abnormal   Collection Time: 08/18/24 10:35 AM  Result Value Ref Range   Glucose 90 70 - 99 mg/dL   BUN 15 8 - 27 mg/dL   Creatinine, Ser 9.13 0.57 - 1.00 mg/dL   eGFR 77 >40 fO/fpw/8.26   BUN/Creatinine Ratio 17 12 - 28   Sodium 145 (H) 134 - 144 mmol/L   Potassium 4.3 3.5 - 5.2 mmol/L   Chloride 107 (H) 96 - 106 mmol/L   CO2 24 20 - 29 mmol/L   Calcium  9.4 8.7 - 10.3 mg/dL   Total Protein 6.3 6.0 - 8.5 g/dL   Albumin 4.0 3.8 - 4.9 g/dL   Globulin, Total 2.3 1.5 - 4.5 g/dL   Bilirubin Total 0.3 0.0 - 1.2 mg/dL   Alkaline Phosphatase 129 (H) 44 - 121 IU/L   AST 15 0 - 40 IU/L   ALT 11 0 - 32 IU/L  Lipid Profile     Status: Abnormal   Collection Time: 08/18/24 10:35 AM  Result Value Ref Range   Cholesterol, Total 190 100 - 199 mg/dL   Triglycerides 50 0 - 149 mg/dL   HDL 71 >60 mg/dL   VLDL Cholesterol Cal 10 5 - 40 mg/dL   LDL Chol Calc (NIH) 890 (H) 0 - 99 mg/dL   Chol/HDL Ratio 2.7 0.0 - 4.4 ratio    Comment:  T. Chol/HDL Ratio                                             Men  Women                               1/2 Avg.Risk  3.4    3.3                                   Avg.Risk  5.0    4.4                                2X Avg.Risk  9.6    7.1                                3X Avg.Risk 23.4   11.0   TSH     Status: None   Collection Time: 08/18/24 10:35 AM  Result Value Ref Range    TSH 1.550 0.450 - 4.500 uIU/mL  Hemoglobin A1c     Status: Abnormal   Collection Time: 08/18/24 10:35 AM  Result Value Ref Range   Hgb A1c MFr Bld 6.1 (H) 4.8 - 5.6 %    Comment:          Prediabetes: 5.7 - 6.4          Diabetes: >6.4          Glycemic control for adults with diabetes: <7.0    Est. average glucose Bld gHb Est-mCnc 128 mg/dL      Assessment & Plan:  Zepbound   Problem List Items Addressed This Visit       Other   Body mass index (BMI) 45.0-49.9, adult (HCC) - Primary   Relevant Medications   tirzepatide  (ZEPBOUND ) 2.5 MG/0.5ML Pen   Weight loss counseling, encounter for   Relevant Medications   tirzepatide  (ZEPBOUND ) 2.5 MG/0.5ML Pen    Return in about 6 weeks (around 10/27/2024).   Total time spent: 25 minutes  [employer], NP  09/15/2024   This document may have been prepared by Dragon Voice Recognition software and as such may include unintentional dictation errors.

## 2024-09-17 ENCOUNTER — Other Ambulatory Visit: Payer: Self-pay | Admitting: Cardiology

## 2024-09-22 NOTE — Progress Notes (Signed)
 Encounter Diagnoses: Lumbar spondylosis  Subjective Patient ID: Monique Patton is a 59 y.o. female.  Chief Complaint: Follow-up of the Lower Back    History of Present Illness The patient presents for evaluation of back pain.  She reports experiencing back pain, which she describes as being in her stomach. She received an injection on 09/17/2024, which initially reduced her pain from an 8 to a 7 for approximately 1.5 hours before it returned to its original intensity. She also mentions new pain in her neck and across her shoulders, which she had not experienced prior to the injection. Her physical therapy sessions have concluded. The most severe pain is located in her lower back, specifically on the right side. She occasionally experiences leg pain, but more frequently feels a numbing sensation that extends from the back of her left leg to the top of her foot. This numbness has also been felt in her big toe once or twice. When her lower back pain intensifies to an 8 or 9, she can feel the impact of her feet hitting the ground when she walks, which exacerbates the pain in her lower back and buttocks.  She has not found relief from arthritis medications like diclofenac . She has been prescribed gabapentin , which she takes three times a day, but it has not alleviated her pain.    Objective   Physical Exam Neurological: Normal strength in the lower extremities  Good strength in her lower extremities.  She has pain with superficial touch in her lower back.   Results   No XR results found for this visit  X-rays and MRI scan reviewed demonstrate spondylosis.  She has some moderate stenosis at L4-5 where there is some lateral recess stenosis.    Assessment & Plan 1. Lumbar spondylosis. Persistent lower back pain rated at an 8 or 9, which worsens with walking and radiates down the left leg to the foot, causing numbness. Previous injection provided minimal relief. MRI scan shows moderate  stenosis at L4-5 with lateral recess stenosis symptoms. Neurological examination is normal. An anti-inflammatory medication is recommended. A work Product manager and further physical therapy at [facility] in [place] are advised. No surgical solution is deemed predictably reliable at this time. The prescription for the anti-inflammatory medication should be picked up, and arrangements for physical therapy will be communicated.

## 2024-09-23 ENCOUNTER — Ambulatory Visit (INDEPENDENT_AMBULATORY_CARE_PROVIDER_SITE_OTHER): Admitting: Physician Assistant

## 2024-09-23 ENCOUNTER — Encounter: Payer: Self-pay | Admitting: Physician Assistant

## 2024-09-23 VITALS — BP 115/76 | HR 99 | Ht 66.0 in | Wt 306.0 lb

## 2024-09-23 DIAGNOSIS — L720 Epidermal cyst: Secondary | ICD-10-CM

## 2024-09-23 DIAGNOSIS — Z09 Encounter for follow-up examination after completed treatment for conditions other than malignant neoplasm: Secondary | ICD-10-CM

## 2024-09-23 DIAGNOSIS — L723 Sebaceous cyst: Secondary | ICD-10-CM

## 2024-09-23 NOTE — Patient Instructions (Addendum)
   Follow-up with our office as needed.  Please call and ask to speak with a nurse if you develop questions or concerns.

## 2024-09-23 NOTE — Progress Notes (Signed)
 [facility] POST-OP OFFICE VISIT  09/23/2024  HPI: Monique Patton is a 59 y.o. female 9 days s/p excision of suprapubic cyst with Dr Desiderio   She is doing well No pain No fever, chills No drainage from incisions No other complaints   Vital signs: BP 115/76   Pulse 99   Ht 5' 6 (1.676 m)   Wt (!) 306 lb (138.8 kg)   SpO2 98%   BMI 49.39 kg/m    Physical Exam: Constitutional: Well appearing female, NAD Skin: Chaperone present, ~3 cm incision to the suprapubic region, this is well healed, no erythema nor drainage, non-tender  Assessment/Plan: This is a 59 y.o. female 9 days s/p excision of suprapubic cyst with Dr Desiderio    - Pain control prn  - Reviewed wound care recommendation  - Reviewed surgical pathology; EIC  - She can follow up on as needed basis; She understands to call with questions/concerns  -- Arthea Platt, PA-C  [facility] 09/23/2024, 3:33 PM M-F: 7am - 4pm

## 2024-09-29 ENCOUNTER — Ambulatory Visit (INDEPENDENT_AMBULATORY_CARE_PROVIDER_SITE_OTHER): Admitting: Podiatry

## 2024-09-29 ENCOUNTER — Encounter: Payer: Self-pay | Admitting: Podiatry

## 2024-09-29 VITALS — Ht 66.0 in | Wt 306.0 lb

## 2024-09-29 DIAGNOSIS — M65972 Unspecified synovitis and tenosynovitis, left ankle and foot: Secondary | ICD-10-CM | POA: Diagnosis not present

## 2024-09-29 DIAGNOSIS — M722 Plantar fascial fibromatosis: Secondary | ICD-10-CM | POA: Diagnosis not present

## 2024-09-29 MED ORDER — BETAMETHASONE SOD PHOS & ACET 6 (3-3) MG/ML IJ SUSP
3.0000 mg | Freq: Once | INTRAMUSCULAR | Status: AC
  Administered 2024-09-29: 3 mg via INTRA_ARTICULAR

## 2024-09-29 NOTE — Progress Notes (Signed)
 Chief Complaint  Patient presents with   Foot Pain    Pt is here to f/u on left foot and ankle pain, she states that it is better then before, the pain is not everyday come and goes.    HPI: 59 y.o. female presenting today for follow-up eval ration of left ankle pain and plantar fasciitis.  Overall she has noticed improvement.  She says the injections helped significantly  Past Medical History:  Diagnosis Date   Allergy    Anemia    Arthritis    DDD (degenerative disc disease), cervical    Eczema    Full thickness rotator cuff tear    H/O gastric bypass    Heart murmur    since birth   HLD (hyperlipidemia)    Impingement syndrome of shoulder region, left    Persistent headaches    Pre-diabetes    Sleep apnea    had surgery /resolved   Vitamin D  deficiency     Past Surgical History:  Procedure Laterality Date   ABDOMINAL HYSTERECTOMY     ADENIDECTOMY     ANTERIOR CERVICAL DECOMP/DISCECTOMY FUSION  03/13/2012   Procedure: ANTERIOR CERVICAL DECOMPRESSION/DISCECTOMY FUSION 2 LEVELS;  Surgeon: Lamar LELON Peaches, MD;  Location: [facility];  Service: Neurosurgery;  Laterality: N/A;  Cervical Five-Six Cervical Six-Seven Anterior Cervical Decompression with fusion plating and bonegraft   COLONOSCOPY WITH PROPOFOL  N/A 01/07/2018   Procedure: COLONOSCOPY WITH PROPOFOL ;  Surgeon: Toledo, Ladell POUR, MD;  Location: [facility];  Service: Gastroenterology;  Laterality: N/A;   GASTRIC BYPASS  2003   LUMBAR LAMINECTOMY/DECOMPRESSION MICRODISCECTOMY Left 05/14/2013   Procedure: LUMBAR LAMINECTOMY/DECOMPRESSION MICRODISCECTOMY 1 LEVEL;  Surgeon: Lamar LELON Peaches, MD;  Location: [facility];  Service: Neurosurgery;  Laterality: Left;  LEFT Lumbar four-five4 extraforaminal microdiskectomy   POSTERIOR CERVICAL FUSION/FORAMINOTOMY N/A 11/05/2013   Procedure: Cervical five-six posterior arthrodesis and instrumentation;  Surgeon: Lamar LELON Peaches, MD;  Location: [facility];  Service:  Neurosurgery;  Laterality: N/A;  Cervical five-six posterior arthrodesis and instrumentation   SHOULDER ARTHROSCOPY WITH SUBACROMIAL DECOMPRESSION, ROTATOR CUFF REPAIR AND BICEP TENDON REPAIR Left 08/22/2023   Procedure: LEFT SHOULDER ARTHROSCOPY WITH DEBRIDEMENT, DECOMPRESSION, DISTAL CLAVICLE EXCISION, BICEPS TENODESIS, AND MINI OPEN ROTATOR CUFF REPAIR.;  Surgeon: Edie Norleen PARAS, MD;  Location: [facility];  Service: Orthopedics;  Laterality: Left;   TONSILLECTOMY     TOTAL KNEE ARTHROPLASTY Left 11/09/2021   Procedure: TOTAL KNEE ARTHROPLASTY;  Surgeon: Kathlynn Sharper, MD;  Location: [facility];  Service: Orthopedics;  Laterality: Left;   TOTAL KNEE ARTHROPLASTY Right 05/29/2022   Procedure: TOTAL KNEE ARTHROPLASTY;  Surgeon: Kathlynn Sharper, MD;  Location: [facility];  Service: Orthopedics;  Laterality: Right;    Allergies  Allergen Reactions   Other Itching and Rash    Cat Dander   Fish Allergy Rash   Penicillins Itching and Rash    TOLERATED CEFAZOLIN    Shellfish Allergy Rash     Physical Exam: General: The patient is alert and oriented x3 in no acute distress.  Dermatology: Skin is warm, dry and supple bilateral lower extremities.   Vascular: Palpable pedal pulses bilaterally. Capillary refill within normal limits.  No appreciable edema.  No erythema.  Neurological: Grossly intact via light touch  Musculoskeletal Exam: No pedal deformities noted.  There continues to be some tenderness to palpation noted to the anterolateral aspect of the ankle joint as well as plantar fascia LT.   Radiographic Exam LT foot 09/01/2024:  Normal osseous mineralization. Joint spaces  preserved.  Posterior and plantar heel spurs noted on lateral view. Possible degenerative changes noted to the posterior aspect of the posterior facet of the subtalar joint.   Assessment/Plan of Care: 1. Synovitis left ankle 2. Plantar fasciitis left  -Patient evaluated.  -Injection of 0.5cc Celestone  Soluspan injected into  the plantar fascia left and anterolateral aspect of the ankle left.  -Continue meloxicam  15mg  daily PRN -OTC power step insoles were dispensed for the patient to support the medial longitudinal arch of the foot and potentially alleviate a lot of her foot pain -Return to clinic PRN       Thresa EMERSON Sar, DPM [facility]  Dr. Thresa EMERSON Sar, DPM    [address] [place], [place] [address]                Office [phone]  Fax [phone]

## 2024-10-13 ENCOUNTER — Other Ambulatory Visit: Payer: Self-pay | Admitting: Orthopedic Surgery

## 2024-10-13 DIAGNOSIS — T8484XA Pain due to internal orthopedic prosthetic devices, implants and grafts, initial encounter: Secondary | ICD-10-CM

## 2024-10-18 ENCOUNTER — Ambulatory Visit
Admission: RE | Admit: 2024-10-18 | Discharge: 2024-10-18 | Disposition: A | Discharge status: Home / Self Care | Source: Ambulatory Visit | Attending: Orthopedic Surgery | Admitting: Orthopedic Surgery

## 2024-10-18 DIAGNOSIS — T8484XA Pain due to internal orthopedic prosthetic devices, implants and grafts, initial encounter: Secondary | ICD-10-CM | POA: Diagnosis present

## 2024-10-18 DIAGNOSIS — Z96651 Presence of right artificial knee joint: Secondary | ICD-10-CM | POA: Diagnosis present

## 2024-10-27 ENCOUNTER — Ambulatory Visit: Admitting: Cardiology

## 2024-11-17 ENCOUNTER — Encounter: Payer: Self-pay | Admitting: Cardiology

## 2024-11-17 ENCOUNTER — Ambulatory Visit: Admitting: Cardiology

## 2024-11-17 VITALS — BP 136/84 | HR 101 | Ht 66.0 in | Wt 307.0 lb

## 2024-11-17 DIAGNOSIS — E782 Mixed hyperlipidemia: Secondary | ICD-10-CM | POA: Diagnosis not present

## 2024-11-17 DIAGNOSIS — Z6841 Body Mass Index (BMI) 40.0 and over, adult: Secondary | ICD-10-CM

## 2024-11-17 DIAGNOSIS — R7303 Prediabetes: Secondary | ICD-10-CM | POA: Diagnosis not present

## 2024-11-17 DIAGNOSIS — Z0130 Encounter for examination of blood pressure without abnormal findings: Secondary | ICD-10-CM

## 2024-11-17 DIAGNOSIS — E559 Vitamin D deficiency, unspecified: Secondary | ICD-10-CM

## 2024-11-17 DIAGNOSIS — Z713 Dietary counseling and surveillance: Secondary | ICD-10-CM

## 2024-11-17 MED ORDER — VITAMIN D3 125 MCG (5000 UT) PO CAPS: 5000.0000 [IU] | ORAL_CAPSULE | Freq: Every day | ORAL | 3 refills | Status: AC

## 2024-11-17 MED ORDER — GABAPENTIN 300 MG PO CAPS: 300.0000 mg | ORAL_CAPSULE | Freq: Three times a day (TID) | ORAL | 3 refills | Status: AC

## 2024-11-17 MED ORDER — ROSUVASTATIN CALCIUM 20 MG PO TABS: 20.0000 mg | ORAL_TABLET | Freq: Every day | ORAL | 1 refills | Status: DC

## 2024-11-17 MED ORDER — NALTREXONE-BUPROPION HCL ER 8-90 MG PO TB12: ORAL_TABLET | ORAL | 0 refills | Status: DC

## 2024-11-17 NOTE — Progress Notes (Signed)
 Established Patient Office Visit  Subjective:  Patient ID: Monique Patton, female    DOB: 02/18/1965  Age: 59 y.o. MRN: [id number]  Chief Complaint  Patient presents with   Follow-up    6 week follow up    Patient in office for 6 week follow up. Patient doing well, no new complaints today. Patient was to follow up today after taking Zepbound  for 4 weeks. Insurance would not approved Zepbound . Patient declines referral to bariatric surgery. Will send in naltrexone-bupropion.  Patient due for fasting lab work. Return when fasting.  Due for mammogram, order placed at previous visit. Call to schedule.     No other concerns at this time.   Past Medical History:  Diagnosis Date   Allergy    Anemia    Arthritis    DDD (degenerative disc disease), cervical    Eczema    Full thickness rotator cuff tear    H/O gastric bypass    Heart murmur    since birth   HLD (hyperlipidemia)    Impingement syndrome of shoulder region, left    Persistent headaches    Pre-diabetes    Sleep apnea    had surgery /resolved   Vitamin D  deficiency     Past Surgical History:  Procedure Laterality Date   ABDOMINAL HYSTERECTOMY     ADENIDECTOMY     ANTERIOR CERVICAL DECOMP/DISCECTOMY FUSION  03/13/2012   Procedure: ANTERIOR CERVICAL DECOMPRESSION/DISCECTOMY FUSION 2 LEVELS;  Surgeon: Lamar LELON Peaches, MD;  [facility]: [facility];  Service: Neurosurgery;  Laterality: N/A;  Cervical Five-Six Cervical Six-Seven Anterior Cervical Decompression with fusion plating and bonegraft   COLONOSCOPY WITH PROPOFOL  N/A 01/07/2018   Procedure: COLONOSCOPY WITH PROPOFOL ;  Surgeon: Toledo, Ladell POUR, MD;  Location: [facility];  Service: Gastroenterology;  Laterality: N/A;   GASTRIC BYPASS  2003   LUMBAR LAMINECTOMY/DECOMPRESSION MICRODISCECTOMY Left 05/14/2013   Procedure: LUMBAR LAMINECTOMY/DECOMPRESSION MICRODISCECTOMY 1 LEVEL;  Surgeon: Lamar LELON Peaches, MD;  [facility]: [facility];  Service: Neurosurgery;   Laterality: Left;  LEFT Lumbar four-five4 extraforaminal microdiskectomy   POSTERIOR CERVICAL FUSION/FORAMINOTOMY N/A 11/05/2013   Procedure: Cervical five-six posterior arthrodesis and instrumentation;  Surgeon: Lamar LELON Peaches, MD;  Location: [facility];  Service: Neurosurgery;  Laterality: N/A;  Cervical five-six posterior arthrodesis and instrumentation   SHOULDER ARTHROSCOPY WITH SUBACROMIAL DECOMPRESSION, ROTATOR CUFF REPAIR AND BICEP TENDON REPAIR Left 08/22/2023   Procedure: LEFT SHOULDER ARTHROSCOPY WITH DEBRIDEMENT, DECOMPRESSION, DISTAL CLAVICLE EXCISION, BICEPS TENODESIS, AND MINI OPEN ROTATOR CUFF REPAIR.;  Surgeon: Edie Norleen PARAS, MD;  Location: [facility];  Service: Orthopedics;  Laterality: Left;   TONSILLECTOMY     TOTAL KNEE ARTHROPLASTY Left 11/09/2021   Procedure: TOTAL KNEE ARTHROPLASTY;  Surgeon: Kathlynn Sharper, MD;  Location: [facility];  Service: Orthopedics;  Laterality: Left;   TOTAL KNEE ARTHROPLASTY Right 05/29/2022   Procedure: TOTAL KNEE ARTHROPLASTY;  Surgeon: Kathlynn Sharper, MD;  Location: [facility];  Service: Orthopedics;  Laterality: Right;    Social History   Socioeconomic History   Marital status: Married    Spouse name: Alm   Number of children: 1   Years of education: Not on file   Highest education level: Not on file  Occupational History   Not on file  Tobacco Use   Smoking status: Former    Current packs/day: 0.00    Average packs/day: 0.1 packs/day for 35.0 years (3.5 ttl pk-yrs)    Types: Cigarettes    Start date: 01/07/1975    Quit  date: 01/07/2010    Years since quitting: 14.8    Passive exposure: Past   Smokeless tobacco: Never  Vaping Use   Vaping status: Never Used  Substance and Sexual Activity   Alcohol use: No   Drug use: No   Sexual activity: Not on file  Other Topics Concern   Not on file  Social History Narrative   Not on file   Social Drivers of Health   Financial Resource Strain: Medium Risk (03/18/2024)   Received from [facility]   Overall Financial Resource Strain (CARDIA)    Difficulty of Paying Living Expenses: Somewhat hard  Food Insecurity: Food Insecurity Present (03/18/2024)   Received from [facility]   Hunger Vital Sign    Within the past 12 months, you worried that your food would run out before you got the money to buy more.: Sometimes true    Within the past 12 months, the food you bought just didn't last and you didn't have money to get more.: Sometimes true  Transportation Needs: No Transportation Needs (03/18/2024)   Received from [facility] - Transportation    In the past 12 months, has lack of transportation kept you from medical appointments or from getting medications?: No    Lack of Transportation (Non-Medical): No  Physical Activity: Not on file  Stress: Not on file  Social Connections: Not on file  Intimate Partner Violence: Not on file    Family History  Problem Relation Age of Onset   Hyperlipidemia Father    Hypertension Father    Diabetes Father    Breast cancer Neg Hx     Allergies  Allergen Reactions   Other Itching and Rash    Cat Dander   Fish Allergy Rash   Penicillins Itching and Rash    TOLERATED CEFAZOLIN    Shellfish Allergy Rash    Outpatient Medications Prior to Visit  Medication Sig   cyclobenzaprine  (FLEXERIL ) 10 MG tablet TAKE 1 TABLET(10 MG) BY MOUTH THREE TIMES DAILY   meloxicam  (MOBIC ) 15 MG tablet Take 1 tablet (15 mg total) by mouth daily.   Ubrogepant  (UBRELVY ) 100 MG TABS Take 1 tablet (100 mg total) by mouth as needed.   [DISCONTINUED] Cholecalciferol  (VITAMIN D3) 125 MCG (5000 UT) CAPS Take 1 capsule (5,000 Units total) by mouth daily.   [DISCONTINUED] gabapentin  (NEURONTIN ) 300 MG capsule Take 300 mg by mouth 3 (three) times daily.   [DISCONTINUED] rosuvastatin  (CRESTOR ) 20 MG tablet Take 1 tablet (20 mg total) by mouth daily.   [DISCONTINUED] tirzepatide  (ZEPBOUND ) 2.5 MG/0.5ML  Pen Inject 2.5 mg into the skin once a week. (Patient not taking: Reported on 11/17/2024)   No facility-administered medications prior to visit.    Review of Systems  Constitutional: Negative.   HENT: Negative.    Eyes: Negative.   Respiratory: Negative.  Negative for shortness of breath.   Cardiovascular: Negative.  Negative for chest pain.  Gastrointestinal: Negative.  Negative for abdominal pain, constipation and diarrhea.  Genitourinary: Negative.   Musculoskeletal:  Negative for joint pain and myalgias.  Skin: Negative.   Neurological: Negative.  Negative for dizziness and headaches.  Endo/Heme/Allergies: Negative.   All other systems reviewed and are negative.      Objective:   BP 136/84   Pulse (!) 101   Ht 5' 6 (1.676 m)   Wt (!) 307 lb (139.3 kg)   SpO2 99%   BMI 49.55 kg/m   Vitals:  11/17/24 1332  BP: 136/84  Pulse: (!) 101  Height: 5' 6 (1.676 m)  Weight: (!) 307 lb (139.3 kg)  SpO2: 99%  BMI (Calculated): 49.57    Physical Exam Vitals and nursing note reviewed.  Constitutional:      Appearance: Normal appearance. She is normal weight.  HENT:     Head: Normocephalic and atraumatic.     Nose: Nose normal.     Mouth/Throat:     Mouth: Mucous membranes are moist.  Eyes:     Extraocular Movements: Extraocular movements intact.     Conjunctiva/sclera: Conjunctivae normal.     Pupils: Pupils are equal, round, and reactive to light.  Cardiovascular:     Rate and Rhythm: Normal rate and regular rhythm.     Pulses: Normal pulses.     Heart sounds: Normal heart sounds.  Pulmonary:     Effort: Pulmonary effort is normal.     Breath sounds: Normal breath sounds.  Abdominal:     General: Abdomen is flat. Bowel sounds are normal.     Palpations: Abdomen is soft.  Musculoskeletal:        General: Normal range of motion.     Cervical back: Normal range of motion.  Skin:    General: Skin is warm and dry.  Neurological:     General: No focal deficit  present.     Mental Status: She is alert and oriented to person, place, and time.  Psychiatric:        Mood and Affect: Mood normal.        Behavior: Behavior normal.        Thought Content: Thought content normal.        Judgment: Judgment normal.      No results found for any visits on 11/17/24.  Recent Results (from the past 2160 hours)  Surgical pathology     Status: None   Collection Time: 09/14/24 12:00 AM  Result Value Ref Range   SURGICAL PATHOLOGY      SURGICAL PATHOLOGY [facility] [address] [place], [place] [address] Telephone [phone] or [phone] Fax [phone]  REPORT OF DERMATOPATHOLOGY   Accession #: [id number] Patient Name: MATTIA, OSTERMAN Visit # : [id number]  MRN: [id number] Cytotechnologist: Pearley Rolla Hedger, Dermatopathologist, Electronic Signature DOB/Age 05/20/1965 (Age: 22) Gender: F Collected Date: 09/14/2024 Received Date: 09/14/2024  FINAL DIAGNOSIS       1. Skin (M), suprapubic :       EPIDERMAL INCLUSION CYST       DATE SIGNED OUT: 09/15/2024 ELECTRONIC SIGNATURE : Pearley Md, Hyun, Dermatopathologist, Electronic Signature  MICROSCOPIC DESCRIPTION 1. There is a squamous epithelial lined cyst with  at least a partial granular layer. Keratin material in a layered pattern is evident.   CASE COMMENTS STAINS USED IN DIAGNOSIS: H&E H&E    CLINICAL HISTORY  SPECIMEN(S) OBTAINED 1. Skin (M), Suprapubic  SPECIMEN COMMENTS: SPECIMEN CLINICA L INFORMATION: 1. Cyst    Gross Description 1. Shape:  Includes limited fat, elliptical      Size:  21 x 8 x 9 mm      Lesion:  Clinically mentioned cyst observed      Orientation:  None; all margins inked      Block Summary: Specimen has been submitted in representative sections with 2      cross sections in 2 cassettes labeled 1A and 1B .      2 Block(s) submitted. (erh)  Report signed out from the following location(s) [place].  [facility] [address] [place], [place] [address] CLIA #: [id number]  [facility] [address] [place], [place] [address] CLIA #: [id number]       Assessment & Plan:  Naltrexone-bupropion Return for fasting lab work Schedule mammogram   Problem List Items Addressed This Visit       Other   Body mass index (BMI) 45.0-49.9, adult (HCC) - Primary   Relevant Medications   Naltrexone-buPROPion HCl ER 8-90 MG TB12   Vitamin D  deficiency   Mixed hyperlipidemia   Relevant Medications   rosuvastatin  (CRESTOR ) 20 MG tablet   Prediabetes   Weight loss counseling, encounter for    Return in about 1 month (around 12/17/2024) for fasitng labs prior.   Total time spent: 25 minutes. This time includes review of previous notes and results and patient face to face interaction during today's visit.    Jeoffrey Pollen, NP  11/17/2024   This document may have been prepared by Dragon Voice Recognition software and as such may include unintentional dictation errors.

## 2024-11-19 ENCOUNTER — Other Ambulatory Visit

## 2024-11-19 DIAGNOSIS — Z1329 Encounter for screening for other suspected endocrine disorder: Secondary | ICD-10-CM

## 2024-11-19 DIAGNOSIS — E782 Mixed hyperlipidemia: Secondary | ICD-10-CM

## 2024-11-19 DIAGNOSIS — R7303 Prediabetes: Secondary | ICD-10-CM

## 2024-11-20 ENCOUNTER — Ambulatory Visit: Payer: Self-pay | Admitting: Cardiology

## 2024-11-20 LAB — HEMOGLOBIN A1C
Est. average glucose Bld gHb Est-mCnc: 117 mg/dL
Hgb A1c MFr Bld: 5.7 % — ABNORMAL HIGH (ref 4.8–5.6)

## 2024-11-20 LAB — CMP14+EGFR
ALT: 14 IU/L (ref 0–32)
AST: 15 IU/L (ref 0–40)
Albumin: 4.3 g/dL (ref 3.8–4.9)
Alkaline Phosphatase: 141 IU/L — ABNORMAL HIGH (ref 49–135)
BUN/Creatinine Ratio: 16 (ref 12–28)
BUN: 17 mg/dL (ref 8–27)
Bilirubin Total: 0.6 mg/dL (ref 0.0–1.2)
CO2: 21 mmol/L (ref 20–29)
Calcium: 9.3 mg/dL (ref 8.7–10.3)
Chloride: 101 mmol/L (ref 96–106)
Creatinine, Ser: 1.06 mg/dL — ABNORMAL HIGH (ref 0.57–1.00)
Globulin, Total: 3.2 g/dL (ref 1.5–4.5)
Glucose: 140 mg/dL — ABNORMAL HIGH (ref 70–99)
Potassium: 4.4 mmol/L (ref 3.5–5.2)
Sodium: 139 mmol/L (ref 134–144)
Total Protein: 7.5 g/dL (ref 6.0–8.5)
eGFR: 60 mL/min/1.73 (ref >59)

## 2024-11-20 LAB — LIPID PANEL
Chol/HDL Ratio: 4.8 ratio — ABNORMAL HIGH (ref 0.0–4.4)
Cholesterol, Total: 193 mg/dL (ref 100–199)
HDL: 40 mg/dL (ref >39)
LDL Chol Calc (NIH): 124 mg/dL — ABNORMAL HIGH (ref 0–99)
Triglycerides: 164 mg/dL — ABNORMAL HIGH (ref 0–149)
VLDL Cholesterol Cal: 29 mg/dL (ref 5–40)

## 2024-11-20 LAB — TSH: TSH: 0.572 u[IU]/mL (ref 0.450–4.500)

## 2024-12-17 ENCOUNTER — Ambulatory Visit: Admitting: Cardiology

## 2024-12-18 ENCOUNTER — Ambulatory Visit: Admitting: Cardiology

## 2024-12-22 ENCOUNTER — Ambulatory Visit (INDEPENDENT_AMBULATORY_CARE_PROVIDER_SITE_OTHER): Admitting: Cardiology

## 2024-12-22 ENCOUNTER — Encounter: Payer: Self-pay | Admitting: Cardiology

## 2024-12-22 VITALS — BP 124/80 | HR 86 | Ht 66.0 in | Wt 318.2 lb

## 2024-12-22 DIAGNOSIS — Z6841 Body Mass Index (BMI) 40.0 and over, adult: Secondary | ICD-10-CM | POA: Diagnosis not present

## 2024-12-22 DIAGNOSIS — E782 Mixed hyperlipidemia: Secondary | ICD-10-CM | POA: Diagnosis not present

## 2024-12-22 DIAGNOSIS — R159 Full incontinence of feces: Secondary | ICD-10-CM

## 2024-12-22 DIAGNOSIS — R7303 Prediabetes: Secondary | ICD-10-CM | POA: Diagnosis not present

## 2024-12-22 DIAGNOSIS — Z713 Dietary counseling and surveillance: Secondary | ICD-10-CM | POA: Diagnosis not present

## 2024-12-22 DIAGNOSIS — Z9884 Bariatric surgery status: Secondary | ICD-10-CM

## 2024-12-22 DIAGNOSIS — Z0130 Encounter for examination of blood pressure without abnormal findings: Secondary | ICD-10-CM

## 2024-12-22 MED ORDER — CYCLOBENZAPRINE HCL 10 MG PO TABS: 10.0000 mg | ORAL_TABLET | Freq: Three times a day (TID) | ORAL | 6 refills | Status: AC

## 2024-12-22 MED ORDER — ROSUVASTATIN CALCIUM 20 MG PO TABS: 20.0000 mg | ORAL_TABLET | Freq: Every day | ORAL | 1 refills | Status: AC

## 2024-12-22 NOTE — Progress Notes (Signed)
 "  Established Patient Office Visit  Subjective:  Patient ID: Monique Patton, female    DOB: October 22, 1965  Age: 59 y.o. MRN: [id number]  Chief Complaint  Patient presents with   Follow-up    1 month follow up lab results    Patient in office for one month follow up, discuss recent lab results. Patient doing well overall. No new complaints.  Patient insurance would not cover Contrave  or any GLP1 injectables. Patient requesting a referral to bariatric surgery. Referral sent.  Patient reports having an episode of gastritis with frequent diarrhea in 2024. Since then, patient experiences fecal incontinence when bending over or lifting heavy items. Patient states she does not feel the incontinence, she only knows when she goes to the restroom, or her clothes start to stick to her skin. Will refer to GI.  Discussed recent lab work. Hgb A1c improved. LDL elevated, patient has been out of her statin for about 2 months. Rosuvastatin  refilled.  Blood pressure well controlled.  Continue current medications.     No other concerns at this time.   Past Medical History:  Diagnosis Date   Allergy    Anemia    Arthritis    DDD (degenerative disc disease), cervical    Eczema    Full thickness rotator cuff tear    H/O gastric bypass    Heart murmur    since birth   HLD (hyperlipidemia)    Impingement syndrome of shoulder region, left    Persistent headaches    Pre-diabetes    Sleep apnea    had surgery /resolved   Vitamin D  deficiency     Past Surgical History:  Procedure Laterality Date   ABDOMINAL HYSTERECTOMY     ADENIDECTOMY     ANTERIOR CERVICAL DECOMP/DISCECTOMY FUSION  03/13/2012   Procedure: ANTERIOR CERVICAL DECOMPRESSION/DISCECTOMY FUSION 2 LEVELS;  Surgeon: Lamar LELON Peaches, MD;  Location: [facility];  Service: Neurosurgery;  Laterality: N/A;  Cervical Five-Six Cervical Six-Seven Anterior Cervical Decompression with fusion plating and bonegraft   COLONOSCOPY WITH PROPOFOL  N/A  01/07/2018   Procedure: COLONOSCOPY WITH PROPOFOL ;  Surgeon: Toledo, Ladell POUR, MD;  Location: [facility];  Service: Gastroenterology;  Laterality: N/A;   GASTRIC BYPASS  2003   LUMBAR LAMINECTOMY/DECOMPRESSION MICRODISCECTOMY Left 05/14/2013   Procedure: LUMBAR LAMINECTOMY/DECOMPRESSION MICRODISCECTOMY 1 LEVEL;  Surgeon: Lamar LELON Peaches, MD;  Location: [facility];  Service: Neurosurgery;  Laterality: Left;  LEFT Lumbar four-five4 extraforaminal microdiskectomy   POSTERIOR CERVICAL FUSION/FORAMINOTOMY N/A 11/05/2013   Procedure: Cervical five-six posterior arthrodesis and instrumentation;  Surgeon: Lamar LELON Peaches, MD;  Location: [facility];  Service: Neurosurgery;  Laterality: N/A;  Cervical five-six posterior arthrodesis and instrumentation   SHOULDER ARTHROSCOPY WITH SUBACROMIAL DECOMPRESSION, ROTATOR CUFF REPAIR AND BICEP TENDON REPAIR Left 08/22/2023   Procedure: LEFT SHOULDER ARTHROSCOPY WITH DEBRIDEMENT, DECOMPRESSION, DISTAL CLAVICLE EXCISION, BICEPS TENODESIS, AND MINI OPEN ROTATOR CUFF REPAIR.;  Surgeon: Edie Norleen PARAS, MD;  Location: [facility];  Service: Orthopedics;  Laterality: Left;   TONSILLECTOMY     TOTAL KNEE ARTHROPLASTY Left 11/09/2021   Procedure: TOTAL KNEE ARTHROPLASTY;  Surgeon: Kathlynn Sharper, MD;  Location: [facility];  Service: Orthopedics;  Laterality: Left;   TOTAL KNEE ARTHROPLASTY Right 05/29/2022   Procedure: TOTAL KNEE ARTHROPLASTY;  Surgeon: Kathlynn Sharper, MD;  Location: [facility];  Service: Orthopedics;  Laterality: Right;    Social History   Socioeconomic History   Marital status: Married    Spouse name: Alm   Number of children: 1  Years of education: Not on file   Highest education level: Not on file  Occupational History   Not on file  Tobacco Use   Smoking status: Former    Current packs/day: 0.00    Average packs/day: 0.1 packs/day for 35.0 years (3.5 ttl pk-yrs)    Types: Cigarettes    Start date: 01/07/1975    Quit date: 01/07/2010     Years since quitting: 14.9    Passive exposure: Past   Smokeless tobacco: Never  Vaping Use   Vaping status: Never Used  Substance and Sexual Activity   Alcohol use: No   Drug use: No   Sexual activity: Not on file  Other Topics Concern   Not on file  Social History Narrative   Not on file   Social Drivers of Health   Tobacco Use: Medium Risk (12/22/2024)   Patient History    Smoking Tobacco Use: Former    Smokeless Tobacco Use: Never    Passive Exposure: Past  Physicist, Medical Strain: Medium Risk (12/21/2024)   Received from [facility]   Overall Financial Resource Strain (CARDIA)    Difficulty of Paying Living Expenses: Somewhat hard  Food Insecurity: No Food Insecurity (12/21/2024)   Received from [facility]   Epic    Within the past 12 months, you worried that your food would run out before you got the money to buy more.: Never true    Within the past 12 months, the food you bought just didn't last and you didn't have money to get more.: Never true  Transportation Needs: No Transportation Needs (12/21/2024)   Received from [facility] - Transportation    In the past 12 months, has lack of transportation kept you from medical appointments or from getting medications?: No    Lack of Transportation (Non-Medical): No  Physical Activity: Not on file  Stress: Not on file  Social Connections: Not on file  Intimate Partner Violence: Not on file  Depression (EYV7-0): Not on file  Alcohol Screen: Not on file  Housing: Low Risk  (12/21/2024)   Received from [facility]   Epic    In the last 12 months, was there a time when you were not able to pay the mortgage or rent on time?: No    In the past 12 months, how many times have you moved where you were living?: 0    At any time in the past 12 months, were you homeless or living in a shelter (including now)?: No  Utilities: At Risk (12/21/2024)    Received from [facility]   Epic    In the past 12 months has the electric, gas, oil, or water  company threatened to shut off services in your home?: Already shut off  Health Literacy: Not on file    Family History  Problem Relation Age of Onset   Hyperlipidemia Father    Hypertension Father    Diabetes Father    Breast cancer Neg Hx     Allergies[1]  Show/hide medication list[2]  Review of Systems  Constitutional: Negative.   HENT: Negative.    Eyes: Negative.   Respiratory: Negative.  Negative for shortness of breath.   Cardiovascular: Negative.  Negative for chest pain.  Gastrointestinal: Negative.  Negative for abdominal pain, constipation and diarrhea.  Genitourinary: Negative.   Musculoskeletal:  Negative for joint pain and myalgias.  Skin: Negative.   Neurological: Negative.  Negative for dizziness and headaches.  Endo/Heme/Allergies: Negative.   All other systems reviewed and are negative.      Objective:   BP 124/80   Pulse 86   Ht 5' 6 (1.676 m)   Wt (!) 318 lb 3.2 oz (144.3 kg)   SpO2 99%   BMI 51.36 kg/m   Vitals:   12/22/24 0922  BP: 124/80  Pulse: 86  Height: 5' 6 (1.676 m)  Weight: (!) 318 lb 3.2 oz (144.3 kg)  SpO2: 99%  BMI (Calculated): 51.38    Physical Exam Vitals and nursing note reviewed.  Constitutional:      Appearance: Normal appearance. She is normal weight.  HENT:     Head: Normocephalic and atraumatic.     Nose: Nose normal.     Mouth/Throat:     Mouth: Mucous membranes are moist.  Eyes:     Extraocular Movements: Extraocular movements intact.     Conjunctiva/sclera: Conjunctivae normal.     Pupils: Pupils are equal, round, and reactive to light.  Cardiovascular:     Rate and Rhythm: Normal rate and regular rhythm.     Pulses: Normal pulses.     Heart sounds: Normal heart sounds.  Pulmonary:     Effort: Pulmonary effort is normal.     Breath sounds: Normal breath sounds.  Abdominal:      General: Abdomen is flat. Bowel sounds are normal.     Palpations: Abdomen is soft.  Musculoskeletal:        General: Normal range of motion.     Cervical back: Normal range of motion.  Skin:    General: Skin is warm and dry.  Neurological:     General: No focal deficit present.     Mental Status: She is alert and oriented to person, place, and time.  Psychiatric:        Mood and Affect: Mood normal.        Behavior: Behavior normal.        Thought Content: Thought content normal.        Judgment: Judgment normal.      No results found for any visits on 12/22/24.  Recent Results (from the past 2160 hours)  Hemoglobin A1c     Status: Abnormal   Collection Time: 11/19/24  9:28 AM  Result Value Ref Range   Hgb A1c MFr Bld 5.7 (H) 4.8 - 5.6 %    Comment:          Prediabetes: 5.7 - 6.4          Diabetes: >6.4          Glycemic control for adults with diabetes: <7.0    Est. average glucose Bld gHb Est-mCnc 117 mg/dL  TSH     Status: None   Collection Time: 11/19/24  9:28 AM  Result Value Ref Range   TSH 0.572 0.450 - 4.500 uIU/mL  Lipid Profile     Status: Abnormal   Collection Time: 11/19/24  9:28 AM  Result Value Ref Range   Cholesterol, Total 193 100 - 199 mg/dL   Triglycerides 835 (H) 0 - 149 mg/dL   HDL 40 >60 mg/dL   VLDL Cholesterol Cal 29 5 - 40 mg/dL   LDL Chol Calc (NIH) 875 (H) 0 - 99 mg/dL   Chol/HDL Ratio 4.8 (H) 0.0 - 4.4 ratio    Comment:  T. Chol/HDL Ratio                                             Men  Women                               1/2 Avg.Risk  3.4    3.3                                   Avg.Risk  5.0    4.4                                2X Avg.Risk  9.6    7.1                                3X Avg.Risk 23.4   11.0   CMP14+EGFR     Status: Abnormal   Collection Time: 11/19/24  9:28 AM  Result Value Ref Range   Glucose 140 (H) 70 - 99 mg/dL   BUN 17 8 - 27 mg/dL   Creatinine, Ser 8.93 (H) 0.57 - 1.00 mg/dL    eGFR 60 >40 fO/fpw/8.26   BUN/Creatinine Ratio 16 12 - 28   Sodium 139 134 - 144 mmol/L   Potassium 4.4 3.5 - 5.2 mmol/L   Chloride 101 96 - 106 mmol/L   CO2 21 20 - 29 mmol/L   Calcium  9.3 8.7 - 10.3 mg/dL   Total Protein 7.5 6.0 - 8.5 g/dL   Albumin 4.3 3.8 - 4.9 g/dL   Globulin, Total 3.2 1.5 - 4.5 g/dL   Bilirubin Total 0.6 0.0 - 1.2 mg/dL   Alkaline Phosphatase 141 (H) 49 - 135 IU/L   AST 15 0 - 40 IU/L   ALT 14 0 - 32 IU/L      Assessment & Plan:  Referral sent to bariatrics Referral sent to GI Continue current medications  Problem List Items Addressed This Visit       Other   H/O gastric bypass   Relevant Orders   Ambulatory referral to General Surgery   Body mass index (BMI) 45.0-49.9, adult (HCC)   Relevant Orders   Ambulatory referral to General Surgery   Mixed hyperlipidemia   Relevant Medications   rosuvastatin  (CRESTOR ) 20 MG tablet   Prediabetes - Primary   Weight loss counseling, encounter for   Relevant Orders   Ambulatory referral to General Surgery   Other Visit Diagnoses       Full incontinence of feces       Relevant Orders   Ambulatory referral to Gastroenterology       Return in about 4 months (around 04/22/2025) for fasting lab work prior.   Total time spent: 25 minutes. This time includes review of previous notes and results and patient face to face interaction during today's visit.    Jeoffrey Pollen, NP  12/22/2024   This document may have been prepared by [facility] Voice Recognition software and as such may include unintentional dictation errors.     [1]  Allergies Allergen Reactions   Other Itching and Rash    Cat Dander  Fish Allergy Rash   Penicillins Itching and Rash    TOLERATED CEFAZOLIN    Shellfish Allergy Rash  [2]  Outpatient Medications Prior to Visit  Medication Sig   Cholecalciferol  (VITAMIN D3) 125 MCG (5000 UT) CAPS Take 1 capsule (5,000 Units total) by mouth daily.   gabapentin  (NEURONTIN ) 300 MG  capsule Take 1 capsule (300 mg total) by mouth 3 (three) times daily.   meloxicam  (MOBIC ) 15 MG tablet Take 1 tablet (15 mg total) by mouth daily.   Ubrogepant  (UBRELVY ) 100 MG TABS Take 1 tablet (100 mg total) by mouth as needed.   [DISCONTINUED] cyclobenzaprine  (FLEXERIL ) 10 MG tablet TAKE 1 TABLET(10 MG) BY MOUTH THREE TIMES DAILY   [DISCONTINUED] rosuvastatin  (CRESTOR ) 20 MG tablet Take 1 tablet (20 mg total) by mouth daily.   [DISCONTINUED] Naltrexone -buPROPion  HCl ER 8-90 MG TB12 Start 1 tablet every morning for 7 days, then 1 tablet twice daily for 7 days, then 2 tablets every morning and one in the evening (Patient not taking: Reported on 12/22/2024)   No facility-administered medications prior to visit.   "

## 2024-12-22 NOTE — Patient Instructions (Signed)
 [facility] [address], [address] [place],  [place]  [address]  Main: [phone]

## 2024-12-29 ENCOUNTER — Other Ambulatory Visit: Payer: Self-pay | Admitting: Podiatry

## 2025-01-05 ENCOUNTER — Other Ambulatory Visit: Payer: Self-pay

## 2025-01-06 ENCOUNTER — Other Ambulatory Visit: Payer: Self-pay | Admitting: Cardiology

## 2025-01-06 ENCOUNTER — Ambulatory Visit: Admitting: Family Medicine

## 2025-01-06 DIAGNOSIS — Z9884 Bariatric surgery status: Secondary | ICD-10-CM

## 2025-01-06 DIAGNOSIS — Z713 Dietary counseling and surveillance: Secondary | ICD-10-CM

## 2025-01-06 DIAGNOSIS — Z6841 Body Mass Index (BMI) 40.0 and over, adult: Secondary | ICD-10-CM

## 2025-01-20 ENCOUNTER — Encounter: Payer: Self-pay | Admitting: Cardiology

## 2025-01-22 ENCOUNTER — Other Ambulatory Visit: Payer: Self-pay | Admitting: Surgery

## 2025-01-22 ENCOUNTER — Ambulatory Visit: Admission: RE | Admit: 2025-01-22 | Discharge: 2025-01-22 | Disposition: A | Attending: Surgery | Admitting: Surgery

## 2025-01-22 ENCOUNTER — Telehealth: Payer: Self-pay

## 2025-01-22 ENCOUNTER — Ambulatory Visit

## 2025-01-22 ENCOUNTER — Ambulatory Visit
Admission: RE | Admit: 2025-01-22 | Discharge: 2025-01-22 | Disposition: A | Source: Ambulatory Visit | Attending: Surgery | Admitting: Surgery

## 2025-01-22 DIAGNOSIS — Z01818 Encounter for other preprocedural examination: Secondary | ICD-10-CM

## 2025-01-22 DIAGNOSIS — Z01811 Encounter for preprocedural respiratory examination: Secondary | ICD-10-CM | POA: Diagnosis present

## 2025-01-22 DIAGNOSIS — R1011 Right upper quadrant pain: Secondary | ICD-10-CM

## 2025-01-22 NOTE — Telephone Encounter (Signed)
 An attempt was made to contact the patient to reschedule her appointment time for 02/04/25. The patient did not answer, and a voicemail message was left requesting a return call to reschedule.

## 2025-02-01 ENCOUNTER — Ambulatory Visit: Admitting: Cardiovascular Disease

## 2025-02-01 ENCOUNTER — Ambulatory Visit

## 2025-02-02 ENCOUNTER — Other Ambulatory Visit: Payer: Self-pay

## 2025-02-03 ENCOUNTER — Ambulatory Visit

## 2025-02-03 ENCOUNTER — Telehealth: Payer: Self-pay

## 2025-02-03 ENCOUNTER — Other Ambulatory Visit: Payer: Self-pay

## 2025-02-03 DIAGNOSIS — J301 Allergic rhinitis due to pollen: Secondary | ICD-10-CM

## 2025-02-03 MED ORDER — LEVOCETIRIZINE DIHYDROCHLORIDE 5 MG PO TABS: 5.0000 mg | ORAL_TABLET | Freq: Every evening | ORAL | 1 refills | Status: AC

## 2025-02-04 ENCOUNTER — Ambulatory Visit: Admitting: Family Medicine

## 2025-02-05 ENCOUNTER — Other Ambulatory Visit: Payer: Self-pay | Admitting: Cardiology

## 2025-02-05 DIAGNOSIS — Z01818 Encounter for other preprocedural examination: Secondary | ICD-10-CM

## 2025-02-05 DIAGNOSIS — R9431 Abnormal electrocardiogram [ECG] [EKG]: Secondary | ICD-10-CM

## 2025-02-09 ENCOUNTER — Other Ambulatory Visit

## 2025-02-10 ENCOUNTER — Ambulatory Visit

## 2025-02-18 ENCOUNTER — Ambulatory Visit: Admitting: Family Medicine

## 2025-04-29 ENCOUNTER — Ambulatory Visit: Admitting: Cardiology
